# Patient Record
Sex: Male | Born: 1990 | Race: White | Hispanic: No | Marital: Single | Smoking: Current every day smoker
Health system: Southern US, Community
[De-identification: ages and names within clinical notes are randomized; demographics above are authoritative.]

## PROBLEM LIST (undated history)

## (undated) ENCOUNTER — Emergency Department (HOSPITAL_COMMUNITY): Discharge status: Absent without leave | Payer: Medicare Other

## (undated) DIAGNOSIS — F840 Autistic disorder: Secondary | ICD-10-CM

## (undated) DIAGNOSIS — F319 Bipolar disorder, unspecified: Secondary | ICD-10-CM

---

## 2016-07-05 ENCOUNTER — Encounter (HOSPITAL_COMMUNITY): Payer: Self-pay | Admitting: Nurse Practitioner

## 2016-07-05 ENCOUNTER — Emergency Department (HOSPITAL_COMMUNITY)
Admission: EM | Admit: 2016-07-05 | Discharge: 2016-07-05 | Disposition: A | Discharge status: Home / Self Care | Payer: Medicare Other | Attending: Emergency Medicine | Admitting: Emergency Medicine

## 2016-07-05 DIAGNOSIS — R4689 Other symptoms and signs involving appearance and behavior: Secondary | ICD-10-CM

## 2016-07-05 DIAGNOSIS — Z5181 Encounter for therapeutic drug level monitoring: Secondary | ICD-10-CM | POA: Diagnosis not present

## 2016-07-05 DIAGNOSIS — F918 Other conduct disorders: Secondary | ICD-10-CM | POA: Diagnosis present

## 2016-07-05 DIAGNOSIS — F840 Autistic disorder: Secondary | ICD-10-CM | POA: Insufficient documentation

## 2016-07-05 DIAGNOSIS — Z87891 Personal history of nicotine dependence: Secondary | ICD-10-CM | POA: Diagnosis not present

## 2016-07-05 HISTORY — DX: Autistic disorder: F84.0

## 2016-07-05 HISTORY — DX: Bipolar disorder, unspecified: F31.9

## 2016-07-05 LAB — COMPREHENSIVE METABOLIC PANEL
ALT: 56 U/L (ref 17–63)
AST: 56 U/L — ABNORMAL HIGH (ref 15–41)
Albumin: 4.9 g/dL (ref 3.5–5.0)
Alkaline Phosphatase: 72 U/L (ref 38–126)
Anion gap: 9 (ref 5–15)
BUN: 10 mg/dL (ref 6–20)
CHLORIDE: 102 mmol/L (ref 101–111)
CO2: 28 mmol/L (ref 22–32)
CREATININE: 0.89 mg/dL (ref 0.61–1.24)
Calcium: 9.4 mg/dL (ref 8.9–10.3)
Glucose, Bld: 71 mg/dL (ref 65–99)
Potassium: 3.6 mmol/L (ref 3.5–5.1)
Sodium: 139 mmol/L (ref 135–145)
Total Bilirubin: 0.4 mg/dL (ref 0.3–1.2)
Total Protein: 8.1 g/dL (ref 6.5–8.1)

## 2016-07-05 LAB — RAPID URINE DRUG SCREEN, HOSP PERFORMED
AMPHETAMINES: NOT DETECTED
BENZODIAZEPINES: NOT DETECTED
Barbiturates: NOT DETECTED
COCAINE: NOT DETECTED
OPIATES: NOT DETECTED
Tetrahydrocannabinol: NOT DETECTED

## 2016-07-05 LAB — CBC WITH DIFFERENTIAL/PLATELET
BASOS ABS: 0 10*3/uL (ref 0.0–0.1)
BASOS PCT: 0 %
EOS ABS: 0.1 10*3/uL (ref 0.0–0.7)
EOS PCT: 1 %
HCT: 41.2 % (ref 39.0–52.0)
Hemoglobin: 14.1 g/dL (ref 13.0–17.0)
LYMPHS PCT: 27 %
Lymphs Abs: 1.9 10*3/uL (ref 0.7–4.0)
MCH: 29.6 pg (ref 26.0–34.0)
MCHC: 34.2 g/dL (ref 30.0–36.0)
MCV: 86.4 fL (ref 78.0–100.0)
Monocytes Absolute: 0.5 10*3/uL (ref 0.1–1.0)
Monocytes Relative: 7 %
Neutro Abs: 4.5 10*3/uL (ref 1.7–7.7)
Neutrophils Relative %: 65 %
PLATELETS: 353 10*3/uL (ref 150–400)
RBC: 4.77 MIL/uL (ref 4.22–5.81)
RDW: 11.9 % (ref 11.5–15.5)
WBC: 7 10*3/uL (ref 4.0–10.5)

## 2016-07-05 LAB — ETHANOL

## 2016-07-05 NOTE — ED Notes (Signed)
Social work will call group home to see if pt can return there, if he can he will go home. If he can not return to the group home, pt will be reevaluated in the morning
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
## 2016-07-05 NOTE — ED Notes (Signed)
TTS at bedside. 
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
## 2016-07-05 NOTE — Progress Notes (Addendum)
CSW called and spoke to Alexander Fritz at  [phone].  Alexander Fritz is owner/operator at pt's group home [facility] at [address] [place], [place] [id number].  CSW informed Alexander Fritz pt is being D/C'd and after Alexander Fritz re-affirmed the pt's behavior at the home before being admitted, per the notes, Alexander Fritz agreed to pick pt up from the ED before 11pm today, 2/14.  CSW then called the legal guardian DSS agent Lovenia ShuckKim Kelly to update her at ph: [phone].  Legal guardian asked for ED TCU's nurse's phone and thanked the CSW.  CSW will inform the ED TCU RN of the pt's group homes planned arrival.  Please reconsult if future social work needs arise.  7:11 PM CSW called pt's group home manager back, informed him pt's RN has his number and is expecting him before 11pm to pick up the pt.  CSW called ED TCU to let them know pt's contacts number were in this progress note.  CSW signing off.   Dorothe PeaJonathan F. Arthella Fritz, Theresia MajorsLCSWA, LCAS Clinical Social Worker Ph: [phone]

## 2016-07-05 NOTE — Discharge Instructions (Signed)
Please follow-up with your primary care physician as previously scheduled. Please follow-up with your mental health team as directed. If any symptoms worsen, or he began having any thoughts of hurting herself or others, please return to the nearest emergency department.

## 2016-07-05 NOTE — ED Notes (Signed)
Instructions to follow up with PCP and with Mental Health Care team discussed with pt; pt verbalized understanding; pt denies thoughts of harming self or others and understands if these thoughts develop to return to the emergency department; no acute distress noted; staff member from Group Home in lobby to pick up pt

## 2016-07-05 NOTE — Progress Notes (Deleted)
CSW is still assessing to determine pt's group home or family care home.  Pt stated the name was "Alexander Fritz".
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
## 2016-08-25 ENCOUNTER — Emergency Department (HOSPITAL_COMMUNITY)
Admission: EM | Admit: 2016-08-25 | Discharge: 2016-08-26 | Disposition: A | Discharge status: Home / Self Care | Payer: Medicare Other | Attending: Emergency Medicine | Admitting: Emergency Medicine

## 2016-08-25 DIAGNOSIS — F23 Brief psychotic disorder: Secondary | ICD-10-CM | POA: Diagnosis not present

## 2016-08-25 DIAGNOSIS — Z87891 Personal history of nicotine dependence: Secondary | ICD-10-CM | POA: Insufficient documentation

## 2016-08-25 DIAGNOSIS — F319 Bipolar disorder, unspecified: Secondary | ICD-10-CM | POA: Diagnosis not present

## 2016-08-25 DIAGNOSIS — Z79899 Other long term (current) drug therapy: Secondary | ICD-10-CM | POA: Insufficient documentation

## 2016-08-25 DIAGNOSIS — F22 Delusional disorders: Secondary | ICD-10-CM | POA: Diagnosis not present

## 2016-08-25 DIAGNOSIS — R451 Restlessness and agitation: Secondary | ICD-10-CM | POA: Diagnosis present

## 2016-08-25 DIAGNOSIS — E860 Dehydration: Secondary | ICD-10-CM

## 2016-08-25 DIAGNOSIS — F840 Autistic disorder: Secondary | ICD-10-CM | POA: Diagnosis not present

## 2016-08-25 LAB — BASIC METABOLIC PANEL
ANION GAP: 19 — AB (ref 5–15)
BUN: 14 mg/dL (ref 6–20)
CALCIUM: 9.5 mg/dL (ref 8.9–10.3)
CO2: 18 mmol/L — ABNORMAL LOW (ref 22–32)
Chloride: 97 mmol/L — ABNORMAL LOW (ref 101–111)
Creatinine, Ser: 1.07 mg/dL (ref 0.61–1.24)
GLUCOSE: 67 mg/dL (ref 65–99)
POTASSIUM: 3.8 mmol/L (ref 3.5–5.1)
Sodium: 134 mmol/L — ABNORMAL LOW (ref 135–145)

## 2016-08-25 LAB — CBC
HEMATOCRIT: 44.6 % (ref 39.0–52.0)
Hemoglobin: 16 g/dL (ref 13.0–17.0)
MCH: 29.4 pg (ref 26.0–34.0)
MCHC: 35.9 g/dL (ref 30.0–36.0)
MCV: 82 fL (ref 78.0–100.0)
PLATELETS: 369 10*3/uL (ref 150–400)
RBC: 5.44 MIL/uL (ref 4.22–5.81)
RDW: 12.2 % (ref 11.5–15.5)
WBC: 7.3 10*3/uL (ref 4.0–10.5)

## 2016-08-25 LAB — RAPID URINE DRUG SCREEN, HOSP PERFORMED
AMPHETAMINES: NOT DETECTED
BENZODIAZEPINES: NOT DETECTED
Barbiturates: NOT DETECTED
COCAINE: NOT DETECTED
Opiates: NOT DETECTED
Tetrahydrocannabinol: NOT DETECTED

## 2016-08-25 MED ORDER — CARBAMAZEPINE 200 MG PO TABS: 200.0000 mg | ORAL_TABLET | Freq: Two times a day (BID) | ORAL | Status: DC

## 2016-08-25 MED ORDER — SODIUM CHLORIDE 0.9 % IV BOLUS (SEPSIS)
1000.0000 mL | Freq: Once | INTRAVENOUS | Status: AC
  Administered 2016-08-25: 1000 mL via INTRAVENOUS

## 2016-08-25 MED ORDER — RISPERIDONE 2 MG PO TABS: 4.0000 mg | ORAL_TABLET | Freq: Every day | ORAL | Status: DC

## 2016-08-25 MED ORDER — DIVALPROEX SODIUM ER 500 MG PO TB24: 500.0000 mg | ORAL_TABLET | Freq: Two times a day (BID) | ORAL | Status: DC

## 2016-08-25 MED ORDER — TRIHEXYPHENIDYL HCL 5 MG PO TABS
5.0000 mg | ORAL_TABLET | Freq: Two times a day (BID) | ORAL | Status: DC
  Filled 2016-08-25: qty 1
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
## 2016-08-25 NOTE — ED Provider Notes (Signed)
[facility]-EMERGENCY DEPT Provider Note   CSN: [id number] Arrival date & time: 08/25/16  1708     History   Chief Complaint Chief Complaint  Patient presents with  . Medical Clearance  . IVC    HPI Amiir Fritz is a 26 y.o. male.  Patient from group home, hx autism, bipolar disorder, presents with intermittent agitated behavior, say he is the devil, voicing thoughts of harm to self/others, and that he is not taking meds or eating for past week.  Patient is very limited historian, not answering questions - level 5 caveat.    The history is provided by the patient and the police. The history is limited by the condition of the patient.    Past Medical History:  Diagnosis Date  . Autism   . Bipolar 1 disorder (HCC)     There are no active problems to display for this patient.   No past surgical history on file.     Home Medications    Prior to Admission medications   Medication Sig Start Date End Date Taking? Authorizing Provider  carbamazepine (TEGRETOL) 200 MG tablet Take 200 mg by mouth 2 (two) times daily.    Historical Provider, MD  cetirizine (ZYRTEC) 10 MG tablet Take 10 mg by mouth daily.    Historical Provider, MD  divalproex (DEPAKOTE ER) 500 MG 24 hr tablet Take 500 mg by mouth 2 (two) times daily.    Historical Provider, MD  docusate sodium (COLACE) 100 MG capsule Take 100 mg by mouth daily.    Historical Provider, MD  risperidone (RISPERDAL) 4 MG tablet Take 4 mg by mouth at bedtime.    Historical Provider, MD  trihexyphenidyl (ARTANE) 5 MG tablet Take 5 mg by mouth 2 (two) times daily with breakfast and lunch.    Historical Provider, MD  vitamin B-12 (CYANOCOBALAMIN) 1000 MCG tablet Take 1,000 mcg by mouth at bedtime.    Historical Provider, MD    Family History No family history on file.  Social History Social History  Substance Use Topics  . Smoking status: Former Smoker    Packs/day: 0.50    Years: 5.00    Types: Cigarettes    Quit date: 06/23/2015  .  Smokeless tobacco: Former Neurosurgeon  . Alcohol use No     Allergies   Lithium   Review of Systems Review of Systems  Unable to perform ROS: Psychiatric disorder  level 5 caveat, not answering questions, psych disorder.    Physical Exam Updated Vital Signs BP (!) 119/55 (BP Location: Right Arm)   Pulse (!) 142   Temp 98.4 F (36.9 C) (Oral)   Resp (!) 22   Ht  (1.753 m)   Wt 59 kg   SpO2 100%   BMI 19.20 kg/m   Physical Exam  Constitutional: He appears well-developed and well-nourished. No distress.  HENT:  Head: Atraumatic.  Mouth/Throat: Oropharynx is clear and moist.  Eyes: Conjunctivae are normal. Pupils are equal, round, and reactive to light.  Neck: Neck supple. No tracheal deviation present. No thyromegaly present.  Cardiovascular: Normal rate, regular rhythm, normal heart sounds and intact distal pulses.  Exam reveals no gallop and no friction rub.   No murmur heard. Pulmonary/Chest: Effort normal and breath sounds normal. No accessory muscle usage. No respiratory distress.  Abdominal: Soft. Bowel sounds are normal. He exhibits no distension. There is no tenderness.  Musculoskeletal: He exhibits no edema.  Neurological: He is alert.  Speech clear. Ambulates w steady gait. Moves  bil extremities purposefully w good strength.   Skin: Skin is warm and dry. He is not diaphoretic.  Psychiatric: He has a normal mood and affect.  Nursing note and vitals reviewed.    ED Treatments / Results  Labs (all labs ordered are listed, but only abnormal results are displayed) Results for orders placed or performed during the hospital encounter of 08/25/16  CBC  Result Value Ref Range   WBC 7.3 4.0 - 10.5 K/uL   RBC 5.44 4.22 - 5.81 MIL/uL   Hemoglobin 16.0 13.0 - 17.0 g/dL   HCT 40.9 81.1 - 91.4 %   MCV 82.0 78.0 - 100.0 fL   MCH 29.4 26.0 - 34.0 pg   MCHC 35.9 30.0 - 36.0 g/dL   RDW 78.2 95.6 - 21.3 %   Platelets 369 150 - 400 K/uL  Basic metabolic panel  Result  Value Ref Range   Sodium 134 (L) 135 - 145 mmol/L   Potassium 3.8 3.5 - 5.1 mmol/L   Chloride 97 (L) 101 - 111 mmol/L   CO2 18 (L) 22 - 32 mmol/L   Glucose, Bld 67 65 - 99 mg/dL   BUN 14 6 - 20 mg/dL   Creatinine, Ser 0.86 0.61 - 1.24 mg/dL   Calcium 9.5 8.9 - 57.8 mg/dL   GFR calc non Af Amer >60 >60 mL/min   GFR calc Af Amer >60 >60 mL/min   Anion gap 19 (H) 5 - 15  Rapid urine drug screen (hospital performed)  Result Value Ref Range   Opiates NONE DETECTED NONE DETECTED   Cocaine NONE DETECTED NONE DETECTED   Benzodiazepines NONE DETECTED NONE DETECTED   Amphetamines NONE DETECTED NONE DETECTED   Tetrahydrocannabinol NONE DETECTED NONE DETECTED   Barbiturates NONE DETECTED NONE DETECTED    EKG  EKG Interpretation None       Radiology No results found.  Procedures Procedures (including critical care time)  Medications Ordered in ED Medications - No data to display   Initial Impression / Assessment and Plan / ED Course  I have reviewed the triage vital signs and the nursing notes.  Pertinent labs & imaging results that were available during my care of the patient were reviewed by me and considered in my medical decision making (see chart for details).  Labs sent.   [facility] team consulted.  Reviewed nursing notes and prior charts for additional history.   receck poor po intake. hco3 18. Tachy.  Iv ns bolus.   [facility] team recommends inpatient psych tx.   Recheck pt, no new c/o.     Final Clinical Impressions(s) / ED Diagnoses   Final diagnoses:  None    New Prescriptions New Prescriptions   No medications on file     Cathren Laine, MD 08/25/16 2038
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
## 2016-08-26 DIAGNOSIS — Z79899 Other long term (current) drug therapy: Secondary | ICD-10-CM | POA: Diagnosis not present

## 2016-08-26 DIAGNOSIS — Z87891 Personal history of nicotine dependence: Secondary | ICD-10-CM | POA: Diagnosis not present

## 2016-08-26 DIAGNOSIS — F840 Autistic disorder: Secondary | ICD-10-CM | POA: Diagnosis present

## 2016-08-26 DIAGNOSIS — F319 Bipolar disorder, unspecified: Secondary | ICD-10-CM | POA: Diagnosis not present

## 2016-08-26 MED ORDER — RISPERIDONE 2 MG PO TABS: 2.0000 mg | ORAL_TABLET | Freq: Two times a day (BID) | ORAL | Status: DC

## 2016-08-26 MED ORDER — RISPERIDONE 2 MG PO TABS: 2.0000 mg | ORAL_TABLET | Freq: Two times a day (BID) | ORAL | 0 refills | Status: DC

## 2016-08-26 NOTE — Progress Notes (Signed)
CSW contacted by patient's Guardian [name]). CSW informed patient's guardian that patient was psychiatrically cleared and ready for discharge. CSW informed patient's guardian that CSW was unable to reach [name] patient's group home owner. Patient's guardian reported that he had the same phone number for [name] and agreed to try and contact him in regards to patient's discharge.  CSW contacted [name], [name] reported that one of his staff members would be picking patient up by 6:30pm today 08/26/2016.   Celso Sickle, LCSWA [facility]  Clinical Social Worker [phone]

## 2016-08-26 NOTE — [facility]HH Suicide Risk Assessment (Signed)
Suicide Risk Assessment  Discharge Assessment   [facility] Discharge Suicide Risk Assessment   Principal Problem: Bipolar 1 disorder [facility]) Discharge Diagnoses:  Patient Active Problem List   Diagnosis Date Noted  . Autism spectrum disorder [F84.0] 08/26/2016    Priority: High  . Bipolar 1 disorder (HCC) [F31.9] 08/26/2016    Priority: High    Total Time spent with patient: 45 minutes  Musculoskeletal: Strength & Muscle Tone: within normal limits Gait & Station: normal Patient leans: N/A  Psychiatric Specialty Exam: Physical Exam  Constitutional: He is oriented to person, place, and time. He appears well-developed and well-nourished.  HENT:  Head: Normocephalic.  Neck: Normal range of motion.  Respiratory: Effort normal.  Musculoskeletal: Normal range of motion.  Neurological: He is alert and oriented to person, place, and time.  Psychiatric: He has a normal mood and affect. His speech is normal and behavior is normal. Judgment and thought content normal. Cognition and memory are normal.    Review of Systems  All other systems reviewed and are negative.   Blood pressure (!) 105/58, pulse 83, temperature 97.8 F (36.6 C), temperature source Oral, resp. rate 14, height  (1.753 m), weight 59 kg (130 lb), SpO2 97 %.Body mass index is 19.2 kg/m.  General Appearance: Casual  Eye Contact:  Good  Speech:  Normal Rate  Volume:  Normal  Mood:  Euthymic  Affect:  Blunt  Thought Process:  Coherent and Descriptions of Associations: Intact  Orientation:  Full (Time, Place, and Person)  Thought Content:  WDL and Logical  Suicidal Thoughts:  No  Homicidal Thoughts:  No  Memory:  Immediate;   Good Recent;   Good Remote;   Good  Judgement:  Fair  Insight:  Fair  Psychomotor Activity:  Normal  Concentration:  Concentration: Good and Attention Span: Good  Recall:  Good  Fund of Knowledge:  Fair  Language:  Good  Akathisia:  No  Handed:  Right  AIMS (if indicated):     Assets:   Leisure Time Physical Health Resilience Social Support  ADL's:  Intact  Cognition:  WNL  Sleep:      Mental Status Per Nursing Assessment::   On Admission:   aggression at his group home  Demographic Factors:  Male, Adolescent or young adult and Caucasian  Loss Factors: NA  Historical Factors: NA  Risk Reduction Factors:   Sense of responsibility to family, Living with another person, especially a relative and Positive social support  Continued Clinical Symptoms:  None  Cognitive Features That Contribute To Risk:  None    Suicide Risk:  Minimal: No identifiable suicidal ideation.  Patients presenting with no risk factors but with morbid ruminations; may be classified as minimal risk based on the severity of the depressive symptoms    Plan Of Care/Follow-up recommendations:  Activity:  as tolerated Diet:  heart healthy diet  LORD, JAMISON, NP 08/26/2016, 11:19 AM

## 2016-08-26 NOTE — Progress Notes (Signed)
CSW contacted patient's Group Home Owner ([name] [phone]), no answer. CSW left voicemail requesting return phone call.  CSW contacted patient's Guardian [name] [phone]), no answer. CSW left voicemail requesting return phone call.   CSW will continue to try and make contact with patient's group home to discuss patient's dc and transportation.   Celso Sickle, LCSWA [facility]  Clinical Social Worker [phone]

## 2016-08-26 NOTE — BHH Counselor (Signed)
1842:  Attempted to contact Group Home Owner [name] to find out time of pick up for Patient.  [name] reported earlier that he will pick up Patient by 1830 today.  A HIPPA compliant voicemail message was left requesting a return telephone call.

## 2016-08-26 NOTE — Progress Notes (Signed)
CSW filed patient's examination and recommendation paperwork into IVC logbook.  Maggy Wyble, LCSWA [facility]  Clinical Social Worker [phone] 
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
## 2016-09-15 ENCOUNTER — Emergency Department (HOSPITAL_COMMUNITY)
Admission: EM | Admit: 2016-09-15 | Discharge: 2016-09-18 | Disposition: A | Discharge status: Home / Self Care | Payer: Medicare Other | Attending: Emergency Medicine | Admitting: Emergency Medicine

## 2016-09-15 ENCOUNTER — Encounter (HOSPITAL_COMMUNITY): Payer: Self-pay | Admitting: Emergency Medicine

## 2016-09-15 DIAGNOSIS — F840 Autistic disorder: Secondary | ICD-10-CM | POA: Diagnosis present

## 2016-09-15 DIAGNOSIS — F3110 Bipolar disorder, current episode manic without psychotic features, unspecified: Secondary | ICD-10-CM | POA: Diagnosis not present

## 2016-09-15 DIAGNOSIS — Z87891 Personal history of nicotine dependence: Secondary | ICD-10-CM | POA: Insufficient documentation

## 2016-09-15 DIAGNOSIS — F319 Bipolar disorder, unspecified: Secondary | ICD-10-CM | POA: Diagnosis present

## 2016-09-15 DIAGNOSIS — Z79899 Other long term (current) drug therapy: Secondary | ICD-10-CM | POA: Diagnosis not present

## 2016-09-15 DIAGNOSIS — R45850 Homicidal ideations: Secondary | ICD-10-CM | POA: Diagnosis present

## 2016-09-15 LAB — CBC
HEMATOCRIT: 43.5 % (ref 39.0–52.0)
HEMOGLOBIN: 15.2 g/dL (ref 13.0–17.0)
MCH: 29.3 pg (ref 26.0–34.0)
MCHC: 34.9 g/dL (ref 30.0–36.0)
MCV: 83.8 fL (ref 78.0–100.0)
Platelets: 352 10*3/uL (ref 150–400)
RBC: 5.19 MIL/uL (ref 4.22–5.81)
RDW: 12.4 % (ref 11.5–15.5)
WBC: 4.8 10*3/uL (ref 4.0–10.5)

## 2016-09-15 LAB — COMPREHENSIVE METABOLIC PANEL
ALBUMIN: 5.1 g/dL — AB (ref 3.5–5.0)
ALK PHOS: 76 U/L (ref 38–126)
ALT: 18 U/L (ref 17–63)
ANION GAP: 12 (ref 5–15)
AST: 20 U/L (ref 15–41)
BUN: 11 mg/dL (ref 6–20)
CHLORIDE: 101 mmol/L (ref 101–111)
CO2: 25 mmol/L (ref 22–32)
Calcium: 9.7 mg/dL (ref 8.9–10.3)
Creatinine, Ser: 0.81 mg/dL (ref 0.61–1.24)
GFR calc Af Amer: >60 mL/min (ref >60)
GFR calc non Af Amer: >60 mL/min (ref >60)
GLUCOSE: 98 mg/dL (ref 65–99)
Potassium: 3.6 mmol/L (ref 3.5–5.1)
SODIUM: 138 mmol/L (ref 135–145)
Total Bilirubin: 0.9 mg/dL (ref 0.3–1.2)
Total Protein: 8.3 g/dL — ABNORMAL HIGH (ref 6.5–8.1)

## 2016-09-15 LAB — VALPROIC ACID LEVEL

## 2016-09-15 LAB — SALICYLATE LEVEL

## 2016-09-15 LAB — ACETAMINOPHEN LEVEL

## 2016-09-15 LAB — ETHANOL: Alcohol, Ethyl (B): <5 mg/dL (ref <5)

## 2016-09-15 MED ORDER — VITAMIN B-12 1000 MCG PO TABS
1000.0000 ug | ORAL_TABLET | Freq: Every day | ORAL | Status: DC
  Filled 2016-09-15 (×4): qty 1

## 2016-09-15 MED ORDER — RISPERIDONE 2 MG PO TABS
4.0000 mg | ORAL_TABLET | Freq: Every day | ORAL | Status: DC
  Filled 2016-09-15: qty 2

## 2016-09-15 MED ORDER — DIVALPROEX SODIUM ER 500 MG PO TB24
500.0000 mg | ORAL_TABLET | Freq: Two times a day (BID) | ORAL | Status: DC
  Filled 2016-09-15: qty 1

## 2016-09-15 MED ORDER — LORATADINE 10 MG PO TABS
10.0000 mg | ORAL_TABLET | Freq: Every day | ORAL | Status: DC
  Filled 2016-09-15 (×4): qty 1

## 2016-09-15 MED ORDER — RISPERIDONE 2 MG PO TABS
2.0000 mg | ORAL_TABLET | Freq: Two times a day (BID) | ORAL | Status: DC
  Filled 2016-09-15 (×2): qty 1

## 2016-09-15 MED ORDER — TRIHEXYPHENIDYL HCL 5 MG PO TABS
5.0000 mg | ORAL_TABLET | Freq: Two times a day (BID) | ORAL | Status: DC
  Filled 2016-09-15: qty 1

## 2016-09-15 MED ORDER — CARBAMAZEPINE 200 MG PO TABS
200.0000 mg | ORAL_TABLET | Freq: Two times a day (BID) | ORAL | Status: DC
  Filled 2016-09-15 (×2): qty 1
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
## 2016-09-15 NOTE — ED Notes (Signed)
Bed: [facility] Expected date:  Expected time:  Means of arrival:  Comments: 

## 2016-09-16 ENCOUNTER — Encounter (HOSPITAL_COMMUNITY): Payer: Self-pay | Admitting: Registered Nurse

## 2016-09-16 DIAGNOSIS — F3110 Bipolar disorder, current episode manic without psychotic features, unspecified: Secondary | ICD-10-CM | POA: Diagnosis not present

## 2016-09-16 DIAGNOSIS — F840 Autistic disorder: Secondary | ICD-10-CM | POA: Diagnosis not present

## 2016-09-16 DIAGNOSIS — Z87891 Personal history of nicotine dependence: Secondary | ICD-10-CM | POA: Diagnosis not present

## 2016-09-16 DIAGNOSIS — F319 Bipolar disorder, unspecified: Secondary | ICD-10-CM

## 2016-09-16 MED ORDER — DIPHENHYDRAMINE HCL 50 MG/ML IJ SOLN
25.0000 mg | Freq: Once | INTRAMUSCULAR | Status: AC
  Administered 2016-09-16: 25 mg via INTRAMUSCULAR
  Filled 2016-09-16: qty 1
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
## 2016-09-16 NOTE — ED Notes (Signed)
Patient with complaints of anxiety and is requesting to receive Benadryl IM. MD aware; orders completed. Pt tolerated injection well; no distress noted.

## 2016-09-17 DIAGNOSIS — F319 Bipolar disorder, unspecified: Secondary | ICD-10-CM | POA: Diagnosis not present

## 2016-09-17 DIAGNOSIS — Z87891 Personal history of nicotine dependence: Secondary | ICD-10-CM | POA: Diagnosis not present

## 2016-09-17 DIAGNOSIS — F840 Autistic disorder: Secondary | ICD-10-CM | POA: Diagnosis not present

## 2016-09-17 DIAGNOSIS — F3110 Bipolar disorder, current episode manic without psychotic features, unspecified: Secondary | ICD-10-CM | POA: Diagnosis not present

## 2016-09-17 LAB — LIPID PANEL
CHOLESTEROL: 133 mg/dL (ref 0–200)
HDL: 40 mg/dL — ABNORMAL LOW (ref >40)
LDL Cholesterol: 71 mg/dL (ref 0–99)
Total CHOL/HDL Ratio: 3.3 RATIO
Triglycerides: 112 mg/dL (ref <150)
VLDL: 22 mg/dL (ref 0–40)
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
## 2016-09-17 NOTE — ED Notes (Signed)
Patient resting in bed watching television at this time. No signs of distress noted currently.

## 2016-09-18 DIAGNOSIS — Z87891 Personal history of nicotine dependence: Secondary | ICD-10-CM | POA: Diagnosis not present

## 2016-09-18 DIAGNOSIS — F840 Autistic disorder: Secondary | ICD-10-CM | POA: Diagnosis not present

## 2016-09-18 DIAGNOSIS — F319 Bipolar disorder, unspecified: Secondary | ICD-10-CM | POA: Diagnosis not present

## 2016-09-18 LAB — HEMOGLOBIN A1C
HEMOGLOBIN A1C: 4.6 % — AB (ref 4.8–5.6)
MEAN PLASMA GLUCOSE: 85 mg/dL

## 2016-09-18 LAB — PROLACTIN: Prolactin: 5.8 ng/mL (ref 4.0–15.2)

## 2016-09-18 MED ORDER — CARBAMAZEPINE 200 MG PO TABS: 200.0000 mg | ORAL_TABLET | Freq: Two times a day (BID) | ORAL | 0 refills | Status: DC

## 2016-09-18 MED ORDER — TRIHEXYPHENIDYL HCL 5 MG PO TABS: 5.0000 mg | ORAL_TABLET | Freq: Two times a day (BID) | ORAL | 0 refills | Status: DC

## 2016-09-18 MED ORDER — RISPERIDONE 2 MG PO TABS: 2.0000 mg | ORAL_TABLET | Freq: Two times a day (BID) | ORAL | 0 refills | Status: DC

## 2016-09-18 MED ORDER — LORAZEPAM 1 MG PO TABS
1.0000 mg | ORAL_TABLET | Freq: Once | ORAL | Status: DC
  Filled 2016-09-18: qty 1

## 2016-09-18 NOTE — ED Notes (Signed)
Patient has previously refused all medications.  Patient was asked this morning if he wanted to take any of his medications and he continues to refuse.

## 2016-09-18 NOTE — Progress Notes (Signed)
CSW spokw with patients group home owner, [name] of [facility] of Care at [phone], regarding patient returning to group home today 4/30 at discharge. CSW updated [name] that patient is medically/psych cleared at this time. [name] stated patient is able to return to group home and employee by the name of Gregary Signs will pick up patient around 4:30PM this afternoon. [name] asked CSW to inform patient that he would be returning to group home. CSW will update patient and legal guardian.   Stacy Gardner, LCSWA Clinical Social Worker [phone]

## 2016-09-18 NOTE — Progress Notes (Signed)
CSW spoke with patient via bedside regarding discharge plans. Patient stated he did not want to go back to [facility] of Care. When CSW questioned why patient did not want to return patient stated "they have too many fights there with other people". CSW informed patient this was his only option at this time and he could make other arrangements with legal guardian once discharged. Patient stated "I am not going back there".   CSW contacted patients legal guardian, [name] [phone], to inform him that patient is refusing to DC with group home. Mr. Ledon Snare stated group home will need to pick patient up and if patient refusing at that time patient is able to discharge to homeless shelter. CSW spoke with social work Chiropodist to confirm this discharge plan. AD stated patient would need to be discharged back to group home and discharge to homeless shelter is not an option at this time.   CSW updated [name] of conversation with patient and employee of group home will still arrive around 4:30PM. CSW also updated patients guardian and requested he speak with patient to tell patient his only option is to return to group home.  CSW will update RN of plan.   Stacy Gardner, LCSWA Clinical Social Worker [phone]
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
## 2016-09-18 NOTE — ED Notes (Signed)
Pt returned to group home via group home staff. Pt escorted out of lobby, accompanied by security and hospital administrators.
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
## 2016-09-18 NOTE — ED Notes (Signed)
Pt has been agitated and emotional regarding his refusal to leave ED and return to Group Home.  This is likely cause of elevated Pulse and BP.  Will recheck when Pt has settled down.
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
## 2016-09-19 NOTE — B[facility] Suicide Risk Assessment (Addendum)
Suicide Risk Assessment  Discharge Assessment   [facility] Discharge Suicide Risk Assessment   Principal Problem: Bipolar 1 disorder [facility]) Discharge Diagnoses:  Patient Active Problem List   Diagnosis Date Noted  . Autism spectrum disorder [F84.0] 08/26/2016    Priority: High  . Bipolar 1 disorder (HCC) [F31.9] 08/26/2016    Priority: High    Total Time spent with patient: 30 minutes  Musculoskeletal: Strength & Muscle Tone: within normal limits Gait & Station: normal Patient leans: N/A  Psychiatric Specialty Exam: Physical Exam  Constitutional: He is oriented to person, place, and time. He appears well-developed and well-nourished.  HENT:  Head: Normocephalic.  Neck: Normal range of motion.  Respiratory: Effort normal.  Musculoskeletal: Normal range of motion.  Neurological: He is alert and oriented to person, place, and time.  Psychiatric: He has a normal mood and affect. His speech is normal and behavior is normal. Judgment and thought content normal. Cognition and memory are normal.    Review of Systems  Psychiatric/Behavioral: Negative.   All other systems reviewed and are negative.   Blood pressure 129/78, pulse 76, temperature 98.6 F (37 C), temperature source Oral, resp. rate 18, SpO2 100 %.There is no height or weight on file to calculate BMI.  General Appearance: Casual  Eye Contact:  Good  Speech:  Normal Rate  Volume:  Normal  Mood:  Euthymic  Affect:  Congruent  Thought Process:  Coherent and Descriptions of Associations: Intact  Orientation:  Full (Time, Place, and Person)  Thought Content:  WDL and Logical  Suicidal Thoughts:  No  Homicidal Thoughts:  No  Memory:  Immediate;   Good Recent;   Good Remote;   Good  Judgement:  Fair  Insight:  Fair  Psychomotor Activity:  Normal  Concentration:  Concentration: Good and Attention Span: Good  Recall:  Good  Fund of Knowledge:  Fair  Language:  Good  Akathisia:  No  Handed:  Right  AIMS (if indicated):      Assets:  Housing Leisure Time Physical Health Resilience Social Support  ADL's:  Intact  Cognition:  WNL  Sleep:      Mental Status Per Nursing Assessment::   On Admission:   aggression  Demographic Factors:  Male and Caucasian  Loss Factors: NA  Historical Factors: Impulsivity  Risk Reduction Factors:   Living with another person, especially a relative, Positive social support and Positive therapeutic relationship  Continued Clinical Symptoms:  None  Cognitive Features That Contribute To Risk:  None    Suicide Risk:  Minimal: No identifiable suicidal ideation.  Patients presenting with no risk factors but with morbid ruminations; may be classified as minimal risk based on the severity of the depressive symptoms    Plan Of Care/Follow-up recommendations:  Activity:  as tolerated Diet:  heart healhty diet  Diane Hanel, NP 09/19/2016, 9:40 AM

## 2016-10-04 ENCOUNTER — Emergency Department (HOSPITAL_COMMUNITY)
Admission: EM | Admit: 2016-10-04 | Discharge: 2016-10-09 | Disposition: A | Discharge status: Home / Self Care | Payer: Medicare Other | Attending: Emergency Medicine | Admitting: Emergency Medicine

## 2016-10-04 ENCOUNTER — Encounter (HOSPITAL_COMMUNITY): Payer: Self-pay | Admitting: Family Medicine

## 2016-10-04 ENCOUNTER — Emergency Department (HOSPITAL_COMMUNITY): Payer: Medicare Other

## 2016-10-04 DIAGNOSIS — F3113 Bipolar disorder, current episode manic without psychotic features, severe: Secondary | ICD-10-CM | POA: Diagnosis not present

## 2016-10-04 DIAGNOSIS — F319 Bipolar disorder, unspecified: Secondary | ICD-10-CM

## 2016-10-04 DIAGNOSIS — F3110 Bipolar disorder, current episode manic without psychotic features, unspecified: Secondary | ICD-10-CM | POA: Insufficient documentation

## 2016-10-04 DIAGNOSIS — F3112 Bipolar disorder, current episode manic without psychotic features, moderate: Secondary | ICD-10-CM

## 2016-10-04 DIAGNOSIS — F840 Autistic disorder: Secondary | ICD-10-CM | POA: Diagnosis not present

## 2016-10-04 DIAGNOSIS — R45851 Suicidal ideations: Secondary | ICD-10-CM | POA: Diagnosis not present

## 2016-10-04 DIAGNOSIS — F250 Schizoaffective disorder, bipolar type: Secondary | ICD-10-CM | POA: Diagnosis present

## 2016-10-04 DIAGNOSIS — Z87891 Personal history of nicotine dependence: Secondary | ICD-10-CM | POA: Insufficient documentation

## 2016-10-04 DIAGNOSIS — Z79899 Other long term (current) drug therapy: Secondary | ICD-10-CM | POA: Insufficient documentation

## 2016-10-04 DIAGNOSIS — F312 Bipolar disorder, current episode manic severe with psychotic features: Secondary | ICD-10-CM | POA: Diagnosis not present

## 2016-10-04 DIAGNOSIS — R45850 Homicidal ideations: Secondary | ICD-10-CM | POA: Diagnosis not present

## 2016-10-04 LAB — COMPREHENSIVE METABOLIC PANEL
ALT: 20 U/L (ref 17–63)
AST: 23 U/L (ref 15–41)
Albumin: 5.1 g/dL — ABNORMAL HIGH (ref 3.5–5.0)
Alkaline Phosphatase: 90 U/L (ref 38–126)
Anion gap: 10 (ref 5–15)
BUN: 16 mg/dL (ref 6–20)
CO2: 24 mmol/L (ref 22–32)
Calcium: 9.7 mg/dL (ref 8.9–10.3)
Chloride: 106 mmol/L (ref 101–111)
Creatinine, Ser: 0.77 mg/dL (ref 0.61–1.24)
GFR calc Af Amer: >60 mL/min (ref >60)
GFR calc non Af Amer: >60 mL/min (ref >60)
Glucose, Bld: 99 mg/dL (ref 65–99)
Potassium: 3.9 mmol/L (ref 3.5–5.1)
Sodium: 140 mmol/L (ref 135–145)
Total Bilirubin: 0.7 mg/dL (ref 0.3–1.2)
Total Protein: 8.3 g/dL — ABNORMAL HIGH (ref 6.5–8.1)

## 2016-10-04 LAB — RAPID URINE DRUG SCREEN, HOSP PERFORMED
Amphetamines: NOT DETECTED
Barbiturates: NOT DETECTED
Benzodiazepines: NOT DETECTED
Cocaine: NOT DETECTED
Opiates: NOT DETECTED
Tetrahydrocannabinol: NOT DETECTED

## 2016-10-04 LAB — SALICYLATE LEVEL: Salicylate Lvl: <7 mg/dL (ref 2.8–30.0)

## 2016-10-04 LAB — ETHANOL: Alcohol, Ethyl (B): <5 mg/dL (ref <5)

## 2016-10-04 LAB — CBC
HCT: 41.9 % (ref 39.0–52.0)
Hemoglobin: 14.5 g/dL (ref 13.0–17.0)
MCH: 29.1 pg (ref 26.0–34.0)
MCHC: 34.6 g/dL (ref 30.0–36.0)
MCV: 84.1 fL (ref 78.0–100.0)
Platelets: 360 10*3/uL (ref 150–400)
RBC: 4.98 MIL/uL (ref 4.22–5.81)
RDW: 12.4 % (ref 11.5–15.5)
WBC: 14 10*3/uL — ABNORMAL HIGH (ref 4.0–10.5)

## 2016-10-04 LAB — I-STAT CG4 LACTIC ACID, ED: Lactic Acid, Venous: 1.15 mmol/L (ref 0.5–1.9)

## 2016-10-04 LAB — ACETAMINOPHEN LEVEL: Acetaminophen (Tylenol), Serum: <10 ug/mL — ABNORMAL LOW (ref 10–30)

## 2016-10-04 MED ORDER — ACETAMINOPHEN 500 MG PO TABS: 1000.0000 mg | ORAL_TABLET | Freq: Once | ORAL | Status: DC

## 2016-10-04 MED ORDER — SODIUM CHLORIDE 0.9 % IV BOLUS (SEPSIS): 1000.0000 mL | Freq: Once | INTRAVENOUS | Status: DC

## 2016-10-04 MED ORDER — ACETAMINOPHEN 500 MG PO TABS: 1000.0000 mg | ORAL_TABLET | Freq: Three times a day (TID) | ORAL | Status: DC | PRN

## 2016-10-04 NOTE — ED Notes (Signed)
Provider at bedside

## 2016-10-04 NOTE — ED Notes (Signed)
Pt transported to radiology.
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
## 2016-10-05 DIAGNOSIS — F3110 Bipolar disorder, current episode manic without psychotic features, unspecified: Secondary | ICD-10-CM | POA: Diagnosis not present

## 2016-10-05 DIAGNOSIS — F250 Schizoaffective disorder, bipolar type: Secondary | ICD-10-CM | POA: Diagnosis present

## 2016-10-05 NOTE — BH Assessment (Addendum)
[facility] Assessment Progress Note  Per Thedore MinsMojeed Akintayo, MD, this pt requires psychiatric hospitalization at this time.  Pt presents under IVC initiated by Dr Jannifer FranklinAkintayo.  The following facilities have been contacted to seek placement for this pt, with results as noted:  Beds available, information sent, decision pending:  [facility]   [facility]   Vira [place], [place] Triage Specialist [phone]
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
## 2016-10-06 DIAGNOSIS — F312 Bipolar disorder, current episode manic severe with psychotic features: Secondary | ICD-10-CM | POA: Diagnosis not present

## 2016-10-06 DIAGNOSIS — Z87891 Personal history of nicotine dependence: Secondary | ICD-10-CM | POA: Diagnosis not present

## 2016-10-06 DIAGNOSIS — R45850 Homicidal ideations: Secondary | ICD-10-CM | POA: Diagnosis not present

## 2016-10-06 DIAGNOSIS — R45851 Suicidal ideations: Secondary | ICD-10-CM | POA: Diagnosis not present
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
## 2016-10-06 NOTE — ED Notes (Signed)
[facility]
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
## 2016-10-06 NOTE — ED Notes (Signed)
Patient doing push ups in room

## 2016-10-06 NOTE — ED Provider Notes (Signed)
26 year old male history of autism, bipolar disorder, from group home who is here involuntarily committed. Dr. Jannifer FranklinAkintayo, on for psychiatry, call [facility] that I give a second opinion the patient needs to take medications. Patient states that he is homicidal and would harm someone if he was not in here. Agree with the patient's need to take medications and patient's involuntary commitment.   Margarita Grizzleay, Alexander Coston, MD 10/06/16 1154

## 2016-10-06 NOTE — ED Notes (Signed)
Called [facility] to give report was left on hold for 10 minutes without anyone coming back to phone to finish getting report.  This RN hung up phone and walking back to [facility] to give report in person.

## 2016-10-06 NOTE — ED Notes (Addendum)
Patient coming to doorway stating, "When I get out of here I am going to kill somebody!" This RN asked patient if he had someone specific that he wanted to harm.  Patient responded, "If they send me back to the group home, I am going to kill someone."  This RN explained to patient that plan at this time is patient not returning back to group home (due to awaiting inpatient treatment facility acceptance).  Security and [employer] made aware and are at standby
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
## 2016-10-06 NOTE — BH Assessment (Signed)
[facility] Assessment Progress Note  Per Thedore MinsMojeed Akintayo, MD, this pt continues to require psychiatric hospitalization at this time.  The following facilities have been contacted to seek placement for this pt, with results as noted:  Beds available, information sent, decision pending[facility]   Declined:  [facility] (due to [facility]) [facility] to [facility])   [facility] capacity:  [facility]   Jemia Fata, [place] Triage Specialist [phone]

## 2016-10-07 DIAGNOSIS — F840 Autistic disorder: Secondary | ICD-10-CM

## 2016-10-07 DIAGNOSIS — R45850 Homicidal ideations: Secondary | ICD-10-CM

## 2016-10-07 DIAGNOSIS — F3113 Bipolar disorder, current episode manic without psychotic features, severe: Secondary | ICD-10-CM | POA: Diagnosis not present

## 2016-10-07 DIAGNOSIS — F3110 Bipolar disorder, current episode manic without psychotic features, unspecified: Secondary | ICD-10-CM | POA: Diagnosis not present

## 2016-10-07 DIAGNOSIS — Z87891 Personal history of nicotine dependence: Secondary | ICD-10-CM

## 2016-10-07 MED ORDER — STERILE WATER FOR INJECTION IJ SOLN
INTRAMUSCULAR | Status: AC
  Administered 2016-10-07: 2.1 mL
  Filled 2016-10-07: qty 10

## 2016-10-07 MED ORDER — STERILE WATER FOR INJECTION IJ SOLN
INTRAMUSCULAR | Status: AC
  Administered 2016-10-07: 10 mL
  Filled 2016-10-07: qty 10

## 2016-10-07 MED ORDER — OLANZAPINE 10 MG IM SOLR
10.0000 mg | Freq: Two times a day (BID) | INTRAMUSCULAR | Status: DC
  Administered 2016-10-07 (×2): 10 mg via INTRAMUSCULAR
  Filled 2016-10-07 (×2): qty 10

## 2016-10-07 MED ORDER — DIPHENHYDRAMINE HCL 50 MG/ML IJ SOLN
50.0000 mg | Freq: Two times a day (BID) | INTRAMUSCULAR | Status: DC
  Administered 2016-10-07 – 2016-10-09 (×4): 50 mg via INTRAMUSCULAR
  Filled 2016-10-07 (×4): qty 1
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
## 2016-10-07 NOTE — Progress Notes (Signed)
CSW contacted the following facilities to inquire about patient's referral:  [facility] - Staff member Fonda KinderMakayla reported that she did not see patient's referral and requested that CSW resend patient's referral. CSW faxed patient's referral.   [facility] - Staff member August SaucerDean reported that there assessment team is currently busy and requested that CSW call back later.   [facility] - Staff member Tawanna Coolerodd reported that patient was declined due to aggression   Unable to [facility]  [facility]  [facility]  [facility]  [place] [facility]   Celso SickleKimberly Katera Rybka, [place] [facility]  Clinical Social Worker [phone]

## 2016-10-07 NOTE — [facility] Counselor (Signed)
0553-Received call from [facility].  Patient is under review.    Elmore GuiseJaniah Collen Hostler, LPCA & LCAS Therapeutic Triage Specialist
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
## 2016-10-08 DIAGNOSIS — F3110 Bipolar disorder, current episode manic without psychotic features, unspecified: Secondary | ICD-10-CM | POA: Diagnosis not present

## 2016-10-08 DIAGNOSIS — Z87891 Personal history of nicotine dependence: Secondary | ICD-10-CM | POA: Diagnosis not present

## 2016-10-08 DIAGNOSIS — F312 Bipolar disorder, current episode manic severe with psychotic features: Secondary | ICD-10-CM | POA: Diagnosis not present

## 2016-10-08 DIAGNOSIS — R45850 Homicidal ideations: Secondary | ICD-10-CM | POA: Diagnosis not present

## 2016-10-08 MED ORDER — RISPERIDONE 1 MG/ML PO SOLN: 1.0000 mg | Freq: Every day | ORAL | Status: DC

## 2016-10-08 MED ORDER — BENZTROPINE MESYLATE 1 MG/ML IJ SOLN
0.5000 mg | Freq: Two times a day (BID) | INTRAMUSCULAR | Status: DC
  Administered 2016-10-08 – 2016-10-09 (×3): 0.5 mg via INTRAMUSCULAR
  Filled 2016-10-08 (×3): qty 2

## 2016-10-08 MED ORDER — HALOPERIDOL LACTATE 5 MG/ML IJ SOLN
2.0000 mg | Freq: Two times a day (BID) | INTRAMUSCULAR | Status: DC
  Administered 2016-10-08 (×2): 2 mg via INTRAMUSCULAR
  Filled 2016-10-08 (×2): qty 1

## 2016-10-08 NOTE — Progress Notes (Signed)
CSW contacted by staff member Lanora Manislizabeth [facility] [phone]), staff asked if patient still needed placement. CSW reported that patient still needed placement, Staff reported that they are at capacity and that CSW can contact [facility] on Monday to inquire about bed availability.   Alexander Fritz, LCSWA [facility]  Clinical Social Worker [phone]
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
## 2016-10-08 NOTE — ED Notes (Signed)
Patient approaching this RN and requesting snacks, which were given. Pt pleasant and cooperative with care a this time. No signs of distress noted currently. Pt denies any thoughts of hurting himself or others at this time.

## 2016-10-09 DIAGNOSIS — Z87891 Personal history of nicotine dependence: Secondary | ICD-10-CM | POA: Diagnosis not present

## 2016-10-09 DIAGNOSIS — F312 Bipolar disorder, current episode manic severe with psychotic features: Secondary | ICD-10-CM | POA: Diagnosis not present

## 2016-10-09 DIAGNOSIS — F3110 Bipolar disorder, current episode manic without psychotic features, unspecified: Secondary | ICD-10-CM | POA: Diagnosis not present

## 2016-10-09 MED ORDER — HYDROXYZINE HCL 25 MG PO TABS: 25.0000 mg | ORAL_TABLET | Freq: Two times a day (BID) | ORAL | Status: DC | PRN

## 2016-10-09 MED ORDER — DIPHENHYDRAMINE HCL 50 MG/ML IJ SOLN: 50.0000 mg | Freq: Once | INTRAMUSCULAR | Status: DC

## 2016-10-09 MED ORDER — HYDROXYZINE HCL 25 MG PO TABS: 25.0000 mg | ORAL_TABLET | Freq: Two times a day (BID) | ORAL | 0 refills | Status: DC | PRN

## 2016-10-09 MED ORDER — HALOPERIDOL DECANOATE 100 MG/ML IM SOLN: 50.0000 mg | INTRAMUSCULAR | 0 refills | Status: DC

## 2016-10-09 MED ORDER — BENZTROPINE MESYLATE 1 MG/ML IJ SOLN
0.5000 mg | Freq: Once | INTRAMUSCULAR | Status: DC
Start: 2016-10-09 — End: 2016-10-09

## 2016-10-09 MED ORDER — HALOPERIDOL DECANOATE 100 MG/ML IM SOLN
50.0000 mg | INTRAMUSCULAR | Status: DC
  Administered 2016-10-09: 50 mg via INTRAMUSCULAR
  Filled 2016-10-09: qty 0.5

## 2016-10-09 NOTE — Progress Notes (Signed)
CSW spoke with Estell HarpinSean Hicks, owner of [facility] at [phone]. Group Home is able to take patient back today. Mr. [name] asked questions regarding medication change. CSW informed Mr. [name] patient has been taking medications/ injection. Mr. [name] is not able to pick patient up until 6:00PM this evening 5/21. CSW will update legal guardian of discharge.   Stacy GardnerErin Yanin Muhlestein, LCSWA Clinical Social Worker [phone]

## 2016-10-09 NOTE — ED Notes (Signed)
Pt discharged to his group home. He went willingly. All belongings were returned.

## 2016-10-09 NOTE — [facility] Suicide Risk Assessment (Signed)
Suicide Risk Assessment  Discharge Assessment   [facility]   Principal Problem: Bipolar affective disorder, current episode manic [facility](HCC) Discharge Diagnoses:  Patient Active Problem List   Diagnosis Date Noted  . Bipolar affective disorder, current episode manic (HCC) [F31.9] 10/05/2016    Priority: High  . Autism spectrum disorder [F84.0] 08/26/2016    Priority: High    Total Time spent with patient: 30 minutes  Musculoskeletal: Strength & Muscle Tone: within normal limits Gait & Station: normal Patient leans: N/A  Psychiatric Specialty Exam:   Blood pressure 127/81, pulse 78, temperature 98.4 F (36.9 C), temperature source Oral, resp. rate 16, height 5\' 10"  (1.778 m), weight 64.9 kg (143 lb), SpO2 100 %.Body mass index is 20.52 kg/m.  General Appearance: Casual  Eye Contact::  Good  Speech:  Slow409  Volume:  Normal  Mood:  Euthymic  Affect:  Blunt  Thought Process:  Coherent and Descriptions of Associations: Intact  Orientation:  Full (Time, Place, and Person)  Thought Content:  WDL and Logical  Suicidal Thoughts:  No  Homicidal Thoughts:  No  Memory:  Immediate;   Fair Recent;   Fair Remote;   Fair  Judgement:  Fair  Insight:  Fair  Psychomotor Activity:  Normal  Concentration:  Good  Recall:  Good  Fund of Knowledge:Fair  Language: Good  Akathisia:  No  Handed:  Right  AIMS (if indicated):     Assets:  Housing Leisure Time Physical Health Resilience Social Support  Sleep:     Cognition: WNL  ADL's:  Intact   Mental Status Per Nursing Assessment::   On Admission:   homicidal ideations and aggression.  Medications were started in the ED along with a long-term injectable, Haldol deconate.  No suicidal/homicidal ideations, hallucinations, or alcohol/drug issues.  Stable to return to his group home.  Demographic Factors:  Male, Adolescent or young adult and Caucasian  Loss Factors: NA  Historical  Factors: Impulsivity  Risk Reduction Factors:   Sense of responsibility to family, Living with another person, especially a relative, Positive social support and Positive therapeutic relationship  Continued Clinical Symptoms:  None  Cognitive Features That Contribute To Risk:  None    Suicide Risk:  Minimal: No identifiable suicidal ideation.  Patients presenting with no risk factors but with morbid ruminations; may be classified as minimal risk based on the severity of the depressive symptoms    Plan Of Care/Follow-up recommendations:  Activity:  as tolerated Diet:  heart healthy diet  LORD, JAMISON, NP 10/09/2016, 11:09 AM

## 2016-10-09 NOTE — Progress Notes (Signed)
CSW was advised by daytime ED CSW pt's group home will pick pt up at approx 6pm.  CSW [facility] and asked Gregary SignsSean to arrive to [facility] B entrance to pick up group home after consulting the pt's RN, CN, security and [employer].  Pt was walked from [facility] by RN, security, [employer] to RES B entrance and left with group home.  Please reconsult if future social work needs arise.    Dorothe PeaJonathan F. Innocence Schlotzhauer, Theresia MajorsLCSWA, LCAS Clinical Social Worker Ph: [phone]

## 2016-10-09 NOTE — Progress Notes (Signed)
CSW [facility] who will be arriving approx 6:30 pm on 5/21 to pick pt up.  CSW will continue to follow for D/C needs.  Dorothe PeaJonathan F. Hira Trent, Theresia MajorsLCSWA, LCAS Clinical Social Worker Ph: [phone]

## 2016-10-09 NOTE — Consult Note (Addendum)
[facility] Progress [facility]  10/09/2016 10:54 AM Alexander Fritz  MRN:  [id number] Subjective:  "I am ready to go back to my group home. I am doing much better since I started got injection.''  Objective: Patient was interviewed, chart reviewed and case discussed with treatment team. Patient reports that he is no longer suicidal or homicidal. Also, he denies delusions, psychosis or depression but still appears anxious and fidgety. Patient continue to refuse oral medication but willing to taking monthly injectable.  Principal Problem: Bipolar affective disorder, current episode manic (HCC) Diagnosis:   Patient Active Problem List   Diagnosis Date Noted  . Bipolar affective disorder, current episode manic (HCC) [F31.9] 10/05/2016    Priority: High  . Autism spectrum disorder [F84.0] 08/26/2016    Priority: High   Total Time spent with patient: 25 minutes  Past Psychiatric History: as above  Past Medical History:  Past Medical History:  Diagnosis Date  . Autism   . Bipolar 1 disorder (HCC)    History reviewed. No pertinent surgical history. Family History: History reviewed. No pertinent family history. Family Psychiatric  History:  Social History:  History  Alcohol Use No     History  Drug Use No    Social History   Social History  . Marital status: Unknown    Spouse name: N/A  . Number of children: N/A  . Years of education: N/A   Social History Main Topics  . Smoking status: Former Smoker    Packs/day: 0.50    Years: 5.00    Types: Cigarettes    Quit date: 06/23/2015  . Smokeless tobacco: Former NeurosurgeonUser  . Alcohol use No  . Drug use: No  . Sexual activity: No   Other Topics Concern  . None   Social History Narrative  . None    Sleep: Fair  Appetite:  Fair  Current Medications: Current Facility-Administered Medications  Medication Dose Route Frequency Provider Last Rate Last Dose  . acetaminophen (TYLENOL) tablet 1,000 mg  1,000 mg Oral Q8H PRN Antony MaduraHumes, Kelly,  PA-C      . benztropine mesylate (COGENTIN) injection 0.5 mg  0.5 mg Intramuscular BID Tinita Brooker, MD   0.5 mg at 10/08/16 2044  . diphenhydrAMINE (BENADRYL) injection 50 mg  50 mg Intramuscular BID Charm RingsLord, Jamison Y, NP   50 mg at 10/08/16 2045  . haloperidol decanoate (HALDOL DECANOATE) 100 MG/ML injection 50 mg  50 mg Intramuscular Q30 days Myracle Febres, MD      . haloperidol lactate (HALDOL) injection 2 mg  2 mg Intramuscular BID Thedore MinsAkintayo, Tonisha Silvey, MD   2 mg at 10/08/16 2045   Current Outpatient Prescriptions  Medication Sig Dispense Refill  . carbamazepine (TEGRETOL) 200 MG tablet Take 1 tablet (200 mg total) by mouth 2 (two) times daily. (0800 & 2000) (Patient not taking: Reported on 10/04/2016) 60 tablet 0  . cetirizine (ZYRTEC) 10 MG tablet Take 10 mg by mouth daily. (0800)    . divalproex (DEPAKOTE ER) 500 MG 24 hr tablet Take 500 mg by mouth 2 (two) times daily. (0800 & 2000)    . docusate sodium (COLACE) 100 MG capsule Take 100 mg by mouth every morning.     . risperiDONE (RISPERDAL) 2 MG tablet Take 1 tablet (2 mg total) by mouth 2 (two) times daily. (Patient not taking: Reported on 10/04/2016) 60 tablet 0  . risperidone (RISPERDAL) 4 MG tablet Take 4 mg by mouth at bedtime. (2000)    . trihexyphenidyl (ARTANE) 5  MG tablet Take 1 tablet (5 mg total) by mouth 2 (two) times daily with breakfast and lunch. (0800 & 2000) (Patient not taking: Reported on 10/04/2016) 60 tablet 0  . vitamin B-12 (CYANOCOBALAMIN) 1000 MCG tablet Take 1,000 mcg by mouth at bedtime. (2000)      Lab Results: No results found for this or any previous visit (from the past 48 hour(s)).  Blood Alcohol level:  Lab Results  Component Value Date   ETH <5 10/04/2016   ETH <5 09/15/2016    Physical Findings: AIMS:  , ,  ,  ,    CIWA:    COWS:     Musculoskeletal: Strength & Muscle Tone: within normal limits Gait & Station: normal Patient leans: N/A  Psychiatric Specialty Exam: Physical Exam   Psychiatric: He has a normal mood and affect. His behavior is normal. Judgment and thought content normal. His speech is delayed. Cognition and memory are normal.    Review of Systems  Constitutional: Negative.   HENT: Negative.   Eyes: Negative.   Respiratory: Negative.   Cardiovascular: Negative.   Gastrointestinal: Negative.   Genitourinary: Negative.   Musculoskeletal: Negative.   Skin: Negative.   Neurological: Negative.   Endo/Heme/Allergies: Negative.   Psychiatric/Behavioral: The patient is nervous/anxious.     Blood pressure 127/81, pulse 78, temperature 98.4 F (36.9 C), temperature source Oral, resp. rate 16, height 5\' 10"  (1.778 m), weight 64.9 kg (143 lb), SpO2 100 %.Body mass index is 20.52 kg/m.  General Appearance: Casual  Eye Contact:  Minimal  Speech:  Pressured  Volume:  Normal  Mood:  flat  Affect:  Appropriate  Thought Process:  Disorganized  Orientation:  Full (Time, Place, and Person)  Thought Content:  Logical  Suicidal Thoughts:  No  Homicidal Thoughts:  No  Memory:  Immediate;   Fair Recent;   Fair Remote;   Fair  Judgement:  Other:  marginal  Insight:  marginal  Psychomotor Activity:  Normal  Concentration:  Concentration: Fair and Attention Span: Fair  Recall:  [employer] of Knowledge:  Fair  Language:  Good  Akathisia:  No  Handed:  Right  AIMS (if indicated):     Assets:  Communication Skills  ADL's:  Intact  Cognition:  WNL  Sleep:   fair      Treatment Plan Summary: Diagnosis: Schizoaffective disorder-Bipolar type -Start Haldol decanoate 50 mg Im q 30 days, Ist dose today-Patient refused oral medications. -Discontinue  Haldol 2mg  bid IM and Cogentin 0.5 mg bid IM. -Individual counseling  Disposition: Stable for discharge back to group home.   Thedore Mins, MD 10/09/2016, 10:54 AM

## 2016-10-09 NOTE — ED Notes (Signed)
Introduced self to patient. Pt oriented to unit expectations.  Assessed pt for:  A) Anxiety &/or agitation: Pt has been calm and cooperative today. He prefers IM medication administration and he allowed medication to be given IM.  He expressed agreement with going back to his group home, or another group home if the previous group home will not take him back.   S) Safety: Safety maintained with q-15-minute checks and hourly rounds by staff.  A) ADLs: Pt able to perform ADLs independently.   P) Pick-Up (room cleanliness): Pt's room clean and free of clutter.

## 2016-10-09 NOTE — Progress Notes (Signed)
10/09/16 1403:  LRT introduced self to pt and offered activities.  Pt stated he wanted to participate.  LRT met with pt in dayroom.  LRT and pt played checkers and UN[place].  Pt was pleasant throughout activity.  Pt was repetitive in his motions and had to stop and think about things before he made a move.  Alexander Fritz, LRT/CTRS

## 2016-10-10 ENCOUNTER — Encounter (HOSPITAL_COMMUNITY): Payer: Self-pay | Admitting: *Deleted

## 2016-10-10 ENCOUNTER — Emergency Department (HOSPITAL_COMMUNITY)
Admission: EM | Admit: 2016-10-10 | Discharge: 2016-10-11 | Disposition: A | Discharge status: Home / Self Care | Payer: Medicare Other | Attending: Physician Assistant | Admitting: Physician Assistant

## 2016-10-10 DIAGNOSIS — F918 Other conduct disorders: Secondary | ICD-10-CM | POA: Insufficient documentation

## 2016-10-10 DIAGNOSIS — Z87891 Personal history of nicotine dependence: Secondary | ICD-10-CM | POA: Insufficient documentation

## 2016-10-10 DIAGNOSIS — F3131 Bipolar disorder, current episode depressed, mild: Secondary | ICD-10-CM | POA: Diagnosis not present

## 2016-10-10 DIAGNOSIS — F840 Autistic disorder: Secondary | ICD-10-CM | POA: Diagnosis not present

## 2016-10-10 DIAGNOSIS — Z79899 Other long term (current) drug therapy: Secondary | ICD-10-CM | POA: Insufficient documentation

## 2016-10-10 DIAGNOSIS — R4689 Other symptoms and signs involving appearance and behavior: Secondary | ICD-10-CM

## 2016-10-10 DIAGNOSIS — R45850 Homicidal ideations: Secondary | ICD-10-CM | POA: Diagnosis present

## 2016-10-10 DIAGNOSIS — F250 Schizoaffective disorder, bipolar type: Secondary | ICD-10-CM | POA: Diagnosis present

## 2016-10-10 LAB — SALICYLATE LEVEL: Salicylate Lvl: <7 mg/dL (ref 2.8–30.0)

## 2016-10-10 LAB — COMPREHENSIVE METABOLIC PANEL
ALBUMIN: 4.9 g/dL (ref 3.5–5.0)
ALT: 90 U/L — ABNORMAL HIGH (ref 17–63)
ANION GAP: 10 (ref 5–15)
AST: 330 U/L — AB (ref 15–41)
Alkaline Phosphatase: 89 U/L (ref 38–126)
BUN: 17 mg/dL (ref 6–20)
CO2: 26 mmol/L (ref 22–32)
Calcium: 9.6 mg/dL (ref 8.9–10.3)
Chloride: 105 mmol/L (ref 101–111)
Creatinine, Ser: 0.77 mg/dL (ref 0.61–1.24)
GFR calc Af Amer: >60 mL/min (ref >60)
GFR calc non Af Amer: >60 mL/min (ref >60)
GLUCOSE: 94 mg/dL (ref 65–99)
POTASSIUM: 4.2 mmol/L (ref 3.5–5.1)
SODIUM: 141 mmol/L (ref 135–145)
Total Bilirubin: 0.4 mg/dL (ref 0.3–1.2)
Total Protein: 8.1 g/dL (ref 6.5–8.1)

## 2016-10-10 LAB — CBC
HCT: 40.2 % (ref 39.0–52.0)
HEMOGLOBIN: 13.5 g/dL (ref 13.0–17.0)
MCH: 28.5 pg (ref 26.0–34.0)
MCHC: 33.6 g/dL (ref 30.0–36.0)
MCV: 85 fL (ref 78.0–100.0)
Platelets: 355 10*3/uL (ref 150–400)
RBC: 4.73 MIL/uL (ref 4.22–5.81)
RDW: 12.3 % (ref 11.5–15.5)
WBC: 12.5 10*3/uL — AB (ref 4.0–10.5)

## 2016-10-10 LAB — CULTURE, BLOOD (ROUTINE X 2)
Culture: NO GROWTH
Culture: NO GROWTH
Special Requests: ADEQUATE
Special Requests: ADEQUATE

## 2016-10-10 LAB — ACETAMINOPHEN LEVEL

## 2016-10-10 LAB — ETHANOL: Alcohol, Ethyl (B): <5 mg/dL (ref <5)

## 2016-10-10 LAB — RAPID URINE DRUG SCREEN, HOSP PERFORMED
AMPHETAMINES: NOT DETECTED
Barbiturates: NOT DETECTED
Benzodiazepines: NOT DETECTED
COCAINE: NOT DETECTED
Opiates: NOT DETECTED
TETRAHYDROCANNABINOL: NOT DETECTED

## 2016-10-10 MED ORDER — ACETAMINOPHEN 325 MG PO TABS: 650.0000 mg | ORAL_TABLET | ORAL | Status: DC | PRN

## 2016-10-10 MED ORDER — TRIHEXYPHENIDYL HCL 5 MG PO TABS
5.0000 mg | ORAL_TABLET | Freq: Two times a day (BID) | ORAL | Status: DC
  Filled 2016-10-10: qty 1

## 2016-10-10 MED ORDER — NICOTINE 21 MG/24HR TD PT24
21.0000 mg | MEDICATED_PATCH | Freq: Every day | TRANSDERMAL | Status: DC
  Filled 2016-10-10: qty 1

## 2016-10-10 MED ORDER — RISPERIDONE 2 MG PO TABS
2.0000 mg | ORAL_TABLET | Freq: Two times a day (BID) | ORAL | Status: DC
  Filled 2016-10-10 (×2): qty 1

## 2016-10-10 MED ORDER — IBUPROFEN 200 MG PO TABS: 600.0000 mg | ORAL_TABLET | Freq: Three times a day (TID) | ORAL | Status: DC | PRN

## 2016-10-10 MED ORDER — HALOPERIDOL DECANOATE 100 MG/ML IM SOLN
50.0000 mg | INTRAMUSCULAR | Status: DC
  Filled 2016-10-10: qty 0.5

## 2016-10-10 MED ORDER — ONDANSETRON HCL 4 MG PO TABS: 4.0000 mg | ORAL_TABLET | Freq: Three times a day (TID) | ORAL | Status: DC | PRN

## 2016-10-10 MED ORDER — HYDROXYZINE HCL 25 MG PO TABS: 25.0000 mg | ORAL_TABLET | Freq: Two times a day (BID) | ORAL | Status: DC | PRN

## 2016-10-10 MED ORDER — CARBAMAZEPINE 200 MG PO TABS
200.0000 mg | ORAL_TABLET | Freq: Two times a day (BID) | ORAL | Status: DC
  Filled 2016-10-10 (×2): qty 1

## 2016-10-10 MED ORDER — LORAZEPAM 1 MG PO TABS: 1.0000 mg | ORAL_TABLET | Freq: Three times a day (TID) | ORAL | Status: DC | PRN

## 2016-10-10 MED ORDER — ALUM & MAG HYDROXIDE-SIMETH 200-200-20 MG/5ML PO SUSP: 30.0000 mL | ORAL | Status: DC | PRN

## 2016-10-10 MED ORDER — RISPERIDONE 2 MG PO TABS
4.0000 mg | ORAL_TABLET | Freq: Every day | ORAL | Status: DC
  Filled 2016-10-10: qty 2

## 2016-10-10 NOTE — ED Notes (Signed)
Bed: WA27 Expected date:  Expected time:  Means of arrival:  Comments: [employer]-[facility]
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
## 2016-10-10 NOTE — ED Notes (Signed)
Pt refused all meds stating "I don't need any medicine.  I don't take any medicine."  Pt also offered fluids; pt refused.
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
## 2016-10-10 NOTE — ED Notes (Addendum)
Pt stated "if I go back to that group home, I'm going to kill somebody.  I can't go back to that group home.  They're against me.  They were aggressive to me so I ran away."

## 2016-10-10 NOTE — ED Notes (Signed)
Bed: [id number] Expected date:  Expected time:  Means of arrival:  Comments: [employer], involuntary F

## 2016-10-10 NOTE — ED Notes (Signed)
TTS assessment in progress. 

## 2016-10-11 NOTE — ED Notes (Signed)
Per CSW patient to be picked up by group home at 4pm

## 2016-10-11 NOTE — Progress Notes (Addendum)
CSW contacted Mr. [name] with [facility]. Patient is stable for discharge and will need to be picked up. Mr. [name] stated someone is able to pick patient up at 4PM today 5/23. CSW will update legal guardian.   11:19AM: CSW contacted Cathlean CowerVince McKnight legal guardian regarding patients pickup. Mr. Ledon SnareMcknight stated if patient does not want to return to group home he is able to DC to homeless shelter. CSW will verify with assistant director if this is a safe discharge plan.   Stacy GardnerErin Yasmene Salomone, LCSWA Clinical Social Worker [phone]

## 2016-10-11 NOTE — Progress Notes (Signed)
CSW was informed by daytime ED CSW pt was being picked up by group home.  CSW coordinated D/C with RN, security and [employer] and pt was walked from TCU to [facility] by security and [employer] and then out to his group home's vehicle.  Pt initially refused to get in vehicle, but eventually got in vehicle and left with group home driver [place].  CSW was informed by RN and TTS that pt's prescritions were not taken by the group home driver and RN and TTS called group home owner Mr. [name] at ph: [phone] and Gregary SignsSean, group home driver at ph: [phone] and left VM's.  CSW also called Gregary SignsSean pt's Gregary SignsSean, group home driver at ph: [phone] and spoke to him informing him pt's prescriptions will be at the charge nurse's desk at [facility] where the pt was picked up earlier. Please reconsult if future social work needs arise.  CSW signing off.   Dorothe PeaJonathan F. Breannah Kratt, Theresia MajorsLCSWA, LCAS Clinical Social Worker Ph: [phone]

## 2016-10-11 NOTE — ED Notes (Signed)
Patient refusing to take scheduled Artane.  He states that he does not take any medications. Per patient he stopped taking all medicines because people just keep missing them up. Writer attempted to education patient on taking medications as prescribed patient states that okay I don't need any medicines.
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
## 2016-10-11 NOTE — ED Notes (Signed)
Patient being discharged back to group home AVS signed. [facility] all belongings removed from locker 26.

## 2016-10-28 ENCOUNTER — Emergency Department (HOSPITAL_COMMUNITY)
Admission: EM | Admit: 2016-10-28 | Discharge: 2016-10-29 | Disposition: A | Discharge status: Home / Self Care | Payer: Medicare Other | Attending: Emergency Medicine | Admitting: Emergency Medicine

## 2016-10-28 ENCOUNTER — Encounter (HOSPITAL_COMMUNITY): Payer: Self-pay | Admitting: Emergency Medicine

## 2016-10-28 DIAGNOSIS — F3181 Bipolar II disorder: Secondary | ICD-10-CM | POA: Diagnosis present

## 2016-10-28 DIAGNOSIS — R488 Other symbolic dysfunctions: Secondary | ICD-10-CM | POA: Diagnosis not present

## 2016-10-28 DIAGNOSIS — F39 Unspecified mood [affective] disorder: Secondary | ICD-10-CM | POA: Diagnosis not present

## 2016-10-28 DIAGNOSIS — Z87891 Personal history of nicotine dependence: Secondary | ICD-10-CM | POA: Insufficient documentation

## 2016-10-28 DIAGNOSIS — Z79899 Other long term (current) drug therapy: Secondary | ICD-10-CM | POA: Insufficient documentation

## 2016-10-28 DIAGNOSIS — F79 Unspecified intellectual disabilities: Secondary | ICD-10-CM | POA: Diagnosis not present

## 2016-10-28 LAB — COMPREHENSIVE METABOLIC PANEL
ALK PHOS: 84 U/L (ref 38–126)
ALT: 25 U/L (ref 17–63)
ANION GAP: 7 (ref 5–15)
AST: 20 U/L (ref 15–41)
Albumin: 4.2 g/dL (ref 3.5–5.0)
BUN: 14 mg/dL (ref 6–20)
CALCIUM: 9.2 mg/dL (ref 8.9–10.3)
CO2: 26 mmol/L (ref 22–32)
Chloride: 106 mmol/L (ref 101–111)
Creatinine, Ser: 0.76 mg/dL (ref 0.61–1.24)
Glucose, Bld: 84 mg/dL (ref 65–99)
Potassium: 3.8 mmol/L (ref 3.5–5.1)
Sodium: 139 mmol/L (ref 135–145)
Total Bilirubin: 0.5 mg/dL (ref 0.3–1.2)
Total Protein: 7.2 g/dL (ref 6.5–8.1)

## 2016-10-28 LAB — CBC WITH DIFFERENTIAL/PLATELET
Basophils Absolute: 0 10*3/uL (ref 0.0–0.1)
Basophils Relative: 1 %
Eosinophils Absolute: 0.1 10*3/uL (ref 0.0–0.7)
Eosinophils Relative: 1 %
HEMATOCRIT: 38.8 % — AB (ref 39.0–52.0)
HEMOGLOBIN: 13 g/dL (ref 13.0–17.0)
LYMPHS ABS: 1.7 10*3/uL (ref 0.7–4.0)
LYMPHS PCT: 29 %
MCH: 28.1 pg (ref 26.0–34.0)
MCHC: 33.5 g/dL (ref 30.0–36.0)
MCV: 84 fL (ref 78.0–100.0)
MONOS PCT: 8 %
Monocytes Absolute: 0.4 10*3/uL (ref 0.1–1.0)
NEUTROS ABS: 3.5 10*3/uL (ref 1.7–7.7)
NEUTROS PCT: 61 %
Platelets: 309 10*3/uL (ref 150–400)
RBC: 4.62 MIL/uL (ref 4.22–5.81)
RDW: 12.1 % (ref 11.5–15.5)
WBC: 5.7 10*3/uL (ref 4.0–10.5)

## 2016-10-28 LAB — I-STAT CHEM 8, ED
BUN: 13 mg/dL (ref 6–20)
CALCIUM ION: 1.19 mmol/L (ref 1.15–1.40)
CHLORIDE: 103 mmol/L (ref 101–111)
Creatinine, Ser: 0.8 mg/dL (ref 0.61–1.24)
GLUCOSE: 79 mg/dL (ref 65–99)
HCT: 38 % — ABNORMAL LOW (ref 39.0–52.0)
Hemoglobin: 12.9 g/dL — ABNORMAL LOW (ref 13.0–17.0)
Potassium: 3.8 mmol/L (ref 3.5–5.1)
SODIUM: 141 mmol/L (ref 135–145)
TCO2: 28 mmol/L (ref 0–100)

## 2016-10-28 LAB — RAPID URINE DRUG SCREEN, HOSP PERFORMED
Amphetamines: NOT DETECTED
Barbiturates: NOT DETECTED
Benzodiazepines: NOT DETECTED
Cocaine: NOT DETECTED
Opiates: NOT DETECTED
Tetrahydrocannabinol: NOT DETECTED

## 2016-10-28 LAB — CBG MONITORING, ED: Glucose-Capillary: 92 mg/dL (ref 65–99)

## 2016-10-28 LAB — ACETAMINOPHEN LEVEL: Acetaminophen (Tylenol), Serum: <10 ug/mL — ABNORMAL LOW (ref 10–30)

## 2016-10-28 LAB — SALICYLATE LEVEL

## 2016-10-28 LAB — ETHANOL

## 2016-10-28 MED ORDER — VITAMIN B-12 1000 MCG PO TABS
1000.0000 ug | ORAL_TABLET | Freq: Every day | ORAL | Status: DC
  Filled 2016-10-28 (×2): qty 1

## 2016-10-28 MED ORDER — DOCUSATE SODIUM 100 MG PO CAPS
100.0000 mg | ORAL_CAPSULE | Freq: Every morning | ORAL | Status: DC
Start: 2016-10-29 — End: 2016-10-29

## 2016-10-28 MED ORDER — RISPERIDONE 2 MG PO TABS
4.0000 mg | ORAL_TABLET | Freq: Every day | ORAL | Status: DC
  Filled 2016-10-28: qty 2

## 2016-10-28 MED ORDER — ACETAMINOPHEN 325 MG PO TABS: 650.0000 mg | ORAL_TABLET | ORAL | Status: DC | PRN

## 2016-10-28 MED ORDER — DIVALPROEX SODIUM ER 500 MG PO TB24
500.0000 mg | ORAL_TABLET | Freq: Two times a day (BID) | ORAL | Status: DC
  Filled 2016-10-28: qty 1

## 2016-10-28 MED ORDER — ONDANSETRON HCL 4 MG PO TABS: 4.0000 mg | ORAL_TABLET | Freq: Three times a day (TID) | ORAL | Status: DC | PRN

## 2016-10-28 MED ORDER — ALUM & MAG HYDROXIDE-SIMETH 200-200-20 MG/5ML PO SUSP: 30.0000 mL | Freq: Four times a day (QID) | ORAL | Status: DC | PRN

## 2016-10-28 MED ORDER — IBUPROFEN 200 MG PO TABS: 600.0000 mg | ORAL_TABLET | Freq: Three times a day (TID) | ORAL | Status: DC | PRN

## 2016-10-28 NOTE — ED Notes (Signed)
TTS called and reported that they will be seeking inpatient placement for patient. Pt's guardian is on board with this plan

## 2016-10-28 NOTE — ED Notes (Signed)
TTS counselor at bedside. 
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
## 2016-10-28 NOTE — ED Notes (Signed)
Bed: [id number] Expected date:  Expected time:  Means of arrival:  Comments: TCU pt
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
## 2016-10-28 NOTE — Progress Notes (Signed)
Per chart, the pt's legal guardian is Cathlean CowerVince McKnight [phone] however when TTS called, a woman answered that stated that is the incorrect telephone number as she did not know anyone by that name. TTS reviewed the pt's history and located "group home owner Mr. [name] at ph: [phone]"   TTS spoke with Mr. [name] in order to obtain collateral information. Mr. [name] provided the correct phone number for the pt's legal guardian of [phone].  Princess BruinsAquicha Jevan Gaunt, MSW, LCSWA TTS Specialist [phone]

## 2016-10-28 NOTE — Progress Notes (Addendum)
TTS spoke with legal guardian Alexander Fritz [phone] to advise of treatment plan. Alexander Fritz states the group home will have a copy of the pt's IQ scores from the psych eval. TTS spoke with Mr. Alexander Fritz who states he is currently out of town and will not have access to the documentation until possibly Sunday or Monday. IQ scores and psych eval testing results needed in order to complete the referral process for possible inpt hospitals.  Alexander Fritz, MSW, LCSWA TTS Specialist [phone]
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
## 2016-10-29 DIAGNOSIS — R488 Other symbolic dysfunctions: Secondary | ICD-10-CM | POA: Diagnosis not present

## 2016-10-29 DIAGNOSIS — F79 Unspecified intellectual disabilities: Secondary | ICD-10-CM

## 2016-10-29 DIAGNOSIS — Z87891 Personal history of nicotine dependence: Secondary | ICD-10-CM | POA: Diagnosis not present

## 2016-10-29 NOTE — ED Notes (Signed)
Bed: WA27 Expected date:  Expected time:  Means of arrival:  Comments: 
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
## 2016-10-29 NOTE — ED Notes (Signed)
Pt refusing to take night time medications. Pt states that he does not take medication.

## 2016-10-29 NOTE — ED Notes (Signed)
Per Child psychotherapistsocial worker, group home was called for transport.
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
## 2016-10-29 NOTE — Progress Notes (Signed)
Per Dr. Rene KocherEksir and NP [place] patient does not meet criteria for inpatient. CSW contacted patient's legal guardian [name]([name] [phone]) and informed him of patient's disposition, patient's legal guardian in agreeance and reported that patient's group home will be picking patient up at discharge. CSW contacted patient's Group Home owner (Mr. [name] [phone]) and informed him of disposition, patient's group home owner reported that staff from group home will be picking patient up.  Celso SickleKimberly Brityn Mastrogiovanni, LCSWA [facility]  Clinical Social Worker [phone]

## 2016-10-29 NOTE — ED Notes (Signed)
Medications being sent from pharmacy due to not being stocked in pyxis. Pharmacy notified.
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
## 2016-11-04 ENCOUNTER — Emergency Department (HOSPITAL_COMMUNITY)
Admission: EM | Admit: 2016-11-04 | Discharge: 2016-11-05 | Disposition: A | Discharge status: Home / Self Care | Payer: Medicare Other | Attending: Emergency Medicine | Admitting: Emergency Medicine

## 2016-11-04 ENCOUNTER — Encounter (HOSPITAL_COMMUNITY): Payer: Self-pay | Admitting: Emergency Medicine

## 2016-11-04 DIAGNOSIS — F99 Mental disorder, not otherwise specified: Secondary | ICD-10-CM | POA: Insufficient documentation

## 2016-11-04 DIAGNOSIS — Z87891 Personal history of nicotine dependence: Secondary | ICD-10-CM | POA: Diagnosis not present

## 2016-11-04 DIAGNOSIS — Z79899 Other long term (current) drug therapy: Secondary | ICD-10-CM | POA: Diagnosis not present

## 2016-11-04 DIAGNOSIS — F840 Autistic disorder: Secondary | ICD-10-CM | POA: Diagnosis present

## 2016-11-04 DIAGNOSIS — F3131 Bipolar disorder, current episode depressed, mild: Secondary | ICD-10-CM

## 2016-11-04 DIAGNOSIS — Z046 Encounter for general psychiatric examination, requested by authority: Secondary | ICD-10-CM | POA: Insufficient documentation

## 2016-11-04 DIAGNOSIS — F3161 Bipolar disorder, current episode mixed, mild: Secondary | ICD-10-CM | POA: Insufficient documentation

## 2016-11-04 DIAGNOSIS — F250 Schizoaffective disorder, bipolar type: Secondary | ICD-10-CM | POA: Diagnosis present

## 2016-11-04 DIAGNOSIS — Z888 Allergy status to other drugs, medicaments and biological substances status: Secondary | ICD-10-CM | POA: Diagnosis not present

## 2016-11-04 LAB — RAPID URINE DRUG SCREEN, HOSP PERFORMED
Amphetamines: NOT DETECTED
BARBITURATES: NOT DETECTED
Benzodiazepines: NOT DETECTED
Cocaine: NOT DETECTED
Opiates: NOT DETECTED
TETRAHYDROCANNABINOL: NOT DETECTED

## 2016-11-04 LAB — COMPREHENSIVE METABOLIC PANEL
ALK PHOS: 109 U/L (ref 38–126)
ALT: 18 U/L (ref 17–63)
AST: 18 U/L (ref 15–41)
Albumin: 4.2 g/dL (ref 3.5–5.0)
Anion gap: 9 (ref 5–15)
BUN: 10 mg/dL (ref 6–20)
CALCIUM: 9.1 mg/dL (ref 8.9–10.3)
CO2: 26 mmol/L (ref 22–32)
CREATININE: 0.78 mg/dL (ref 0.61–1.24)
Chloride: 103 mmol/L (ref 101–111)
Glucose, Bld: 114 mg/dL — ABNORMAL HIGH (ref 65–99)
Potassium: 3.3 mmol/L — ABNORMAL LOW (ref 3.5–5.1)
SODIUM: 138 mmol/L (ref 135–145)
Total Bilirubin: 0.9 mg/dL (ref 0.3–1.2)
Total Protein: 6.8 g/dL (ref 6.5–8.1)

## 2016-11-04 LAB — CBC
HCT: 37.2 % — ABNORMAL LOW (ref 39.0–52.0)
HEMOGLOBIN: 12.5 g/dL — AB (ref 13.0–17.0)
MCH: 28.2 pg (ref 26.0–34.0)
MCHC: 33.6 g/dL (ref 30.0–36.0)
MCV: 84 fL (ref 78.0–100.0)
Platelets: 315 10*3/uL (ref 150–400)
RBC: 4.43 MIL/uL (ref 4.22–5.81)
RDW: 12 % (ref 11.5–15.5)
WBC: 8.1 10*3/uL (ref 4.0–10.5)

## 2016-11-04 LAB — ACETAMINOPHEN LEVEL: Acetaminophen (Tylenol), Serum: <10 ug/mL — ABNORMAL LOW (ref 10–30)

## 2016-11-04 LAB — ETHANOL: Alcohol, Ethyl (B): <5 mg/dL (ref <5)

## 2016-11-04 LAB — SALICYLATE LEVEL

## 2016-11-04 MED ORDER — DIVALPROEX SODIUM ER 500 MG PO TB24
500.0000 mg | ORAL_TABLET | Freq: Two times a day (BID) | ORAL | Status: DC
  Filled 2016-11-04 (×2): qty 1

## 2016-11-04 MED ORDER — VITAMIN B-12 1000 MCG PO TABS
1000.0000 ug | ORAL_TABLET | Freq: Every day | ORAL | Status: DC
  Filled 2016-11-04 (×2): qty 1

## 2016-11-04 MED ORDER — IBUPROFEN 200 MG PO TABS: 600.0000 mg | ORAL_TABLET | Freq: Three times a day (TID) | ORAL | Status: DC | PRN

## 2016-11-04 MED ORDER — RISPERIDONE 2 MG PO TABS
4.0000 mg | ORAL_TABLET | Freq: Every day | ORAL | Status: DC
  Filled 2016-11-04: qty 2

## 2016-11-04 MED ORDER — ONDANSETRON HCL 4 MG PO TABS: 4.0000 mg | ORAL_TABLET | Freq: Three times a day (TID) | ORAL | Status: DC | PRN

## 2016-11-04 MED ORDER — ACETAMINOPHEN 325 MG PO TABS: 650.0000 mg | ORAL_TABLET | ORAL | Status: DC | PRN

## 2016-11-04 NOTE — Progress Notes (Signed)
Per Barbara CowerJason berry, NP recommend a.m. Psych evaluation Kenric Ginger K. Sherlon HandingHarris, LCAS-A, LPC-A, [facility]  Counselor 11/04/2016 11:21 PM

## 2016-11-04 NOTE — ED Triage Notes (Signed)
[facility] Pt denies SI/HI or A/VH with triage.

## 2016-11-04 NOTE — ED Notes (Signed)
Bed: WA30 Expected date:  Expected time:  Means of arrival:  Comments: 
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
## 2016-11-05 DIAGNOSIS — Z87891 Personal history of nicotine dependence: Secondary | ICD-10-CM

## 2016-11-05 DIAGNOSIS — Z79899 Other long term (current) drug therapy: Secondary | ICD-10-CM | POA: Diagnosis not present

## 2016-11-05 DIAGNOSIS — Z881 Allergy status to other antibiotic agents status: Secondary | ICD-10-CM

## 2016-11-05 DIAGNOSIS — Z888 Allergy status to other drugs, medicaments and biological substances status: Secondary | ICD-10-CM

## 2016-11-05 DIAGNOSIS — F3131 Bipolar disorder, current episode depressed, mild: Secondary | ICD-10-CM

## 2016-11-05 DIAGNOSIS — F840 Autistic disorder: Secondary | ICD-10-CM

## 2016-11-05 NOTE — BH Assessment (Signed)
[facility] Assessment Progress Note  Dr. Jannifer FranklinAkintayo & Nanine MeansJamison Lord, DNP, has evaluated pt this morning and has determined that he does not meet criteria for IP treatment and can be d/c back to his group [facility], [facility]. Clinician called pt's guardian, Cathlean CowerVince Fritz [phone]) and informed him of disposition. He had no concerns. Clinician called Alexander Fritz [phone]), group home owner, and informed of disposition. Alexander Fritz [place] and his transportation person doesn't get in until @ 6pm. Alexander Fritz said that he would call and arrange pick up but it wouldn't be until @ 630pm.   Alexander ShockSamantha M. Ladona Ridgelaylor, MS, NCC, LPCA Counselor
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
## 2016-11-22 ENCOUNTER — Encounter (HOSPITAL_COMMUNITY): Payer: Self-pay | Admitting: Emergency Medicine

## 2016-11-22 ENCOUNTER — Emergency Department (HOSPITAL_COMMUNITY)
Admission: EM | Admit: 2016-11-22 | Discharge: 2016-11-23 | Disposition: A | Discharge status: Home / Self Care | Payer: Medicare Other | Attending: Emergency Medicine | Admitting: Emergency Medicine

## 2016-11-22 DIAGNOSIS — F4325 Adjustment disorder with mixed disturbance of emotions and conduct: Secondary | ICD-10-CM | POA: Diagnosis not present

## 2016-11-22 DIAGNOSIS — F250 Schizoaffective disorder, bipolar type: Secondary | ICD-10-CM | POA: Diagnosis present

## 2016-11-22 DIAGNOSIS — F840 Autistic disorder: Secondary | ICD-10-CM | POA: Diagnosis present

## 2016-11-22 DIAGNOSIS — Z79899 Other long term (current) drug therapy: Secondary | ICD-10-CM | POA: Insufficient documentation

## 2016-11-22 DIAGNOSIS — Z008 Encounter for other general examination: Secondary | ICD-10-CM | POA: Diagnosis present

## 2016-11-22 DIAGNOSIS — Z87891 Personal history of nicotine dependence: Secondary | ICD-10-CM | POA: Insufficient documentation

## 2016-11-22 DIAGNOSIS — F29 Unspecified psychosis not due to a substance or known physiological condition: Secondary | ICD-10-CM | POA: Diagnosis not present

## 2016-11-22 DIAGNOSIS — F4324 Adjustment disorder with disturbance of conduct: Secondary | ICD-10-CM | POA: Diagnosis present

## 2016-11-22 LAB — COMPREHENSIVE METABOLIC PANEL
ALBUMIN: 4.6 g/dL (ref 3.5–5.0)
ALT: 19 U/L (ref 17–63)
ANION GAP: 8 (ref 5–15)
AST: 19 U/L (ref 15–41)
Alkaline Phosphatase: 89 U/L (ref 38–126)
BUN: 6 mg/dL (ref 6–20)
CO2: 26 mmol/L (ref 22–32)
Calcium: 9.2 mg/dL (ref 8.9–10.3)
Chloride: 107 mmol/L (ref 101–111)
Creatinine, Ser: 0.81 mg/dL (ref 0.61–1.24)
GFR calc Af Amer: >60 mL/min (ref >60)
GFR calc non Af Amer: >60 mL/min (ref >60)
GLUCOSE: 98 mg/dL (ref 65–99)
POTASSIUM: 3.3 mmol/L — AB (ref 3.5–5.1)
SODIUM: 141 mmol/L (ref 135–145)
TOTAL PROTEIN: 7.4 g/dL (ref 6.5–8.1)
Total Bilirubin: 0.5 mg/dL (ref 0.3–1.2)

## 2016-11-22 LAB — CBC WITH DIFFERENTIAL/PLATELET
BASOS ABS: 0 10*3/uL (ref 0.0–0.1)
Basophils Relative: 0 %
EOS ABS: 0 10*3/uL (ref 0.0–0.7)
EOS PCT: 1 %
HCT: 42.5 % (ref 39.0–52.0)
Hemoglobin: 14.6 g/dL (ref 13.0–17.0)
LYMPHS PCT: 28 %
Lymphs Abs: 1.4 10*3/uL (ref 0.7–4.0)
MCH: 28.7 pg (ref 26.0–34.0)
MCHC: 34.4 g/dL (ref 30.0–36.0)
MCV: 83.7 fL (ref 78.0–100.0)
MONO ABS: 0.3 10*3/uL (ref 0.1–1.0)
Monocytes Relative: 6 %
Neutro Abs: 3.2 10*3/uL (ref 1.7–7.7)
Neutrophils Relative %: 65 %
PLATELETS: 293 10*3/uL (ref 150–400)
RBC: 5.08 MIL/uL (ref 4.22–5.81)
RDW: 11.9 % (ref 11.5–15.5)
WBC: 5 10*3/uL (ref 4.0–10.5)

## 2016-11-22 LAB — VALPROIC ACID LEVEL

## 2016-11-22 LAB — RAPID URINE DRUG SCREEN, HOSP PERFORMED
AMPHETAMINES: NOT DETECTED
BARBITURATES: NOT DETECTED
Benzodiazepines: NOT DETECTED
COCAINE: NOT DETECTED
Opiates: NOT DETECTED
TETRAHYDROCANNABINOL: NOT DETECTED

## 2016-11-22 LAB — ETHANOL: Alcohol, Ethyl (B): <5 mg/dL (ref <5)

## 2016-11-22 MED ORDER — CARBAMAZEPINE 200 MG PO TABS
200.0000 mg | ORAL_TABLET | Freq: Two times a day (BID) | ORAL | Status: DC
  Filled 2016-11-22 (×2): qty 1

## 2016-11-22 MED ORDER — TRIHEXYPHENIDYL HCL 5 MG PO TABS
5.0000 mg | ORAL_TABLET | Freq: Two times a day (BID) | ORAL | Status: DC
  Filled 2016-11-22: qty 1

## 2016-11-22 MED ORDER — HYDROXYZINE HCL 25 MG PO TABS: 25.0000 mg | ORAL_TABLET | Freq: Two times a day (BID) | ORAL | Status: DC | PRN

## 2016-11-22 MED ORDER — RISPERIDONE 2 MG PO TABS
2.0000 mg | ORAL_TABLET | Freq: Two times a day (BID) | ORAL | Status: DC
  Filled 2016-11-22 (×2): qty 1

## 2016-11-22 NOTE — ED Notes (Signed)
Pt offered meds again; pt refused.
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
## 2016-11-22 NOTE — ED Notes (Signed)
Patient provided with dinner tray.
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
## 2016-11-22 NOTE — BH Assessment (Signed)
Tele Assessment Note   [name] is an 26 y.o. single male, was voluntarily brought into [facility].  Patient reported not wanting to return the group.  Patient stated having HI towards unidentifiable person.  Patient reported not wanting to return the group.  Patient stated having HI towards unidentifiable person at the home.  Writer was unable to assess, SI/AVH, access to weapons, and self-harm.  Patient exhibited flight ideas when speaking.  Patient spoke about random topics such as heaven, giving others poison, democrats, and trump sounds.   Patient made statements, such "It's easier to kill a big person."  Per medical records, has previous received inpatient treatment.  Writer was unable to assess, SI/AVH or access to weapons.  Patient has received inpatient treatment at Writer was unable to assess, Patient's current providers.    Per Group Home Owner Willa Rough): Patient that was able several experiences with running away from the group home, 7 to 8 times since April.  [name] stated Patient recently went to jail for assaulting a staff member.  Patient recently was released and returned to the group home.  Patient refuses to take medication, useless he is in the hospital.   Patient was unresponsive to prompts and has spontaneous behaviors, without triggers.    During assessment, Patient was calm and cooperative.  Patient was distress in scrubs, laying in his bed.  Patient was quiet and awake.   Patient was not oriented to the person, place, situation, or time.  Patient's speech was incoherent.  Patient's eye contact was poor, resulting in him frequently looking up at the ceiling.  Patient exhibited a freedom of movement.    Diagnosis: Bipolar affective disorder, current episode mild Autism spectrum disorder  Past Medical History:  Past Medical History:  Diagnosis Date  . Autism   . Bipolar 1 disorder (HCC)     History reviewed. No pertinent surgical history.  Family History: No family history  on file.  Social History:  reports that he quit smoking about 17 months ago. His smoking use included Cigarettes. He has a 2.50 pack-year smoking history. He has quit using smokeless tobacco. He reports that he does not drink alcohol or use drugs.  Additional Social History:  Alcohol / Drug Use Pain Medications: Unable to access, due to Patient's mental status. Prescriptions: Unable to access, due to Patient's mental status. Over the Counter: Unable to access, due to Patient's mental status. History of alcohol / drug use?: No history of alcohol / drug abuse  CIWA: CIWA-Ar BP: 121/65 Pulse Rate: (!) 102 COWS:    PATIENT STRENGTHS: (choose at least two) Other: Unable to access due to Patient's mental state.  Allergies:  Allergies  Allergen Reactions  . Cefaclor Other (See Comments)    Unknown.  Per group home MAR.  Marland Kitchen Lithium Palpitations and Rash    Home Medications:  (Not in a hospital admission)  OB/GYN Status:  No LMP for male patient.  General Assessment Data Location of Assessment: [facility] TTS Assessment: In system Is this a Tele or Face-to-Face Assessment?: Face-to-Face Is this an Initial Assessment or a Re-assessment for this encounter?: Initial Assessment Marital status: Single Maiden name: N/A Is patient pregnant?: No Pregnancy Status: No Living Arrangements: Other (Comment) ([facility]) Can pt return to current living arrangement?: Yes Admission Status: Voluntary Is patient capable of signing voluntary admission?: Yes Referral Source: Other ([employer]) Insurance type: Medicare     Crisis Care Plan Living Arrangements: Other (Comment) [facility]) Legal Guardian: Other: (Per medical records, Cathlean CowerVince McKnight [phone]) ) Name of Psychiatrist: Unable to assess due to Patient's mental state Name of Therapist: Unable to assess due to Patient's mental state  Education Status Is patient currently in school?: No Current  Grade: N/A Highest grade of school patient has completed: 9th Name of school: n/a Contact person: Market researcherVince Mcknight Guardian  Risk to self with the past 6 months Suicidal Ideation:  (Unable to assess due to Patient's mental state) Has patient been a risk to self within the past 6 months prior to admission? :  (Unable to assess due to Patient's mental state) Suicidal Intent:  (Unable to assess due to Patient's mental state) Has patient had any suicidal intent within the past 6 months prior to admission? : Other (comment) Is patient at risk for suicide?:  (Unable to assess due to Patient's mental state) Suicidal Plan?:  (Unable to assess due to Patient's mental state) Has patient had any suicidal plan within the past 6 months prior to admission? : Other (comment) (Unable to assess due to Patient's mental state) Access to Means:  (Unable to assess due to Patient's mental state) What has been your use of drugs/alcohol within the last 12 months?: Per medical records, None noted Previous Attempts/Gestures: No (Per medical records) How many times?: 0 Other Self Harm Risks: Per medical records, None Triggers for Past Attempts: None known Intentional Self Injurious Behavior: None Family Suicide History: Unknown Recent stressful life event(s):  (Unable to assess due to Patient's mental state) Persecutory voices/beliefs?: No Depression:  (Unable to assess due to Patient's mental state) Depression Symptoms:  (Unable to assess due to Patient's mental state) Substance abuse history and/or treatment for substance abuse?:  (Unable to assess due to Patient's mental state) Suicide prevention information given to non-admitted patients: Not applicable  Risk to Others within the past 6 months Homicidal Ideation: Yes-Currently Present Does patient have any lifetime risk of violence toward others beyond the six months prior to admission? : Yes (comment) Thoughts of Harm to Others: Yes-Currently Present Comment  - Thoughts of Harm to Others: Pt. reports intentions to "kill" unidentified persons Current Homicidal Intent: Yes-Currently Present Current Homicidal Plan: Yes-Currently Present Describe Current Homicidal Plan: Unable to assess due to Patient's mental state Access to Homicidal Means:  (Unable to assess due to Patient's mental state) Describe Access to Homicidal Means: Unable to assess due to Patient's mental state Identified Victim: Unidentified victims. History of harm to others?: No Assessment of Violence: On admission Violent Behavior Description: Per medical records, none Does patient have access to weapons?:  (Unable to assess due to Patient's mental state) Criminal Charges Pending?: No Describe Pending Criminal Charges: Unable to assess due to Patient's mental state Does patient have a court date:  (Unable to assess due to Patient's mental state) Is patient on probation?:  (Unable to assess due to Patient's mental state)  Psychosis Hallucinations:  (Unable to assess due to Patient's mental state) Delusions:  (Unable to assess due to Patient's mental state)  Mental Status Report Appearance/Hygiene: In scrubs Eye Contact: Poor Motor Activity: Freedom of movement Speech: Incoherent, Rapid Level of Consciousness: Quiet/awake, Unable to assess Mood: Other (Comment) (Unable to assess due to Patient's mental state) Affect: Inconsistent with thought content Anxiety Level: None Thought Processes: Unable to Assess Judgement: Unable to Assess Orientation: Not oriented Obsessive Compulsive Thoughts/Behaviors: Unable to Assess  Cognitive Functioning Concentration: Poor Memory: Unable to Assess IQ:  (Unable to assess due to Patient's  mental state) Level of Function: Unable to assess due to Patient's mental state Insight: Unable to Assess Impulse Control: Unable to Assess Appetite:  (Unable to assess due to Patient's mental state) Weight Loss: 0 Weight Gain: 0 Sleep: Unable to  Assess Total Hours of Sleep:  (Unable to assess due to Patient's mental state) Vegetative Symptoms: None  ADLScreening [facility] Assessment Services) Patient's cognitive ability adequate to safely complete daily activities?:  (Unable to access, due to Patient's mental status.) Patient able to express need for assistance with ADLs?:  (Unable to access, due to Patient's mental status.) Independently performs ADLs?:  (Unable to access, due to Patient's mental status.)  Prior Inpatient Therapy Prior Inpatient Therapy: Yes Prior Therapy Dates: Unknown Prior Therapy Facilty/Provider(s): [facility] [place] [facility] (Per medical records) Reason for Treatment: psychosis  Prior Outpatient Therapy Prior Outpatient Therapy: No Prior Therapy Dates: n/a Prior Therapy Facilty/Provider(s): n/a Reason for Treatment: n/a Does patient have an ACCT team?:  (Unable to assess due to Patient's mental state) Does patient have Intensive In-House Services?  :  (Unable to assess due to Patient's mental state) Does patient have [facility] services? :  (Unable to assess due to Patient's mental state) Does patient have P4CC services?:  (Unable to assess due to Patient's mental state)  ADL Screening (condition at time of admission) Patient's cognitive ability adequate to safely complete daily activities?:  (Unable to access, due to Patient's mental status.) Is the patient deaf or have difficulty hearing?: No Does the patient have difficulty seeing, even when wearing glasses/contacts?: No Does the patient have difficulty concentrating, remembering, or making decisions?:  (Unable to access, due to Patient's mental status.) Patient able to express need for assistance with ADLs?:  (Unable to access, due to Patient's mental status.) Does the patient have difficulty dressing or bathing?:  (Unable to access, due to Patient's mental status.) Independently performs ADLs?:  (Unable to access, due to Patient's mental status.) Does the patient  have difficulty walking or climbing stairs?:  (Unable to access, due to Patient's mental status.) Weakness of Legs:  (Unable to access, due to Patient's mental status.) Weakness of Arms/Hands:  (Unable to access, due to Patient's mental status.)  Home Assistive Devices/Equipment Home Assistive Devices/Equipment:  (Unable to access, due to Patient's mental status.)    Abuse/Neglect Assessment (Assessment to be complete while patient is alone) Physical Abuse:  (Unable to access, due to Patient's mental status.) Verbal Abuse:  (Unable to access, due to Patient's mental status.) Sexual Abuse:  (Unable to access, due to Patient's mental status.) Exploitation of patient/patient's resources:  (Unable to access, due to Patient's mental status.) Self-Neglect:  (Unable to access, due to Patient's mental status.)     Advance Directives (For Healthcare) Does Patient Have a Medical Advance Directive?: No Would patient like information on creating a medical advance directive?: No - Patient declined Nutrition Screen- [facility] Adult/[facility] Patient's home diet: Regular (Provided patient with sandwhich and ice water, per request.)  Additional Information 1:1 In Past 12 Months?: Yes (Per medical records) CIRT Risk: Yes (Per medical records) Elopement Risk: Yes Does patient have medical clearance?: Yes     Disposition:  Disposition Initial Assessment Completed for this Encounter: Yes Disposition of Patient: Other dispositions (Per Dr. Jannifer Franklin and Nanine Means, DNP) Type of inpatient treatment program: Adult (AM Discharge) Other disposition(s): Other (Comment) (AM Discharge)  Talbert Nan 11/22/2016 7:08 PM

## 2016-11-22 NOTE — ED Notes (Signed)
Bed: WA03 Expected date:  Expected time:  Means of arrival:  Comments: 26 yo psych eval

## 2016-11-22 NOTE — ED Provider Notes (Signed)
[facility] DEPT Provider Note   CSN: [id number] Arrival date & time: 11/22/16  1500     History   Chief Complaint Chief Complaint  Patient presents with  . Medical Clearance    HPI Alexander Fritz is a 26 y.o. male.  HPI Patient presents to the emergency room for psychiatric evaluation. Patient attempted to run away from his group home. He was brought in by [employer]. Patient states that his group home is not a good situation. He does not need to live there.  Patient denies any suicidal or homicidal ideation. Nursing staff noted that he has been laughing inappropriately at times. Patient denies any hallucinations. Past Medical History:  Diagnosis Date  . Autism   . Bipolar 1 disorder [facility](HCC)     Patient Active Problem List   Diagnosis Date Noted  . Bipolar affective disorder, current episode mild (HCC) 10/05/2016  . Autism spectrum disorder 08/26/2016    History reviewed. No pertinent surgical history.     Home Medications    Prior to Admission medications   Medication Sig Start Date End Date Taking? Authorizing Provider  carbamazepine (TEGRETOL) 200 MG tablet Take 1 tablet (200 mg total) by mouth 2 (two) times daily. (0800 & 2000) Patient not taking: Reported on 10/04/2016 09/18/16   Charm RingsLord, Jamison Y, NP  cetirizine (ZYRTEC) 10 MG tablet Take 10 mg by mouth daily. (0800)    [provider]  divalproex (DEPAKOTE ER) 500 MG 24 hr tablet Take 500 mg by mouth 2 (two) times daily. (0800 & 2000)    [provider]  docusate sodium (COLACE) 100 MG capsule Take 100 mg by mouth every morning.     [provider]  haloperidol decanoate (HALDOL DECANOATE) 100 MG/ML injection Inject 0.5 mLs (50 mg total) into the muscle every 30 (thirty) days. Patient not taking: Reported on 10/10/2016 10/09/16   Charm RingsLord, Jamison Y, NP  hydrOXYzine (ATARAX/VISTARIL) 25 MG tablet Take 1 tablet (25 mg total) by mouth 2 (two) times daily as needed for anxiety (agitation). Patient not  taking: Reported on 10/10/2016 10/09/16   Charm RingsLord, Jamison Y, NP  risperiDONE (RISPERDAL) 2 MG tablet Take 1 tablet (2 mg total) by mouth 2 (two) times daily. Patient not taking: Reported on 10/04/2016 09/18/16   Charm RingsLord, Jamison Y, NP  risperidone (RISPERDAL) 4 MG tablet Take 4 mg by mouth at bedtime. (2000)    [provider]  trihexyphenidyl (ARTANE) 5 MG tablet Take 1 tablet (5 mg total) by mouth 2 (two) times daily with breakfast and lunch. (0800 & 2000) Patient not taking: Reported on 10/04/2016 09/18/16   Charm RingsLord, Jamison Y, NP  vitamin B-12 (CYANOCOBALAMIN) 1000 MCG tablet Take 1,000 mcg by mouth at bedtime. (20:00)    [provider]    Family History No family history on file.  Social History Social History  Substance Use Topics  . Smoking status: Former Smoker    Packs/day: 0.50    Years: 5.00    Types: Cigarettes    Quit date: 06/23/2015  . Smokeless tobacco: Former NeurosurgeonUser  . Alcohol use No     Allergies   Cefaclor and Lithium   Review of Systems Review of Systems  All other systems reviewed and are negative.    Physical Exam Updated Vital Signs BP 121/65   Pulse (!) 102   Temp 99.4 F (37.4 C) (Oral)   Resp 16   Ht 1.727 m (5\' 8" )   Wt 67.1 kg (148 lb)   SpO2 98%  BMI 22.50 kg/m   Physical Exam  Constitutional: No distress.  Poor hygiene   HENT:  Head: Normocephalic and atraumatic.  Right Ear: External ear normal.  Left Ear: External ear normal.  Eyes: Conjunctivae are normal. Right eye exhibits no discharge. Left eye exhibits no discharge. No scleral icterus.  Neck: Neck supple. No tracheal deviation present.  Cardiovascular: Normal rate, regular rhythm and intact distal pulses.   Pulmonary/Chest: Effort normal and breath sounds normal. No stridor. No respiratory distress. He has no wheezes. He has no rales.  Abdominal: Soft. Bowel sounds are normal. He exhibits no distension. There is no tenderness. There is no rebound and no guarding.    Musculoskeletal: He exhibits no edema or tenderness.  Neurological: He is alert. He has normal strength. No cranial nerve deficit (no facial droop, extraocular movements intact, no slurred speech) or sensory deficit. He exhibits normal muscle tone. He displays no seizure activity. Coordination normal.  Skin: Skin is warm and dry. No rash noted.  Psychiatric: His affect is inappropriate. His speech is rapid and/or pressured. He is not aggressive and not withdrawn. He does not exhibit a depressed mood. He expresses no homicidal and no suicidal ideation.  Nursing note and vitals reviewed.    ED Treatments / Results  Labs (all labs ordered are listed, but only abnormal results are displayed) Labs Reviewed  COMPREHENSIVE METABOLIC PANEL - Abnormal; Notable for the following:       Result Value   Potassium 3.3 (*)    All other components within normal limits  ETHANOL  RAPID URINE DRUG SCREEN, HOSP PERFORMED  CBC WITH DIFFERENTIAL/PLATELET  VALPROIC ACID LEVEL      Procedures Procedures (including critical care time)  Medications Ordered in ED Medications  carbamazepine (TEGRETOL) tablet 200 mg (not administered)  risperiDONE (RISPERDAL) tablet 2 mg (not administered)  trihexyphenidyl (ARTANE) tablet 5 mg (not administered)  hydrOXYzine (ATARAX/VISTARIL) tablet 25 mg (not administered)     Initial Impression / Assessment and Plan / ED Course  I have reviewed the triage vital signs and the nursing notes.  Pertinent labs & imaging results that were available during my care of the patient were reviewed by me and considered in my medical decision making (see chart for details).   patient presents for evaluation from his group home. Patient attempted to run away. Denies any suicidal ideation right now.  Patient is medically cleared. We'll consult with TTS to see if he needs psychiatric admission or if he is appropriate to return to his group home.  Final Clinical Impressions(s) / ED  Diagnoses   Final diagnoses:  Medical clearance for psychiatric admission    New Prescriptions New Prescriptions   No medications on file     Linwood Dibbles, MD 11/22/16 1718

## 2016-11-22 NOTE — ED Notes (Addendum)
Pt refused meds.  Pt stated "I don't take medicine.  I haven't taken medicines in months.  Trickery is a sin.  It's a disease.  I'm going to kill somebody."

## 2016-11-23 DIAGNOSIS — F840 Autistic disorder: Secondary | ICD-10-CM | POA: Diagnosis not present

## 2016-11-23 DIAGNOSIS — F4324 Adjustment disorder with disturbance of conduct: Secondary | ICD-10-CM | POA: Diagnosis present

## 2016-11-23 DIAGNOSIS — F4325 Adjustment disorder with mixed disturbance of emotions and conduct: Secondary | ICD-10-CM | POA: Diagnosis not present

## 2016-11-23 DIAGNOSIS — Z008 Encounter for other general examination: Secondary | ICD-10-CM | POA: Diagnosis not present

## 2016-11-23 DIAGNOSIS — F419 Anxiety disorder, unspecified: Secondary | ICD-10-CM | POA: Diagnosis not present

## 2016-11-23 DIAGNOSIS — F29 Unspecified psychosis not due to a substance or known physiological condition: Secondary | ICD-10-CM | POA: Diagnosis not present

## 2016-11-23 DIAGNOSIS — Z87891 Personal history of nicotine dependence: Secondary | ICD-10-CM | POA: Diagnosis not present

## 2016-11-23 MED ORDER — HALOPERIDOL DECANOATE 100 MG/ML IM SOLN
50.0000 mg | INTRAMUSCULAR | Status: DC
Start: 2016-11-23 — End: 2016-11-23
  Administered 2016-11-23: 50 mg via INTRAMUSCULAR
  Filled 2016-11-23: qty 0.5

## 2016-11-23 MED ORDER — HALOPERIDOL LACTATE 5 MG/ML IJ SOLN
5.0000 mg | Freq: Once | INTRAMUSCULAR | Status: DC
  Filled 2016-11-23: qty 1

## 2016-11-23 MED ORDER — DIVALPROEX SODIUM 500 MG PO DR TAB: 500.0000 mg | DELAYED_RELEASE_TABLET | Freq: Two times a day (BID) | ORAL | 0 refills | Status: DC

## 2016-11-23 MED ORDER — TRIHEXYPHENIDYL HCL 5 MG PO TABS
5.0000 mg | ORAL_TABLET | ORAL | Status: DC
  Administered 2016-11-23: 5 mg via ORAL
  Filled 2016-11-23: qty 1

## 2016-11-23 MED ORDER — TRIHEXYPHENIDYL HCL 5 MG PO TABS: 5.0000 mg | ORAL_TABLET | Freq: Two times a day (BID) | ORAL | 0 refills | Status: DC

## 2016-11-23 MED ORDER — POTASSIUM CHLORIDE CRYS ER 20 MEQ PO TBCR
40.0000 meq | EXTENDED_RELEASE_TABLET | Freq: Once | ORAL | Status: DC
  Filled 2016-11-23 (×2): qty 2

## 2016-11-23 MED ORDER — DIVALPROEX SODIUM 500 MG PO DR TAB
500.0000 mg | DELAYED_RELEASE_TABLET | Freq: Two times a day (BID) | ORAL | Status: DC
  Administered 2016-11-23: 500 mg via ORAL
  Filled 2016-11-23: qty 1

## 2016-11-23 MED ORDER — HALOPERIDOL DECANOATE 100 MG/ML IM SOLN: 50.0000 mg | INTRAMUSCULAR | 0 refills | Status: DC

## 2016-11-23 MED ORDER — AMMONIA AROMATIC IN INHA
RESPIRATORY_TRACT | Status: AC
  Filled 2016-11-23: qty 10

## 2016-11-23 MED ORDER — DIPHENHYDRAMINE HCL 50 MG/ML IJ SOLN
25.0000 mg | Freq: Once | INTRAMUSCULAR | Status: DC
  Filled 2016-11-23: qty 1

## 2016-11-23 MED ORDER — HALOPERIDOL 1 MG PO TABS
1.0000 mg | ORAL_TABLET | Freq: Two times a day (BID) | ORAL | Status: DC
  Administered 2016-11-23: 1 mg via ORAL
  Filled 2016-11-23 (×3): qty 1

## 2016-11-23 MED ORDER — HYDROXYZINE HCL 25 MG PO TABS
25.0000 mg | ORAL_TABLET | Freq: Two times a day (BID) | ORAL | 0 refills | Status: DC | PRN
Start: 2016-11-23 — End: 2023-05-17

## 2016-11-23 MED ORDER — HALOPERIDOL 1 MG PO TABS: 1.0000 mg | ORAL_TABLET | Freq: Two times a day (BID) | ORAL | 0 refills | Status: DC

## 2016-11-23 NOTE — Progress Notes (Signed)
Alex from group home has arrived to pick pt up. [name] was made aware by CSW that pt had not been told that pt would be returning to the group home just yet. However upon pt seeing Alex, pt said "I will go with him Trinna Post(Alex)". No more concerns are present at this time.     Claude MangesKierra S. Abbygael Curtiss, MSW, LCSW-A Emergency Department Clinical Social Worker [phone]

## 2016-11-23 NOTE — ED Provider Notes (Signed)
Pt sleeping. Labs yesterday K+ 3.3. Depakote level 0.--However, Dep[facility] part of [facility] tx [facility]. Will give Kdur. Await placement.   Rolland PorterJames, Lovell, MD 11/23/16 [phone]

## 2016-11-23 NOTE — Progress Notes (Signed)
Pt discharged with Alexander DungAlexander Fritz, [facility].  Pt [facility] and is compliant with this.  Denies pain.  Received medication scripts in place with [name].  Discussed discharge instructions.  Verbalized understanding.  No further concerns at this time.  Pt has all belongings.

## 2016-11-23 NOTE — [facility] Suicide Risk Assessment (Signed)
Suicide Risk Assessment  Discharge Assessment   [facility]   Principal Problem: Adjustment disorder with mixed disturbance of emotions and conduct Discharge Diagnoses:  Patient Active Problem List   Diagnosis Date Noted  . Adjustment disorder with mixed disturbance of emotions and conduct [F43.25] 11/23/2016    Priority: High  . Bipolar affective disorder, current episode mild (HCC) [F31.9] 10/05/2016    Priority: High  . Autism spectrum disorder [F84.0] 08/26/2016    Priority: High    Total Time spent with patient: 45 minutes   Musculoskeletal: Strength & Muscle Tone: within normal limits Gait & Station: normal Patient leans: N/A  Psychiatric Specialty Exam: Physical Exam  Constitutional: He is oriented to person, place, and time. He appears well-developed and well-nourished.  HENT:  Head: Normocephalic.  Neck: Normal range of motion.  Respiratory: Effort normal.  Musculoskeletal: Normal range of motion.  Neurological: He is alert and oriented to person, place, and time.  Psychiatric: He has a normal mood and affect. His speech is normal and behavior is normal. Thought content normal. Cognition and memory are normal. He expresses impulsivity.    Review of Systems  All other systems reviewed and are negative.   Blood pressure 120/62, pulse 60, temperature 98.4 F (36.9 C), temperature source Oral, resp. rate 16, height 5\' 8"  (1.727 m), weight 67.1 kg (148 lb), SpO2 100 %.Body mass index is 22.5 kg/m.  General Appearance: Casual  Eye Contact:  Good  Speech:  Normal Rate  Volume:  Normal  Mood:  Euthymic  Affect:  Congruent  Thought Process:  Coherent and Descriptions of Associations: Intact  Orientation:  Full (Time, Place, and Person)  Thought Content:  WDL and Logical  Suicidal Thoughts:  No  Homicidal Thoughts:  No  Memory:  Immediate;   Good Recent;   Good Remote;   Good  Judgement:  Fair  Insight:  Fair  Psychomotor Activity:   Normal  Concentration:  Concentration: Good and Attention Span: Good  Recall:  Good  Fund of Knowledge:  Fair  Language:  Good  Akathisia:  No  Handed:  Right  AIMS (if indicated):     Assets:  Housing Leisure Time Physical Health Resilience Social Support  ADL's:  Intact  Cognition:  Impaired,  Mild  Sleep:        Mental Status Per Nursing Assessment::   On Admission:   homicidal ideations, vague  Demographic Factors:  Male, Adolescent or young adult and Caucasian  Loss Factors: NA  Historical Factors: Impulsivity  Risk Reduction Factors:   Sense of responsibility to family, Living with another person, especially a relative, Positive social support and Positive therapeutic relationship  Continued Clinical Symptoms:  None  Cognitive Features That Contribute To Risk:  None    Suicide Risk:  Minimal: No identifiable suicidal ideation.  Patients presenting with no risk factors but with morbid ruminations; may be classified as minimal risk based on the severity of the depressive symptoms    Plan Of Care/Follow-up recommendations:  Activity:  as tolerated Diet:  heart healthy diet  Banjamin Stovall, NP 11/23/2016, 10:55 AM

## 2016-11-23 NOTE — Consult Note (Signed)
[facility] Face-to-Face Psychiatry Consult   Reason for Consult:  Homicidal ideations, vague Referring Physician:  EDP Patient Identification: Alexander Fritz MRN:  [id number] Principal Diagnosis: Adjustment disorder with mixed disturbance of emotions and conduct Diagnosis:   Patient Active Problem List   Diagnosis Date Noted  . Adjustment disorder with mixed disturbance of emotions and conduct [F43.25] 11/23/2016    Priority: High  . Bipolar affective disorder, current episode mild ([place]) [F31.9] 10/05/2016    Priority: High  . Autism spectrum disorder [F84.0] 08/26/2016    Priority: High    Total Time spent with patient: 45 minutes  Subjective:   Alexander Fritz is a 26 y.o. male patient is psychiatrically cleared, social work placement at this time.  HPI:  26 yo male who presented to the ED with vague homicidal ideations towards no one in general.  His big issue is not liking his group home.  When asked if he ever liked any of his group homes and he said, "No."  No suicidal/homicidal ideations, hallucinations, or substance abuse.  Calm and cooperative, requests to restart his Haldol injection as he felt it did help him.  Stable from a psychiatric perspective, social work placement at this time.  Past Psychiatric History: bipolar affective disorder  Risk to Self: None Risk to Others: None Prior Inpatient Therapy: Prior Inpatient Therapy: Yes Prior Therapy Dates: Unknown Prior Therapy Facilty/Provider(s): [place], [place] fear (Per medical records) Reason for Treatment: psychosis Prior Outpatient Therapy: Prior Outpatient Therapy: No Prior Therapy Dates: n/a Prior Therapy Facilty/Provider(s): n/a Reason for Treatment: n/a Does patient have an ACCT team?:  (Unable to assess due to Patient's mental state) Does patient have Intensive In-House Services?  :  (Unable to assess due to Patient's mental state) Does patient have [facility] services? :  (Unable to assess due to Patient's mental state) Does patient  have P4CC services?:  (Unable to assess due to Patient's mental state)  Past Medical History:  Past Medical History:  Diagnosis Date  . Autism   . Bipolar 1 disorder ([place])    History reviewed. No pertinent surgical history. Family History: No family history on file. Family Psychiatric  History: unknown Social History:  History  Alcohol Use No     History  Drug Use No    Social History   Social History  . Marital status: Unknown    Spouse name: N/A  . Number of children: N/A  . Years of education: N/A   Social History Main Topics  . Smoking status: Former Smoker    Packs/day: 0.50    Years: 5.00    Types: Cigarettes    Quit date: 06/23/2015  . Smokeless tobacco: Former Systems developer  . Alcohol use No  . Drug use: No  . Sexual activity: No   Other Topics Concern  . None   Social History Narrative  . None   Additional Social History:    Allergies:   Allergies  Allergen Reactions  . Cefaclor Other (See Comments)    Unknown.  Per group home MAR.  Marland Kitchen Lithium Palpitations and Rash    Labs:  Results for orders placed or performed during the hospital encounter of 11/22/16 (from the past 48 hour(s))  Urine rapid drug screen (hosp performed)     Status: None   Collection Time: 11/22/16  3:36 PM  Result Value Ref Range   Opiates NONE DETECTED NONE DETECTED   Cocaine NONE DETECTED NONE DETECTED   Benzodiazepines NONE DETECTED NONE DETECTED   Amphetamines NONE DETECTED NONE  DETECTED   Tetrahydrocannabinol NONE DETECTED NONE DETECTED   Barbiturates NONE DETECTED NONE DETECTED    Comment:        DRUG SCREEN FOR MEDICAL PURPOSES ONLY.  IF CONFIRMATION IS NEEDED FOR ANY PURPOSE, NOTIFY LAB WITHIN 5 DAYS.        LOWEST DETECTABLE LIMITS FOR URINE DRUG SCREEN Drug Class       Cutoff (ng/mL) Amphetamine      1000 Barbiturate      200 Benzodiazepine   845 Tricyclics       364 Opiates          300 Cocaine          300 THC              50   Comprehensive metabolic panel      Status: Abnormal   Collection Time: 11/22/16  3:43 PM  Result Value Ref Range   Sodium 141 135 - 145 mmol/L   Potassium 3.3 (L) 3.5 - 5.1 mmol/L   Chloride 107 101 - 111 mmol/L   CO2 26 22 - 32 mmol/L   Glucose, Bld 98 65 - 99 mg/dL   BUN 6 6 - 20 mg/dL   Creatinine, Ser 0.81 0.61 - 1.24 mg/dL   Calcium 9.2 8.9 - 10.3 mg/dL   Total Protein 7.4 6.5 - 8.1 g/dL   Albumin 4.6 3.5 - 5.0 g/dL   AST 19 15 - 41 U/L   ALT 19 17 - 63 U/L   Alkaline Phosphatase 89 38 - 126 U/L   Total Bilirubin 0.5 0.3 - 1.2 mg/dL   GFR calc non Af Amer >60 >60 mL/min   GFR calc Af Amer >60 >60 mL/min    Comment: (NOTE) The eGFR has been calculated using the CKD EPI equation. This calculation has not been validated in all clinical situations. eGFR's persistently <60 mL/min signify possible Chronic Kidney Disease.    Anion gap 8 5 - 15  Ethanol     Status: None   Collection Time: 11/22/16  3:43 PM  Result Value Ref Range   Alcohol, Ethyl (B) <5 <5 mg/dL    Comment:        LOWEST DETECTABLE LIMIT FOR SERUM ALCOHOL IS 5 mg/dL FOR MEDICAL PURPOSES ONLY   CBC with Diff     Status: None   Collection Time: 11/22/16  3:43 PM  Result Value Ref Range   WBC 5.0 4.0 - 10.5 K/uL   RBC 5.08 4.22 - 5.81 MIL/uL   Hemoglobin 14.6 13.0 - 17.0 g/dL   HCT 42.5 39.0 - 52.0 %   MCV 83.7 78.0 - 100.0 fL   MCH 28.7 26.0 - 34.0 pg   MCHC 34.4 30.0 - 36.0 g/dL   RDW 11.9 11.5 - 15.5 %   Platelets 293 150 - 400 K/uL   Neutrophils Relative % 65 %   Neutro Abs 3.2 1.7 - 7.7 K/uL   Lymphocytes Relative 28 %   Lymphs Abs 1.4 0.7 - 4.0 K/uL   Monocytes Relative 6 %   Monocytes Absolute 0.3 0.1 - 1.0 K/uL   Eosinophils Relative 1 %   Eosinophils Absolute 0.0 0.0 - 0.7 K/uL   Basophils Relative 0 %   Basophils Absolute 0.0 0.0 - 0.1 K/uL  Valproic acid level     Status: Abnormal   Collection Time: 11/22/16  3:43 PM  Result Value Ref Range   Valproic Acid Lvl <10 (L) 50.0 - 100.0 ug/mL    Comment:  RESULTS CONFIRMED  BY MANUAL DILUTION    Current Facility-Administered Medications  Medication Dose Route Frequency Provider Last Rate Last Dose  . ammonia inhalant           . divalproex (DEPAKOTE) DR tablet 500 mg  500 mg Oral BID Hero Kulish, MD      . haloperidol (HALDOL) tablet 1 mg  1 mg Oral BID Rosalita Carey, MD   1 mg at 11/23/16 1020  . haloperidol decanoate (HALDOL DECANOATE) 100 MG/ML injection 50 mg  50 mg Intramuscular Q30 days Yvette Roark, MD      . haloperidol lactate (HALDOL) injection 5 mg  5 mg Intramuscular Once Palumbo, April, MD      . hydrOXYzine (ATARAX/VISTARIL) tablet 25 mg  25 mg Oral BID PRN Dorie Rank, MD      . trihexyphenidyl (ARTANE) tablet 5 mg  5 mg Oral BID WC Dorie Rank, MD   5 mg at 11/23/16 1006   Current Outpatient Prescriptions  Medication Sig Dispense Refill  . carbamazepine (TEGRETOL) 200 MG tablet Take 1 tablet (200 mg total) by mouth 2 (two) times daily. (0800 & 2000) (Patient not taking: Reported on 10/04/2016) 60 tablet 0  . cetirizine (ZYRTEC) 10 MG tablet Take 10 mg by mouth daily. (0800)    . divalproex (DEPAKOTE ER) 500 MG 24 hr tablet Take 500 mg by mouth 2 (two) times daily. (0800 & 2000)    . docusate sodium (COLACE) 100 MG capsule Take 100 mg by mouth every morning.     . haloperidol decanoate (HALDOL DECANOATE) 100 MG/ML injection Inject 0.5 mLs (50 mg total) into the muscle every 30 (thirty) days. (Patient not taking: Reported on 10/10/2016) 0.5 mL 0  . hydrOXYzine (ATARAX/VISTARIL) 25 MG tablet Take 1 tablet (25 mg total) by mouth 2 (two) times daily as needed for anxiety (agitation). (Patient not taking: Reported on 10/10/2016) 30 tablet 0  . risperiDONE (RISPERDAL) 2 MG tablet Take 1 tablet (2 mg total) by mouth 2 (two) times daily. (Patient not taking: Reported on 10/04/2016) 60 tablet 0  . risperidone (RISPERDAL) 4 MG tablet Take 4 mg by mouth at bedtime. (2000)    . trihexyphenidyl (ARTANE) 5 MG tablet Take 1 tablet (5 mg total) by mouth 2  (two) times daily with breakfast and lunch. (0800 & 2000) (Patient not taking: Reported on 10/04/2016) 60 tablet 0  . vitamin B-12 (CYANOCOBALAMIN) 1000 MCG tablet Take 1,000 mcg by mouth at bedtime. (20:00)      Musculoskeletal: Strength & Muscle Tone: within normal limits Gait & Station: normal Patient leans: N/A  Psychiatric Specialty Exam: Physical Exam  Constitutional: He is oriented to person, place, and time. He appears well-developed and well-nourished.  HENT:  Head: Normocephalic.  Neck: Normal range of motion.  Respiratory: Effort normal.  Musculoskeletal: Normal range of motion.  Neurological: He is alert and oriented to person, place, and time.  Psychiatric: He has a normal mood and affect. His speech is normal and behavior is normal. Thought content normal. Cognition and memory are normal. He expresses impulsivity.    Review of Systems  All other systems reviewed and are negative.   Blood pressure 120/62, pulse 60, temperature 98.4 F (36.9 C), temperature source Oral, resp. rate 16, height _0  (1.727 m), weight 67.1 kg (148 lb), SpO2 100 %.Body mass index is 22.5 kg/m.  General Appearance: Casual  Eye Contact:  Good  Speech:  Normal Rate  Volume:  Normal  Mood:  Euthymic  Affect:  Congruent  Thought Process:  Coherent and Descriptions of Associations: Intact  Orientation:  Full (Time, Place, and Person)  Thought Content:  WDL and Logical  Suicidal Thoughts:  No  Homicidal Thoughts:  No  Memory:  Immediate;   Good Recent;   Good Remote;   Good  Judgement:  Fair  Insight:  Fair  Psychomotor Activity:  Normal  Concentration:  Concentration: Good and Attention Span: Good  Recall:  Good  Fund of Knowledge:  Fair  Language:  Good  Akathisia:  No  Handed:  Right  AIMS (if indicated):     Assets:  Housing Leisure Time Physical Health Resilience Social Support  ADL's:  Intact  Cognition:  Impaired,  Mild  Sleep:        Treatment Plan  Summary: Diagnosis: 1. Autism disorder 2. Adjustment disorder with mixed disturbance of emotions and conduct:  Plan: -Patient is cleared by psychiatric service, needs placement by the social worker -Medication management:   Haldol 1 mg BID for psychosis, Haldol 50 mg deconate IM once, Vistaril 25 mg BID anxiety PRN, Depakote 500 mg BID for mood stabilization, and Artane 5 mg BID for EPS -Individual counseling  Disposition: No evidence of imminent risk to self or others at present.    Waylan Boga, NP 11/23/2016 10:42 AM  Patient seen face-to-face for psychiatric evaluation, chart reviewed and case discussed with the physician extender and developed treatment plan. Reviewed the information documented and agree with the treatment plan. Corena Pilgrim, MD

## 2016-11-23 NOTE — Progress Notes (Addendum)
CSW contacted Alexander Fritz ( Alexander Fritz's legal guardian) to inform him discharge plans for Alexander Fritz. Alexander Fritz was agreeable to Alexander Fritz being discharged back to [facility] due to no other housing options being available to Alexander Fritz at this time. Vince informed CSW that Alexander Fritz also has a [employer] Start Worker named Alexander Folks([name]). CSW was asked to contact Alexander Fritz to inform  her of Alexander Fritz's latest admission and to update her on what is taking place with Alexander Fritz. CSW contacted Alexander Fritz at [phone]. Alexander Fritz provided a [employer] Start Crisis Hotline number that should be called in the event of Alexander Fritz being admitted to the hospital. The number for the Crisis hotline is [phone]. Alexander Fritz spoke with Alexander Fritz via unit phone where he informed her that he did not want to return to the group home.     Alexander Fritz, MSW, LCSW-A Emergency Department Clinical Social Worker [phone]

## 2016-11-23 NOTE — Discharge Instructions (Signed)
Follow-up with your usual primary care physician and psychiatrist. Continue medications at current dosage: Artane 5mg  by mouth am and pm Vistaril 25mg  by mouth am and pm as needed for anxiety Depakote 500mg  po am and pm Haldol 1mg  by mouth am and pm

## 2016-11-23 NOTE — ED Notes (Signed)
Pt offered mac n' cheese.  Pt 1st accepted, then refused.

## 2016-11-23 NOTE — ED Notes (Signed)
Pt now lying back in bed & is no longer threatening staff.

## 2016-11-23 NOTE — Progress Notes (Addendum)
CSW spoke with Mr. Alexander Fritz Programmer, applications(owener) at [facility]. Mr. Alexander Fritz confirmed that pt can return to the group home. Mr. Alexander Fritz posed questions regarding pt and pt's medication compliance. Mr. Alexander Fritz informed CSW that he would send someone to pick pt up around 1 if pt is ready to be discharged. CSW will have a conversation with both pt and [name] (group home worker) once [name] arrives. No further questions or concerns at this time.    Claude MangesKierra S. Siriah Treat, MSW, LCSW-A Emergency Department Clinical Social Worker [phone]

## 2016-11-23 NOTE — Progress Notes (Signed)
Patient from [facility] will follow up with discharge plans.   Stacy GardnerErin Telford Archambeau, LCSWA Clinical Social Worker [phone]

## 2016-11-23 NOTE — ED Notes (Signed)
Attempted to administer all oral and IM medications.  Pt refused at this time.  Informed Dr. Jannifer FranklinAkintayo.  Will continue to encourage the patient to take medications.  Pt resting comfortably in bed.  Barrie LymeVance, Ilean Spradlin E RN 10:31 AM 11/23/2016

## 2016-11-23 NOTE — ED Notes (Signed)
Informed Dr. Jannifer FranklinAkintayo of Pt's desire to take depakote and lithium at this time.  Barrie LymeVance, Thong Feeny E RN 10:56 AM 11/23/2016

## 2016-11-23 NOTE — ED Notes (Signed)
Pt cursing stating "I can just touch somebody and kill them.  What are you going to do bitch.  All you white bitches.  You're still doing [employer].  I want to go to jail.  Hit me bitch.  Assault me bitch."  Dr. Nicanor AlconPalumbo informed of pt behavior.

## 2016-11-24 ENCOUNTER — Emergency Department (HOSPITAL_COMMUNITY)
Admission: EM | Admit: 2016-11-24 | Discharge: 2016-11-25 | Payer: Medicare Other | Attending: Emergency Medicine | Admitting: Emergency Medicine

## 2016-11-24 ENCOUNTER — Encounter (HOSPITAL_COMMUNITY): Payer: Self-pay

## 2016-11-24 DIAGNOSIS — Z79899 Other long term (current) drug therapy: Secondary | ICD-10-CM | POA: Diagnosis not present

## 2016-11-24 DIAGNOSIS — R45850 Homicidal ideations: Secondary | ICD-10-CM | POA: Insufficient documentation

## 2016-11-24 DIAGNOSIS — R4689 Other symptoms and signs involving appearance and behavior: Secondary | ICD-10-CM

## 2016-11-24 DIAGNOSIS — R4589 Other symptoms and signs involving emotional state: Secondary | ICD-10-CM | POA: Insufficient documentation

## 2016-11-24 DIAGNOSIS — Z87891 Personal history of nicotine dependence: Secondary | ICD-10-CM | POA: Diagnosis not present

## 2016-11-24 DIAGNOSIS — F840 Autistic disorder: Secondary | ICD-10-CM | POA: Insufficient documentation

## 2016-11-24 LAB — CBC
HEMATOCRIT: 43.3 % (ref 39.0–52.0)
Hemoglobin: 15 g/dL (ref 13.0–17.0)
MCH: 29 pg (ref 26.0–34.0)
MCHC: 34.6 g/dL (ref 30.0–36.0)
MCV: 83.6 fL (ref 78.0–100.0)
PLATELETS: 295 10*3/uL (ref 150–400)
RBC: 5.18 MIL/uL (ref 4.22–5.81)
RDW: 11.8 % (ref 11.5–15.5)
WBC: 7.3 10*3/uL (ref 4.0–10.5)

## 2016-11-24 LAB — RAPID URINE DRUG SCREEN, HOSP PERFORMED
AMPHETAMINES: NOT DETECTED
BARBITURATES: NOT DETECTED
BENZODIAZEPINES: NOT DETECTED
Cocaine: NOT DETECTED
Opiates: NOT DETECTED
TETRAHYDROCANNABINOL: NOT DETECTED

## 2016-11-24 LAB — COMPREHENSIVE METABOLIC PANEL
ALBUMIN: 4.8 g/dL (ref 3.5–5.0)
ALT: 18 U/L (ref 17–63)
AST: 19 U/L (ref 15–41)
Alkaline Phosphatase: 88 U/L (ref 38–126)
Anion gap: 12 (ref 5–15)
BUN: 7 mg/dL (ref 6–20)
CHLORIDE: 104 mmol/L (ref 101–111)
CO2: 25 mmol/L (ref 22–32)
CREATININE: 0.88 mg/dL (ref 0.61–1.24)
Calcium: 9.6 mg/dL (ref 8.9–10.3)
GFR calc Af Amer: >60 mL/min (ref >60)
GLUCOSE: 79 mg/dL (ref 65–99)
POTASSIUM: 3.8 mmol/L (ref 3.5–5.1)
Sodium: 141 mmol/L (ref 135–145)
Total Bilirubin: 0.3 mg/dL (ref 0.3–1.2)
Total Protein: 7.9 g/dL (ref 6.5–8.1)

## 2016-11-24 LAB — VALPROIC ACID LEVEL: VALPROIC ACID LVL: 51 ug/mL (ref 50.0–100.0)

## 2016-11-24 LAB — ETHANOL

## 2016-11-24 LAB — CARBAMAZEPINE LEVEL, TOTAL

## 2016-11-24 LAB — ACETAMINOPHEN LEVEL: Acetaminophen (Tylenol), Serum: <10 ug/mL — ABNORMAL LOW (ref 10–30)

## 2016-11-24 LAB — SALICYLATE LEVEL: Salicylate Lvl: <7 mg/dL (ref 2.8–30.0)

## 2016-11-24 MED ORDER — DIVALPROEX SODIUM ER 500 MG PO TB24
500.0000 mg | ORAL_TABLET | Freq: Two times a day (BID) | ORAL | Status: DC
  Administered 2016-11-25: 500 mg via ORAL
  Filled 2016-11-24: qty 1

## 2016-11-24 MED ORDER — HYDROXYZINE HCL 25 MG PO TABS
25.0000 mg | ORAL_TABLET | Freq: Two times a day (BID) | ORAL | Status: DC | PRN
Start: 2016-11-24 — End: 2016-11-25

## 2016-11-24 MED ORDER — LORATADINE 10 MG PO TABS
10.0000 mg | ORAL_TABLET | Freq: Every day | ORAL | Status: DC
  Filled 2016-11-24 (×2): qty 1

## 2016-11-24 NOTE — BH Assessment (Addendum)
Tele Assessment Note   Alexander Fritz is an 26 y.o. male who presents to the ED voluntarily BIB his group home [facility]" by the owner Theotis Barrioerrick Hicks. Pt was assessed on 11/22/16 and d/c from [facility] on 11/23/16. Pt has 9 ED visits within the last 6 months and has [facility] counsel[facility] 7 times in the past 2 months. Pt presents c/o similar symptoms when he is seen in the ED and frequently states he wants to "kill someone." During the assessment the pt stated he has HI but denies any particular person that he wants to kill. Pt has a hx of ASD and Bipolar Affective D/O. Pt's IQ is unknown to this Clinical research associatewriter.   TTS reached out to the pt's group home for collateral information and spoke with the owner of the group home, Theotis Barrioerrick Hicks. Derrick reports during a team meeting today with the pt's legal guardian and the state, the team was attempting to address how to stop the pt from constantly running away from the group home and the pt repeatedly stated he was hearing voices and that he wanted to kill someone which is why he was brought back to the hospital.   AM psych eval per Nira ConnJason Berry, NP due to the pt's hx. TTS obtained collateral from pt's group home who states if the pt is recommended for d/c, they are willing to pick the pt up but states they are unable to pick him up until 11/25/16. Derrick [facility](GH owner) states he does not have anyone available that could pick up the pt this evening if d/c.   TTS attempted to contact Dr. Freida BusmanAllen, MD to inform of the disposition but did not get an answer. TTS spoke with the pt's nurse Juliette AlcideMelinda, RN and advised her of the disposition plan.   Diagnosis: ASD; Schizoaffective D/O  Past Medical History:  Past Medical History:  Diagnosis Date  . Autism   . Bipolar 1 disorder ([facility])     History reviewed. No pertinent surgical history.  Family History: No family history on file.  Social History:  reports that he quit smoking about 17 months ago. His smoking use  included Cigarettes. He has a 2.50 pack-year smoking history. He has quit using smokeless tobacco. He reports that he does not drink alcohol or use drugs.  Additional Social History:  Alcohol / Drug Use Pain Medications: See MAR Prescriptions: See MAR Over the Counter: See MAR History of alcohol / drug use?: No history of alcohol / drug abuse Substance #1 Name of Substance 1: NICOTINE/CIGARETTES 1 - Age of First Use: 25 1 - Amount (size/oz): UNK 1 - Frequency: pt stated "everyday" 1 - Duration: ongoing 1 - Last Use / Amount: unknown  CIWA: CIWA-Ar BP: 129/75 Pulse Rate: 76 COWS:    PATIENT STRENGTHS: (choose at least two) Financial means Physical Health  Allergies:  Allergies  Allergen Reactions  . Cefaclor Other (See Comments)    Unknown.  Per group home MAR.  Marland Kitchen. Lithium Palpitations and Rash    Home Medications:  (Not in a hospital admission)  OB/GYN Status:  No LMP for male patient.  General Assessment Data Location of Assessment: [facility] TTS Assessment: In system Is this a Tele or Face-to-Face Assessment?: Face-to-Face Is this an Initial Assessment or a Re-assessment for this encounter?: Initial Assessment Marital status: Single Is patient pregnant?: No Pregnancy Status: No Living Arrangements: Group Home Can pt return to current living arrangement?: Yes Admission Status: Voluntary Is patient capable  of signing voluntary admission?: No Referral Source: Self/Family/Friend Insurance type: Medicare     Crisis Care Plan Living Arrangements: Group Home Legal Guardian: Other: [name] ) Name of Psychiatrist: [facility] Name of Therapist: [facility]  Education Status Is patient currently in school?: No Highest grade of school patient has completed: 9th - per chart Contact person: Market researcher Guardian  Risk to self with the past 6 months Suicidal Ideation: No Has patient been a risk to self within the past 6 months prior to  admission? : No Suicidal Intent: No Has patient had any suicidal intent within the past 6 months prior to admission? : No Is patient at risk for suicide?: No Suicidal Plan?: No Has patient had any suicidal plan within the past 6 months prior to admission? : No Access to Means: No What has been your use of drugs/alcohol within the last 12 months?: denies Previous Attempts/Gestures: No Triggers for Past Attempts: None known Intentional Self Injurious Behavior: None Family Suicide History: No Recent stressful life event(s): Other (Comment) (none reported ) Persecutory voices/beliefs?: No Depression: No Substance abuse history and/or treatment for substance abuse?: No Suicide prevention information given to non-admitted patients: Not applicable  Risk to Others within the past 6 months Homicidal Ideation: Yes-Currently Present Does patient have any lifetime risk of violence toward others beyond the six months prior to admission? : Yes (comment) (pt reports he wants to kill someone "anyone") Thoughts of Harm to Others: Yes-Currently Present Comment - Thoughts of Harm to Others: pt stated he wanted to kill someone but did not have a particular person to kill  Current Homicidal Intent: Yes-Currently Present Current Homicidal Plan: No Access to Homicidal Means: No History of harm to others?: No Assessment of Violence: None Noted Does patient have access to weapons?: No Criminal Charges Pending?: No Does patient have a court date: No Is patient on probation?: No  Psychosis Hallucinations: Auditory, Visual Delusions: None noted  Mental Status Report Appearance/Hygiene: Bizarre, In scrubs Eye Contact: Good Motor Activity: Mannerisms Speech: Incoherent, Rapid, Pressured, Word salad Level of Consciousness: Alert Mood: Anxious Affect: Anxious Anxiety Level: Moderate Thought Processes: Irrelevant, Flight of Ideas, Thought Blocking Judgement: Impaired Orientation: Not  oriented Obsessive Compulsive Thoughts/Behaviors: None  Cognitive Functioning Concentration: Poor Memory: Recent Impaired, Remote Impaired IQ: Below Average Level of Function: unknown Insight: Poor Impulse Control: Poor Appetite: Good Sleep: Unable to Assess Vegetative Symptoms: None  ADLScreening [facility] Assessment Services) Patient's cognitive ability adequate to safely complete daily activities?: No Patient able to express need for assistance with ADLs?: Yes Independently performs ADLs?: Yes (appropriate for developmental age)  Prior Inpatient Therapy Prior Inpatient Therapy: Yes Prior Therapy Dates: Unknown Prior Therapy Facilty/Provider(s): [facility], [place] fear Reason for Treatment: psychosis - per chart  Prior Outpatient Therapy Prior Outpatient Therapy: Yes Prior Therapy Dates: current Prior Therapy Facilty/Provider(s): [facility]  Reason for Treatment: med management  Does patient have an ACCT team?: No Does patient have Intensive In-House Services?  : No Does patient have [facility] services? : No Does patient have P4CC services?: No  ADL Screening (condition at time of admission) Patient's cognitive ability adequate to safely complete daily activities?: No Is the patient deaf or have difficulty hearing?: No Does the patient have difficulty seeing, even when wearing glasses/contacts?: No Does the patient have difficulty concentrating, remembering, or making decisions?: Yes Patient able to express need for assistance with ADLs?: Yes Does the patient have difficulty dressing or bathing?: No Independently performs ADLs?:  Yes (appropriate for developmental age) Does the patient have difficulty walking or climbing stairs?: No Weakness of Legs: None Weakness of Arms/Hands: None       Abuse/Neglect Assessment (Assessment to be complete while patient is alone) Physical Abuse: Denies Verbal Abuse: Denies Sexual Abuse: Denies Exploitation of patient/patient's resources:  Denies Self-Neglect: Denies     Merchant navy officer (For Healthcare) Does Patient Have a Medical Advance Directive?: No Would patient like information on creating a medical advance directive?: No - Patient declined    Additional Information 1:1 In Past 12 Months?: Yes (per chart) CIRT Risk: Yes (per chart) Elopement Risk: Yes (per chart ) Does patient have medical clearance?: Yes     Disposition:  Disposition Initial Assessment Completed for this Encounter: Yes Disposition of Patient: Other dispositions Other disposition(s): Other (Comment) (AM psych eval per Nira Conn, NP)  Karolee Ohs 11/24/2016 8:20 PM

## 2016-11-24 NOTE — ED Triage Notes (Signed)
Patient states "I want to kill someone." Patient repeatedly cusses out Clinical research associatewriter. Patient is from a group home. Patient was discharged from TCU yesterday. Patient states the group home worker left.

## 2016-11-24 NOTE — ED Provider Notes (Signed)
[facility]-EMERGENCY DEPT Provider Note   CSN: [id number] Arrival date & time: 11/24/16  1450     History   Chief Complaint Chief Complaint  Patient presents with  . Homicidal  . Medical Clearance    HPI Alexander Fritz is a 26 y.o. male.  26 y/o male w/ h/o bipolar d/o and ASD presents w/ homicidal ideations Pt won't specify a perticular person, denies si Recently d/c from here for similar c/o Lives in a group home Denies current use of etoh or drugs Pt repeatly states that he doesn't want to talk to me      Past Medical History:  Diagnosis Date  . Autism   . Bipolar 1 disorder [facility](HCC)     Patient Active Problem List   Diagnosis Date Noted  . 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 Bipolar affective disorder, current episode mild (HCC) 10/05/2016  . Autism spectrum disorder 08/26/2016    History reviewed. No pertinent surgical history.     Home Medications    Prior to Admission medications   Medication Sig Start Date End Date Taking? Authorizing Provider  carbamazepine (TEGRETOL) 200 MG tablet Take 1 tablet (200 mg total) by mouth 2 (two) times daily. (0800 & 2000) Patient not taking: Reported on 10/04/2016 09/18/16   Charm RingsLord, Jamison Y, NP  cetirizine (ZYRTEC) 10 MG tablet Take 10 mg by mouth daily. (0800)    [provider]  divalproex (DEPAKOTE ER) 500 MG 24 hr tablet Take 500 mg by mouth 2 (two) times daily. (0800 & 2000)    [provider]  divalproex (DEPAKOTE) 500 MG DR tablet Take 1 tablet (500 mg total) by mouth 2 (two) times daily. 11/23/16   Charm RingsLord, Jamison Y, NP  docusate sodium (COLACE) 100 MG capsule Take 100 mg by mouth every morning.     [provider]  haloperidol (HALDOL) 1 MG tablet Take 1 tablet (1 mg total) by mouth 2 (two) times daily. 11/23/16   Charm RingsLord, Jamison Y, NP  haloperidol decanoate (HALDOL DECANOATE) 100 MG/ML injection Inject 0.5 mLs (50 mg total) into the muscle every 30 (thirty)  days. Patient not taking: Reported on 10/10/2016 10/09/16   Charm RingsLord, Jamison Y, NP  haloperidol decanoate (HALDOL DECANOATE) 100 MG/ML injection Inject 0.5 mLs (50 mg total) into the muscle every 30 (thirty) days. 12/23/16   Charm RingsLord, Jamison Y, NP  hydrOXYzine (ATARAX/VISTARIL) 25 MG tablet Take 1 tablet (25 mg total) by mouth 2 (two) times daily as needed for anxiety (agitation). 11/23/16   Charm RingsLord, Jamison Y, NP  risperiDONE (RISPERDAL) 2 MG tablet Take 1 tablet (2 mg total) by mouth 2 (two) times daily. Patient not taking: Reported on 10/04/2016 09/18/16   Charm RingsLord, Jamison Y, NP  risperidone (RISPERDAL) 4 MG tablet Take 4 mg by mouth at bedtime. (2000)    [provider]  trihexyphenidyl (ARTANE) 5 MG tablet Take 1 tablet (5 mg total) by mouth 2 (two) times daily with breakfast and lunch. (0800 & 2000) 11/23/16   Charm RingsLord, Jamison Y, NP  vitamin B-12 (CYANOCOBALAMIN) 1000 MCG tablet Take 1,000 mcg by mouth at bedtime. (20:00)    [provider]    Family History No family history on file.  Social History Social History  Substance Use Topics  . Smoking status: Former Smoker    Packs/day: 0.50    Years: 5.00    Types: Cigarettes    Quit date: 06/23/2015  . Smokeless tobacco: Former NeurosurgeonUser  . Alcohol use  No     Allergies   Cefaclor and Lithium   Review of Systems Review of Systems  Unable to perform ROS: Psychiatric disorder     Physical Exam Updated Vital Signs BP (!) 143/94 (BP Location: Left Arm)   Pulse (!) 107   Temp 97.7 F (36.5 C) (Oral)   Resp 18   Ht 1.727 m (5\' 8" )   Wt 67.1 kg (148 lb)   SpO2 100%   BMI 22.50 kg/m   Physical Exam  Constitutional: He is oriented to person, place, and time. He appears well-developed and well-nourished.  Non-toxic appearance. No distress.  HENT:  Head: Normocephalic and atraumatic.  Eyes: Conjunctivae, EOM and lids are normal. Pupils are equal, round, and reactive to light.  Neck: Normal range of motion. Neck supple. No tracheal  deviation present. No thyroid mass present.  Cardiovascular: Normal rate, regular rhythm and normal heart sounds.  Exam reveals no gallop.   No murmur heard. Pulmonary/Chest: Effort normal and breath sounds normal. No stridor. No respiratory distress. He has no decreased breath sounds. He has no wheezes. He has no rhonchi. He has no rales.  Abdominal: Soft. Normal appearance and bowel sounds are normal. He exhibits no distension. There is no tenderness. There is no rebound and no CVA tenderness.  Musculoskeletal: Normal range of motion. He exhibits no edema or tenderness.  Neurological: He is alert and oriented to person, place, and time. He has normal strength. No cranial nerve deficit or sensory deficit. GCS eye subscore is 4. GCS verbal subscore is 5. GCS motor subscore is 6.  Skin: Skin is warm and dry. No abrasion and no rash noted.  Psychiatric: His affect is labile. His speech is tangential. He is agitated. He expresses impulsivity and inappropriate judgment. He expresses homicidal ideation. He expresses no suicidal plans. He is inattentive.  Nursing note and vitals reviewed.    ED Treatments / Results  Labs (all labs ordered are listed, but only abnormal results are displayed) Labs Reviewed  COMPREHENSIVE METABOLIC PANEL  ETHANOL  SALICYLATE LEVEL  ACETAMINOPHEN LEVEL  CBC  RAPID URINE DRUG SCREEN, HOSP PERFORMED    EKG  EKG Interpretation None       Radiology No results found.  Procedures Procedures (including critical care time)  Medications Ordered in ED Medications - No data to display   Initial Impression / Assessment and Plan / ED Course  I have reviewed the triage vital signs and the nursing notes.  Pertinent labs & imaging results that were available during my care of the patient were reviewed by me and considered in my medical decision making (see chart for details).     Pt has been medically cleared for psy disposition  Final Clinical  Impressions(s) / ED Diagnoses   Final diagnoses:  None    New Prescriptions New Prescriptions   No medications on file     Lorre Nick, MD 11/27/16 1031

## 2016-11-25 ENCOUNTER — Encounter (HOSPITAL_COMMUNITY): Payer: Self-pay | Admitting: Registered Nurse

## 2016-11-25 DIAGNOSIS — R4589 Other symptoms and signs involving emotional state: Secondary | ICD-10-CM | POA: Diagnosis not present

## 2016-11-25 MED ORDER — LORAZEPAM 2 MG/ML IJ SOLN: 1.0000 mg | Freq: Once | INTRAMUSCULAR | Status: DC

## 2016-11-25 MED ORDER — HALOPERIDOL LACTATE 5 MG/ML IJ SOLN: 5.0000 mg | Freq: Once | INTRAMUSCULAR | Status: DC

## 2016-11-25 MED ORDER — DIVALPROEX SODIUM 500 MG PO DR TAB: 500.0000 mg | DELAYED_RELEASE_TABLET | Freq: Two times a day (BID) | ORAL | 0 refills | Status: DC
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
## 2016-11-25 NOTE — Progress Notes (Signed)
CSW attempted to contact patient's legal guardian [name]([name] [phone]), no answer and voicemail full. CSW contacted after hours number [phone]). CSW received return call from staff member Jamey RipaDenise Pell [phone]). CSW informed staff that patient was arrested by [employer], she agreed to notify patient's legal guardian via email.   Celso SickleKimberly Taejah Ohalloran, LCSWA [facility]  Clinical Social Worker [phone]
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
## 2016-11-25 NOTE — Discharge Instructions (Signed)
Take depakote as prescribed.   See your doctor  Return to ER if you have thoughts of harming yourself or others, hallucinations.
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
## 2017-01-30 ENCOUNTER — Encounter (HOSPITAL_COMMUNITY): Payer: Self-pay | Admitting: Nurse Practitioner

## 2017-01-30 ENCOUNTER — Emergency Department (HOSPITAL_COMMUNITY)
Admission: EM | Admit: 2017-01-30 | Discharge: 2017-01-31 | Disposition: A | Discharge status: Home / Self Care | Payer: Medicare Other | Attending: Emergency Medicine | Admitting: Emergency Medicine

## 2017-01-30 DIAGNOSIS — R440 Auditory hallucinations: Secondary | ICD-10-CM | POA: Diagnosis not present

## 2017-01-30 DIAGNOSIS — Z79899 Other long term (current) drug therapy: Secondary | ICD-10-CM | POA: Insufficient documentation

## 2017-01-30 DIAGNOSIS — F4325 Adjustment disorder with mixed disturbance of emotions and conduct: Secondary | ICD-10-CM | POA: Diagnosis not present

## 2017-01-30 DIAGNOSIS — F17210 Nicotine dependence, cigarettes, uncomplicated: Secondary | ICD-10-CM | POA: Diagnosis not present

## 2017-01-30 DIAGNOSIS — F319 Bipolar disorder, unspecified: Secondary | ICD-10-CM | POA: Insufficient documentation

## 2017-01-30 DIAGNOSIS — F840 Autistic disorder: Secondary | ICD-10-CM | POA: Diagnosis not present

## 2017-01-30 LAB — CBC
HEMATOCRIT: 37.8 % — AB (ref 39.0–52.0)
HEMOGLOBIN: 12.8 g/dL — AB (ref 13.0–17.0)
MCH: 28.1 pg (ref 26.0–34.0)
MCHC: 33.9 g/dL (ref 30.0–36.0)
MCV: 83.1 fL (ref 78.0–100.0)
Platelets: 303 10*3/uL (ref 150–400)
RBC: 4.55 MIL/uL (ref 4.22–5.81)
RDW: 12.8 % (ref 11.5–15.5)
WBC: 3 10*3/uL — AB (ref 4.0–10.5)

## 2017-01-30 LAB — ACETAMINOPHEN LEVEL

## 2017-01-30 LAB — COMPREHENSIVE METABOLIC PANEL
ALK PHOS: 66 U/L (ref 38–126)
ALT: 9 U/L — AB (ref 17–63)
AST: 16 U/L (ref 15–41)
Albumin: 3.9 g/dL (ref 3.5–5.0)
Anion gap: 9 (ref 5–15)
BUN: 7 mg/dL (ref 6–20)
CALCIUM: 9.1 mg/dL (ref 8.9–10.3)
CHLORIDE: 105 mmol/L (ref 101–111)
CO2: 25 mmol/L (ref 22–32)
CREATININE: 0.77 mg/dL (ref 0.61–1.24)
Glucose, Bld: 110 mg/dL — ABNORMAL HIGH (ref 65–99)
Potassium: 3.6 mmol/L (ref 3.5–5.1)
Sodium: 139 mmol/L (ref 135–145)
Total Bilirubin: 0.2 mg/dL — ABNORMAL LOW (ref 0.3–1.2)
Total Protein: 7.2 g/dL (ref 6.5–8.1)

## 2017-01-30 LAB — RAPID URINE DRUG SCREEN, HOSP PERFORMED
Amphetamines: NOT DETECTED
BARBITURATES: NOT DETECTED
BENZODIAZEPINES: NOT DETECTED
COCAINE: NOT DETECTED
OPIATES: NOT DETECTED
TETRAHYDROCANNABINOL: NOT DETECTED

## 2017-01-30 LAB — ETHANOL

## 2017-01-30 LAB — SALICYLATE LEVEL

## 2017-01-30 MED ORDER — DIVALPROEX SODIUM 250 MG PO DR TAB
500.0000 mg | DELAYED_RELEASE_TABLET | Freq: Two times a day (BID) | ORAL | Status: DC
  Administered 2017-01-30 – 2017-01-31 (×2): 500 mg via ORAL
  Filled 2017-01-30 (×2): qty 2

## 2017-01-30 MED ORDER — HALOPERIDOL 1 MG PO TABS
1.0000 mg | ORAL_TABLET | Freq: Two times a day (BID) | ORAL | Status: DC
  Administered 2017-01-30 – 2017-01-31 (×2): 1 mg via ORAL
  Filled 2017-01-30 (×2): qty 1

## 2017-01-30 MED ORDER — DOCUSATE SODIUM 100 MG PO CAPS
100.0000 mg | ORAL_CAPSULE | Freq: Every morning | ORAL | Status: DC
  Administered 2017-01-31: 100 mg via ORAL
  Filled 2017-01-30: qty 1

## 2017-01-30 MED ORDER — LORAZEPAM 1 MG PO TABS
1.0000 mg | ORAL_TABLET | Freq: Once | ORAL | Status: AC
  Administered 2017-01-30: 1 mg via ORAL
  Filled 2017-01-30: qty 1

## 2017-01-30 MED ORDER — HYDROXYZINE HCL 25 MG PO TABS: 25.0000 mg | ORAL_TABLET | Freq: Two times a day (BID) | ORAL | Status: DC | PRN

## 2017-01-30 MED ORDER — LORATADINE 10 MG PO TABS
10.0000 mg | ORAL_TABLET | Freq: Every day | ORAL | Status: DC
  Administered 2017-01-31: 10 mg via ORAL
  Filled 2017-01-30: qty 1

## 2017-01-30 MED ORDER — CARBAMAZEPINE 200 MG PO TABS
200.0000 mg | ORAL_TABLET | Freq: Two times a day (BID) | ORAL | Status: DC
  Administered 2017-01-30 – 2017-01-31 (×2): 200 mg via ORAL
  Filled 2017-01-30 (×2): qty 1

## 2017-01-30 MED ORDER — TRIHEXYPHENIDYL HCL 5 MG PO TABS
5.0000 mg | ORAL_TABLET | Freq: Two times a day (BID) | ORAL | Status: DC
  Administered 2017-01-31: 5 mg via ORAL
  Filled 2017-01-30 (×2): qty 1

## 2017-01-30 MED ORDER — RISPERIDONE 1 MG PO TABS
2.0000 mg | ORAL_TABLET | Freq: Two times a day (BID) | ORAL | Status: DC
  Administered 2017-01-30 – 2017-01-31 (×2): 2 mg via ORAL
  Filled 2017-01-30 (×2): qty 2

## 2017-01-30 NOTE — ED Triage Notes (Addendum)
Pt presents with c/o suicidal thoughts. He reports hearing voices for about a week now. The voices are scary and have been telling him bad things. He's had thoughts of hurting himself but will not state a plan. He denies thoughts of harming others. He ran away from his group home to come here today. He denies any substance use.
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
## 2017-01-30 NOTE — ED Notes (Signed)
Ordered regular dinner tray for pt.   

## 2017-01-30 NOTE — ED Provider Notes (Signed)
[facility]-EM[facility]Y DEPT Provider Note   CSN: [id number] Arrival date & time: 01/30/17  1515     History   Chief Complaint Chief Complaint  Patient presents with  . Suicidal    HPI Alexander Fritz is a 26 y.o. male.  HPI   26 year old male with history of autism and bipolar presents today with complaints of auditory hallucinations.  Patient reports that he has been hearing voices echoing in his head.  He will not provide any information as to what they are saying to him.  He notes he has had previous history of the same, previous history of commands to kill himself.  He denies any suicidal or homicidal ideations presently.  Patient notes he has been using his home meds as directed.  He denies any physical complaints today.  Past Medical History:  Diagnosis Date  . Autism   . Bipolar 1 disorder [facility])     Patient Active Problem List   Diagnosis Date Noted  . 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 Bipolar affective disorder, current episode mild (HCC) 10/05/2016  . Autism spectrum disorder 08/26/2016    History reviewed. No pertinent surgical history.     Home Medications    Prior to Admission medications   Medication Sig Start Date End Date Taking? Authorizing Provider  carbamazepine (TEGRETOL) 200 MG tablet Take 1 tablet (200 mg total) by mouth 2 (two) times daily. (0800 & 2000) Patient not taking: Reported on 10/04/2016 09/18/16   Charm Rings, NP  cetirizine (ZYRTEC) 10 MG tablet Take 10 mg by mouth daily. (0800)    [provider]  divalproex (DEPAKOTE) 500 MG DR tablet Take 1 tablet (500 mg total) by mouth 2 (two) times daily. 11/25/16   Charlynne Pander, MD  docusate sodium (COLACE) 100 MG capsule Take 100 mg by mouth every morning.     [provider]  haloperidol (HALDOL) 1 MG tablet Take 1 tablet (1 mg total) by mouth 2 (two) times daily. 11/23/16   Charm Rings, NP  haloperidol decanoate (HALDOL DECANOATE)  100 MG/ML injection Inject 0.5 mLs (50 mg total) into the muscle every 30 (thirty) days. Patient not taking: Reported on 10/10/2016 10/09/16   Charm Rings, NP  haloperidol decanoate (HALDOL DECANOATE) 100 MG/ML injection Inject 0.5 mLs (50 mg total) into the muscle every 30 (thirty) days. 12/23/16   Charm Rings, NP  hydrOXYzine (ATARAX/VISTARIL) 25 MG tablet Take 1 tablet (25 mg total) by mouth 2 (two) times daily as needed for anxiety (agitation). 11/23/16   Charm Rings, NP  risperiDONE (RISPERDAL) 2 MG tablet Take 1 tablet (2 mg total) by mouth 2 (two) times daily. Patient not taking: Reported on 10/04/2016 09/18/16   Charm Rings, NP  risperidone (RISPERDAL) 4 MG tablet Take 4 mg by mouth at bedtime. (2000)    [provider]  trihexyphenidyl (ARTANE) 5 MG tablet Take 1 tablet (5 mg total) by mouth 2 (two) times daily with breakfast and lunch. (0800 & 2000) 11/23/16   Charm Rings, NP  vitamin B-12 (CYANOCOBALAMIN) 1000 MCG tablet Take 1,000 mcg by mouth at bedtime. (20:00)    [provider]    Family History History reviewed. No pertinent family history.  Social History Social History  Substance Use Topics  . Smoking status: Current Every Day Smoker    Packs/day: 0.50    Years: 5.00    Types: Cigarettes  . Smokeless tobacco: Former Neurosurgeon  .  Alcohol use No     Allergies   Cefaclor and Lithium   Review of Systems Review of Systems  All other systems reviewed and are negative.    Physical Exam Updated Vital Signs BP 117/61 (BP Location: Left Arm)   Pulse (!) 111   Temp 97.6 F (36.4 C) (Oral)   Resp 16   Ht 5\' 8"  (1.727 m)   Wt 57.6 kg (127 lb)   SpO2 100%   BMI 19.31 kg/m   Physical Exam  Constitutional: He is oriented to person, place, and time. He appears well-developed and well-nourished.  HENT:  Head: Normocephalic and atraumatic.  Eyes: Pupils are equal, round, and reactive to light. Conjunctivae are normal. Right eye exhibits no  discharge. Left eye exhibits no discharge. No scleral icterus.  Neck: Normal range of motion. No JVD present. No tracheal deviation present.  Pulmonary/Chest: Effort normal. No stridor.  Neurological: He is alert and oriented to person, place, and time. Coordination normal.  Psychiatric: He has a normal mood and affect. His behavior is normal. Judgment and thought content normal.  Nursing note and vitals reviewed.    ED Treatments / Results  Labs (all labs ordered are listed, but only abnormal results are displayed) Labs Reviewed  COMPREHENSIVE METABOLIC PANEL - Abnormal; Notable for the following:       Result Value   Glucose, Bld 110 (*)    ALT 9 (*)    Total Bilirubin 0.2 (*)    All other components within normal limits  ACETAMINOPHEN LEVEL - Abnormal; Notable for the following:    Acetaminophen (Tylenol), Serum <10 (*)    All other components within normal limits  CBC - Abnormal; Notable for the following:    WBC 3.0 (*)    Hemoglobin 12.8 (*)    HCT 37.8 (*)    All other components within normal limits  ETHANOL  SALICYLATE LEVEL  RAPID URINE DRUG SCREEN, HOSP PERFORMED    EKG  EKG Interpretation None       Radiology No results found.  Procedures Procedures (including critical care time)  Medications Ordered in ED Medications - No data to display   Initial Impression / Assessment and Plan / ED Course  I have reviewed the triage vital signs and the nursing notes.  Pertinent labs & imaging results that were available during my care of the patient were reviewed by me and considered in my medical decision making (see chart for details).      Final Clinical Impressions(s) / ED Diagnoses   Final diagnoses:  Auditory hallucination    26 year old male with auditory hallucinations.  He is medically cleared awaiting TTS evaluation.  New Prescriptions New Prescriptions   No medications on file     Rosalio LoudHedges, Londan Coplen, PA-C 01/30/17 2022    Mancel BaleWentz,  Elliott, MD 01/30/17 2230

## 2017-01-30 NOTE — ED Notes (Signed)
[facility] Pt to have psych eval in AM, most likely for d/c back to group home.

## 2017-01-30 NOTE — ED Notes (Signed)
ED Provider at bedside. 

## 2017-01-30 NOTE — ED Notes (Addendum)
Belongings inventoried. One bag of clothing placed into [facility]. Nothing with Security or Pharmacy.

## 2017-01-30 NOTE — [facility] Counselor (Addendum)
Called phone number for Alexander Fritz, [facility]  [facility]. No answer. Left HIPPA complaint message for a callback.

## 2017-01-31 ENCOUNTER — Encounter (HOSPITAL_COMMUNITY): Payer: Self-pay | Admitting: Registered Nurse

## 2017-01-31 DIAGNOSIS — F17210 Nicotine dependence, cigarettes, uncomplicated: Secondary | ICD-10-CM

## 2017-01-31 DIAGNOSIS — F840 Autistic disorder: Secondary | ICD-10-CM

## 2017-01-31 DIAGNOSIS — R440 Auditory hallucinations: Secondary | ICD-10-CM

## 2017-01-31 NOTE — Progress Notes (Signed)
CSW spoke with [facility] START representative,  Leida LauthKelsey Atkinson [phone] .    Per Adelina MingsKelsey, the patient was calm and cooperative during her assessment. She stated that the patient is currently denying SI,HI, AVH.   [facility] also [facility] that he felt safe returning to his group home and plans to be compliant with programming and his medications.   Leida LauthKelsey Atkinson stated that she recommends that the patient returns to his group home and continue to follow up with his outpatient provider.   Patient is awaiting AM Psych Eval.   CSW will continue to follow for a disposition.   Baldo DaubJolan Keevin Panebianco MSW, LCSWA CSW Disposition [phone]

## 2017-01-31 NOTE — Progress Notes (Signed)
Per [name], NP, the patient does not meet criteria for inpatient treatment. Patient is psych cleared. Patient is recommended for discharge and to follow up with current outpatient providers.   [facility]. Group Home owner is Alexander Fritz [phone]).   Patient's legal guardian is Alexander Fritz [phone]).   CSW spoke with Mr. [name] to notify him that the patient was recommended for discharge. Per Mr. [name], group home staff will be to pick the patient up within the next hours.    CSW also attempted to notify the patient's legal guardian of the disposition. However the two numbers provided [phone] are not the correct contacts for Mr. Alexander Fritz.   Alexander CableElizabeth Poulose, RN notified.   [name] MSW, LCSWA CSW Disposition [phone]

## 2017-01-31 NOTE — ED Notes (Signed)
Breakfast tray given , appetite good.

## 2017-01-31 NOTE — Progress Notes (Signed)
CSW received a phone call from Suzzette RighterAmanda Gross with [facility] about [facility]. Marchelle Folksmanda asked for update information on pt care, however CSW was unable to give Marchelle Folksmanda anything new for today. CSW provided her with the disposition CSW to get further details on care for pt at this time  Claude MangesKierra S. Evon Lopezperez, MSW, LCSW-A Emergency Department Clinical Social Worker [phone]

## 2017-01-31 NOTE — ED Notes (Signed)
Called patient's group home. Spoke with Mr. [name]. He stated that he was unaware that patient was to be discharged today. RN informed him that patient has been discharged, and needs to be picked up for transport back to group home. Mr. [name] stated that he is unable to come at this precise moment, but will check to have him picked up in 30-60 minutes. RN provided direct number for return call regarding when patient will be picked up.

## 2017-01-31 NOTE — Progress Notes (Signed)
Patient is recommended for an AM Psych Evaluation today.   Disposition CSW received a phone call from Leida LauthKelsey Atkinson with [facility] regarding an update on the patient. Per Adelina MingsKelsey, [facility] an [facility] on the patient today.   [facility] - Leida LauthKelsey Atkinson, [phone].   She stated that [facility] within the hour and 30 minutes. ETA 1:00pm.   CSW informed AM Psych Evaluation provider, Assunta FoundShuvon Rankin, NP, [facility] CSW, Hortencia PilarKierra Wiley, [place]CS[place] , and nurse Wyatt MageElizabeth Pouolse, RN.    CSW will continue to follow for a disposition.    Baldo DaubJolan Shanyia Stines MSW, LCSWA CSW Disposition [phone]

## 2017-01-31 NOTE — ED Notes (Signed)
[facility] START visitor assessing patient

## 2017-01-31 NOTE — Consult Note (Signed)
Telepsych Consultation   Reason for Consult:  Auditory hallucinations Referring Physician:  Okey Regal, PA-C Location of [facility]: [facility] Location of Provider: [facility]  Patient Identification: Alexander Fritz MRN:  [id number] Principal Diagnosis: Auditory hallucinations Diagnosis:   Patient Active Problem List   Diagnosis Date Noted  . Auditory hallucinations [R44.0] 01/31/2017  . Adjustment disorder with mixed disturbance of emotions and conduct [F43.25] 11/23/2016  . Bipolar affective disorder, current episode mild ([place]) [F31.9] 10/05/2016  . Autism spectrum disorder [F84.0] 08/26/2016    Total Time spent with patient: 45 minutes  Subjective:   Alexander Fritz is a 26 y.o. male patient presented to [facility] with complaints of auditory hallucinations  HPI:  Alexander Fritz, 26 y.o., male patient seen by this provider on 01/31/17.  Chart reviewed and consulted with Dr. Dwyane Dee.  On evaluation Alexander Fritz reports that auditory hallucinations had worsened.  States that he is feeling much better.  Continues to endorse auditory hallucinations but states "that's kind of normal for me.  Not like they was."  reports that he is taking and tolerating his medications; sleeping/eating without difficulty.  At this time patent denies suicidal/homicidal ideation and paranoia.  Patient is calm and cooperative during interview.     Past Psychiatric History: Bipolar affective disorder  Risk to Self: Suicidal Ideation: No (DENIES) Suicidal Intent: No Is patient at risk for suicide?: No Suicidal Plan?: No Access to Means: No What has been your use of drugs/alcohol within the last 12 months?:  (NONE) How many times?:  (0) Other Self Harm Risks:  (NONE REPORTED) Triggers for Past Attempts: None known Intentional Self Injurious Behavior: None Risk to Others: Homicidal Ideation: No (DENIES) Thoughts of Harm to Others: No (DENIES) Current Homicidal Intent: No (DENIES) Current Homicidal Plan:  No Access to Homicidal Means: No Identified Victim:  (NONE REPORTED) History of harm to others?: Yes Assessment of Violence: In past 6-12 months Violent Behavior Description:  (HX OF AGGRESSION & MAKING THREATS OF HARM TO OTHERS) Does patient have access to weapons?: No Criminal Charges Pending?: No Does patient have a court date: No Prior Inpatient Therapy: Prior Inpatient Therapy: Yes Prior Therapy Dates:  (MULTIPLE) Prior Therapy Facilty/Provider(s):  ([facility] [place]) Reason for Treatment:  (PSYCHOSIS) Prior Outpatient Therapy: Prior Outpatient Therapy: Yes Prior Therapy Dates:  (UNKNOWN) Prior Therapy Facilty/Provider(s):  (UNKNOWN) Reason for Treatment:  (UNKNOWN) Does patient have an ACCT team?: No Does patient have Intensive In-House Services?  : No Does patient have [facility] services? : No Does patient have P4CC services?: No  Past Medical History:  Past Medical History:  Diagnosis Date  . Autism   . Bipolar 1 disorder ([place])    History reviewed. No pertinent surgical history. Family History: History reviewed. No pertinent family history. Family Psychiatric  History: Unaware Social History:  History  Alcohol Use No     History  Drug Use No    Social History   Social History  . Marital status: Single    Spouse name: N/A  . Number of children: N/A  . Years of education: N/A   Social History Main Topics  . Smoking status: Current Every Day Smoker    Packs/day: 0.50    Years: 5.00    Types: Cigarettes  . Smokeless tobacco: Former Systems developer  . Alcohol use No  . Drug use: No  . Sexual activity: Not Asked   Other Topics Concern  . None   Social History Narrative  . None   Additional Social History:  Allergies:   Allergies  Allergen Reactions  . Cefaclor Other (See Comments)    Unknown.  Per group home MAR.  Marland Kitchen Lithium Palpitations and Rash    Labs:  Results for orders placed or performed during the hospital encounter of 01/30/17 (from the past 48  hour(s))  Comprehensive metabolic panel     Status: Abnormal   Collection Time: 01/30/17  3:42 PM  Result Value Ref Range   Sodium 139 135 - 145 mmol/L   Potassium 3.6 3.5 - 5.1 mmol/L   Ch[place] 105 101 - 111 mmol/L   CO2 25 22 - 32 mmol/L   Glucose, Bld 110 (H) 65 - 99 mg/dL   BUN 7 6 - 20 mg/dL   Creatinine, Ser 0.77 0.61 - 1.24 mg/dL   Calcium 9.1 8.9 - 10.3 mg/dL   Total Protein 7.2 6.5 - 8.1 g/dL   Albumin 3.9 3.5 - 5.0 g/dL   AST 16 15 - 41 U/L   ALT 9 (L) 17 - 63 U/L   Alkaline Phosphatase 66 38 - 126 U/L   Total Bilirubin 0.2 (L) 0.3 - 1.2 mg/dL   GFR calc non Af Amer >60 >60 mL/min   GFR calc Af Amer >60 >60 mL/min    Comment: (NOTE) The eGFR has been calculated using the CKD EPI equation. This calculation has not been validated in all clinical situations. eGFR's persistently <60 mL/min signify possible Chronic Kidney Disease.    Anion gap 9 5 - 15  cbc     Status: Abnormal   Collection Time: 01/30/17  3:42 PM  Result Value Ref Range   WBC 3.0 (L) 4.0 - 10.5 K/uL   RBC 4.55 4.22 - 5.81 MIL/uL   Hemoglobin 12.8 (L) 13.0 - 17.0 g/dL   HCT 37.8 (L) 39.0 - 52.0 %   MCV 83.1 78.0 - 100.0 fL   MCH 28.1 26.0 - 34.0 pg   MCHC 33.9 30.0 - 36.0 g/dL   RDW 12.8 11.5 - 15.5 %   Platelets 303 150 - 400 K/uL  Ethanol     Status: None   Collection Time: 01/30/17  3:47 PM  Result Value Ref Range   Alcohol, Ethyl (B) <5 <5 mg/dL    Comment:        LOWEST DETECTABLE LIMIT FOR SERUM ALCOHOL IS 5 mg/dL FOR MEDICAL PURPOSES ONLY   Salicylate level     Status: None   Collection Time: 01/30/17  3:47 PM  Result Value Ref Range   Salicylate Lvl <7.4 2.8 - 30.0 mg/dL  Acetaminophen level     Status: Abnormal   Collection Time: 01/30/17  3:47 PM  Result Value Ref Range   Acetaminophen (Tylenol), Serum <10 (L) 10 - 30 ug/mL    Comment:        THERAPEUTIC CONCENTRATIONS VARY SIGNIFICANTLY. A RANGE OF 10-30 ug/mL MAY BE AN EFFECTIVE CONCENTRATION FOR MANY PATIENTS. HOWEVER,  SOME ARE BEST TREATED AT CONCENTRATIONS OUTSIDE THIS RANGE. ACETAMINOPHEN CONCENTRATIONS >150 ug/mL AT 4 HOURS AFTER INGESTION AND >50 ug/mL AT 12 HOURS AFTER INGESTION ARE OFTEN ASSOCIATED WITH TOXIC REACTIONS.   Rapid urine drug screen (hospital performed)     Status: None   Collection Time: 01/30/17  4:06 PM  Result Value Ref Range   Opiates NONE DETECTED NONE DETECTED   Cocaine NONE DETECTED NONE DETECTED   Benzodiazepines NONE DETECTED NONE DETECTED   Amphetamines NONE DETECTED NONE DETECTED   Tetrahydrocannabinol NONE DETECTED NONE DETECTED   Barbiturates NONE DETECTED NONE  DETECTED    Comment:        DRUG SCREEN FOR MEDICAL PURPOSES ONLY.  IF CONFIRMATION IS NEEDED FOR ANY PURPOSE, NOTIFY LAB WITHIN 5 DAYS.        LOWEST DETECTABLE LIMITS FOR URINE DRUG SCREEN Drug Class       Cutoff (ng/mL) Amphetamine      1000 Barbiturate      200 Benzodiazepine   794 Tricyclics       801 Opiates          300 Cocaine          300 THC              50     Medications:  Current Facility-Administered Medications  Medication Dose Route Frequency Provider Last Rate Last Dose  . carbamazepine (TEGRETOL) tablet 200 mg  200 mg Oral BID Muthersbaugh, Hannah, PA-C   200 mg at 01/31/17 1117  . divalproex (DEPAKOTE) DR tablet 500 mg  500 mg Oral BID Muthersbaugh, Jarrett Soho, PA-C   500 mg at 01/31/17 1117  . docusate sodium (COLACE) capsule 100 mg  100 mg Oral q morning - 10a Muthersbaugh, Jarrett Soho, PA-C   100 mg at 01/31/17 1117  . haloperidol (HALDOL) tablet 1 mg  1 mg Oral BID Muthersbaugh, Jarrett Soho, PA-C   1 mg at 01/31/17 1117  . hydrOXYzine (ATARAX/VISTARIL) tablet 25 mg  25 mg Oral BID PRN Muthersbaugh, Jarrett Soho, PA-C      . loratadine (CLARITIN) tablet 10 mg  10 mg Oral Daily Muthersbaugh, Hannah, PA-C   10 mg at 01/31/17 1117  . risperiDONE (RISPERDAL) tablet 2 mg  2 mg Oral BID Muthersbaugh, Jarrett Soho, PA-C   2 mg at 01/31/17 1117  . trihexyphenidyl (ARTANE) tablet 5 mg  5 mg Oral BID WC  Muthersbaugh, Hannah, PA-C   5 mg at 01/31/17 1116   Current Outpatient Prescriptions  Medication Sig Dispense Refill  . cetirizine (ZYRTEC) 10 MG tablet Take 10 mg by mouth daily. (0800)    . divalproex (DEPAKOTE) 500 MG DR tablet Take 1 tablet (500 mg total) by mouth 2 (two) times daily. 60 tablet 0  . docusate sodium (COLACE) 100 MG capsule Take 100 mg by mouth every morning.     . hydrOXYzine (ATARAX/VISTARIL) 25 MG tablet Take 1 tablet (25 mg total) by mouth 2 (two) times daily as needed for anxiety (agitation). 60 tablet 0  . trihexyphenidyl (ARTANE) 5 MG tablet Take 1 tablet (5 mg total) by mouth 2 (two) times daily with breakfast and lunch. (0800 & 2000) 60 tablet 0  . carbamazepine (TEGRETOL) 200 MG tablet Take 1 tablet (200 mg total) by mouth 2 (two) times daily. (0800 & 2000) (Patient not taking: Reported on 10/04/2016) 60 tablet 0  . haloperidol (HALDOL) 1 MG tablet Take 1 tablet (1 mg total) by mouth 2 (two) times daily. 60 tablet 0  . haloperidol decanoate (HALDOL DECANOATE) 100 MG/ML injection Inject 0.5 mLs (50 mg total) into the muscle every 30 (thirty) days. 0.5 mL 0  . risperiDONE (RISPERDAL) 2 MG tablet Take 1 tablet (2 mg total) by mouth 2 (two) times daily. (Patient not taking: Reported on 10/04/2016) 60 tablet 0    Musculoskeletal: Strength & Muscle Tone: within normal limits Gait & Station: normal Patient leans: N/A  Psychiatric Specialty Exam: Physical Exam  ROS  Blood pressure (!) 102/52, pulse 67, temperature 97.8 F (36.6 C), temperature source Oral, resp. rate 16, height 5' 8" (1.727 m), weight 57.6 kg (127  lb), SpO2 100 %.Body mass index is 19.31 kg/m.  General Appearance: Casual and Fairly Groomed  Eye Contact:  Good  Speech:  Clear and Coherent and Normal Rate  Volume:  Normal  Mood:  "Feel better"  Affect:  Appropriate  Thought Process:  Coherent and Descriptions of Associations: Intact  Orientation:  Full (Time, Place, and Person)  Thought Content:   Hallucinations: Auditory Reports that he continues to have auditory hallucinations but not as bad as before "but that is normal"  Suicidal Thoughts:  No  Homicidal Thoughts:  No  Memory:  Immediate;   Good Recent;   Good Remote;   Good  Judgement:  Fair  Insight:  Fair  Psychomotor Activity:  Normal  Concentration:  Concentration: Good and Attention Span: Good  Recall:  Good  Fund of Knowledge:  Fair  Language:  Good  Akathisia:  No  Handed:  Right  AIMS (if indicated):     Assets:  Housing Leisure Time Resilience Social Support  ADL's:  Intact  Cognition:  Impaired,  Mild  Sleep:        Treatment Plan Summary: Plan Discharge back to group home to follow up with primary psychiatric provider  Disposition: No evidence of imminent risk to self or others at present.   Patient does not meet criteria for psychiatric inpatient admission.  This service was provided via telemedicine using a 2-way, interactive audio and video technology.  Names of all persons participating in this telemedicine service and their role in this encounter. Name: Earleen Newport NP Role: Telepsych  Name: Dr Dwyane Dee Role: Psychiatrist  Name:  Role:   Name:  Role:     Earleen [place], NP 01/31/2017 2:39 PM

## 2017-01-31 NOTE — ED Notes (Signed)
Mr. Alexander Fritz came to ED to pick up patient to transport patient back to group home.

## 2017-02-11 ENCOUNTER — Encounter (HOSPITAL_COMMUNITY): Payer: Self-pay | Admitting: Emergency Medicine

## 2017-02-11 ENCOUNTER — Emergency Department (HOSPITAL_COMMUNITY)
Admission: EM | Admit: 2017-02-11 | Discharge: 2017-02-12 | Disposition: A | Discharge status: Home / Self Care | Payer: Medicare Other | Attending: Emergency Medicine | Admitting: Emergency Medicine

## 2017-02-11 DIAGNOSIS — Z79899 Other long term (current) drug therapy: Secondary | ICD-10-CM | POA: Insufficient documentation

## 2017-02-11 DIAGNOSIS — F341 Dysthymic disorder: Secondary | ICD-10-CM | POA: Insufficient documentation

## 2017-02-11 DIAGNOSIS — R440 Auditory hallucinations: Secondary | ICD-10-CM | POA: Diagnosis present

## 2017-02-11 DIAGNOSIS — F250 Schizoaffective disorder, bipolar type: Secondary | ICD-10-CM | POA: Diagnosis present

## 2017-02-11 DIAGNOSIS — F840 Autistic disorder: Secondary | ICD-10-CM | POA: Diagnosis present

## 2017-02-11 DIAGNOSIS — F17210 Nicotine dependence, cigarettes, uncomplicated: Secondary | ICD-10-CM | POA: Diagnosis not present

## 2017-02-11 DIAGNOSIS — F3161 Bipolar disorder, current episode mixed, mild: Secondary | ICD-10-CM | POA: Insufficient documentation

## 2017-02-11 LAB — COMPREHENSIVE METABOLIC PANEL
ALK PHOS: 69 U/L (ref 38–126)
ALT: 18 U/L (ref 17–63)
ANION GAP: 8 (ref 5–15)
AST: 23 U/L (ref 15–41)
Albumin: 4.6 g/dL (ref 3.5–5.0)
BILIRUBIN TOTAL: 0.5 mg/dL (ref 0.3–1.2)
BUN: 14 mg/dL (ref 6–20)
CALCIUM: 9.4 mg/dL (ref 8.9–10.3)
CO2: 28 mmol/L (ref 22–32)
Chloride: 104 mmol/L (ref 101–111)
Creatinine, Ser: 0.74 mg/dL (ref 0.61–1.24)
GFR calc non Af Amer: >60 mL/min (ref >60)
Glucose, Bld: 87 mg/dL (ref 65–99)
POTASSIUM: 3.7 mmol/L (ref 3.5–5.1)
Sodium: 140 mmol/L (ref 135–145)
TOTAL PROTEIN: 8 g/dL (ref 6.5–8.1)

## 2017-02-11 LAB — CBC WITH DIFFERENTIAL/PLATELET
Basophils Absolute: 0 10*3/uL (ref 0.0–0.1)
Basophils Relative: 0 %
Eosinophils Absolute: 0.1 10*3/uL (ref 0.0–0.7)
Eosinophils Relative: 1 %
HCT: 41.2 % (ref 39.0–52.0)
Hemoglobin: 14.2 g/dL (ref 13.0–17.0)
Lymphocytes Relative: 32 %
Lymphs Abs: 2.5 10*3/uL (ref 0.7–4.0)
MCH: 28.5 pg (ref 26.0–34.0)
MCHC: 34.5 g/dL (ref 30.0–36.0)
MCV: 82.7 fL (ref 78.0–100.0)
Monocytes Absolute: 0.5 10*3/uL (ref 0.1–1.0)
Monocytes Relative: 6 %
Neutro Abs: 4.8 10*3/uL (ref 1.7–7.7)
Neutrophils Relative %: 61 %
Platelets: 390 10*3/uL (ref 150–400)
RBC: 4.98 MIL/uL (ref 4.22–5.81)
RDW: 13.3 % (ref 11.5–15.5)
WBC: 7.8 10*3/uL (ref 4.0–10.5)

## 2017-02-11 LAB — RAPID URINE DRUG SCREEN, HOSP PERFORMED
Amphetamines: NOT DETECTED
Barbiturates: NOT DETECTED
Benzodiazepines: NOT DETECTED
COCAINE: NOT DETECTED
OPIATES: NOT DETECTED
Tetrahydrocannabinol: NOT DETECTED

## 2017-02-11 LAB — ETHANOL: Alcohol, Ethyl (B): <5 mg/dL

## 2017-02-11 MED ORDER — HALOPERIDOL DECANOATE 100 MG/ML IM SOLN
50.0000 mg | INTRAMUSCULAR | Status: DC
  Administered 2017-02-11: 50 mg via INTRAMUSCULAR
  Filled 2017-02-11: qty 0.5

## 2017-02-11 MED ORDER — TRIHEXYPHENIDYL HCL 5 MG PO TABS: 5.0000 mg | ORAL_TABLET | Freq: Two times a day (BID) | ORAL | Status: DC

## 2017-02-11 MED ORDER — HALOPERIDOL 1 MG PO TABS
1.0000 mg | ORAL_TABLET | Freq: Two times a day (BID) | ORAL | Status: DC
  Administered 2017-02-11 – 2017-02-12 (×4): 1 mg via ORAL
  Filled 2017-02-11 (×4): qty 1

## 2017-02-11 MED ORDER — TRIHEXYPHENIDYL HCL 5 MG PO TABS
5.0000 mg | ORAL_TABLET | Freq: Two times a day (BID) | ORAL | Status: DC
  Administered 2017-02-11 – 2017-02-12 (×3): 5 mg via ORAL
  Filled 2017-02-11 (×3): qty 1

## 2017-02-11 MED ORDER — HYDROXYZINE HCL 25 MG PO TABS
25.0000 mg | ORAL_TABLET | Freq: Two times a day (BID) | ORAL | Status: DC | PRN
  Administered 2017-02-12: 25 mg via ORAL
  Filled 2017-02-11: qty 1

## 2017-02-11 MED ORDER — DOCUSATE SODIUM 100 MG PO CAPS
100.0000 mg | ORAL_CAPSULE | Freq: Every morning | ORAL | Status: DC
  Administered 2017-02-11 – 2017-02-12 (×2): 100 mg via ORAL
  Filled 2017-02-11 (×2): qty 1

## 2017-02-11 MED ORDER — DIVALPROEX SODIUM 500 MG PO DR TAB
500.0000 mg | DELAYED_RELEASE_TABLET | Freq: Two times a day (BID) | ORAL | Status: DC
  Administered 2017-02-11 – 2017-02-12 (×3): 500 mg via ORAL
  Filled 2017-02-11 (×3): qty 1

## 2017-02-11 NOTE — BH Assessment (Addendum)
Assessment Note  Alexander Fritz is an 26 y.o. male who presents to the ED voluntarily. Pt reports he has been experiencing VH w/o command. Pt vague throughout the assessment. Pt slow to respond and at times speaking incoherently. Pt states he is "seeing things." When asked to describe his hallucinations, pt stares blindly at this Clinical research associate. Pt was asked if he is experiencing any AH as well and pt denies. Pt denies SI and HI at present. Pt has a hx of ED visits c/o similar concerns. Pt has been evaluated by TTS multiple times in the past. Pt was evaluated by TTS on 01/30/17 and at that time was expressing AH w/o visual.   Pt lives in "[address] [place] [facility]" group home and states he "likes it" at the group home. Pt has a hx of I/DD but IQ score is unknown to this Clinical research associate. Pt states he is on probation and when asked why he stated "crime, commit."   Nira Conn, NP recommends AM psych Loura Halt, RN notified of recommendation.   Diagnosis: Autism; I/DD; Schizoaffective D/O  Past Medical History:  Past Medical History:  Diagnosis Date  . Autism   . Bipolar 1 disorder (HCC)     History reviewed. No pertinent surgical history.  Family History: History reviewed. No pertinent family history.  Social History:  reports that he has been smoking Cigarettes.  He has a 2.50 pack-year smoking history. He has quit using smokeless tobacco. He reports that he does not drink alcohol or use drugs.  Additional Social History:  Alcohol / Drug Use Pain Medications: See MAR Prescriptions: SEE MAR Over the Counter: See MAR History of alcohol / drug use?: Yes Substance #1 Name of Substance 1: NICOTINE/CIGARETTES 1 - Age of First Use: 25 1 - Amount (size/oz): 1/2 PACK 1 - Frequency: DAILY 1 - Duration: ONGOING 1 - Last Use / Amount: 01/30/17  CIWA: CIWA-Ar BP: 113/64 Pulse Rate: 66 COWS:    Allergies:  Allergies  Allergen Reactions  . Cefaclor Other (See Comments)    Unknown.  Per group home MAR.  Marland Kitchen  Lithium Palpitations and Rash    Home Medications:  (Not in a hospital admission)  OB/GYN Status:  No LMP for male patient.  General Assessment Data Location of Assessment: [facility] ED TTS Assessment: In system Is this a Tele or Face-to-Face Assessment?: Face-to-Face Is this an Initial Assessment or a Re-assessment for this encounter?: Initial Assessment Marital status: Single Is patient pregnant?: No Pregnancy Status: No Living Arrangements: Group Home Can pt return to current living arrangement?: Yes Admission Status: Voluntary Is patient capable of signing voluntary admission?: Yes Referral Source: Self/Family/Friend Insurance type: Medicare     Crisis Care Plan Living Arrangements: Group Home Legal Guardian: Other: [name], [phone]) Name of Psychiatrist: Unknown Name of Therapist: Unknown  Education Status Is patient currently in school?: No Highest grade of school patient has completed: 10th  Risk to self with the past 6 months Suicidal Ideation: No Has patient been a risk to self within the past 6 months prior to admission? : No Suicidal Intent: No Has patient had any suicidal intent within the past 6 months prior to admission? : No Is patient at risk for suicide?: No Suicidal Plan?: No Has patient had any suicidal plan within the past 6 months prior to admission? : No Access to Means: No What has been your use of drugs/alcohol within the last 12 months?: denies  Previous Attempts/Gestures: No Triggers for Past Attempts: None known  Intentional Self Injurious Behavior: None Family Suicide History: No Recent stressful life event(s): Other (Comment) (increased VH) Persecutory voices/beliefs?: No Depression: No Substance abuse history and/or treatment for substance abuse?: No Suicide prevention information given to non-admitted patients: Not applicable  Risk to Others within the past 6 months Homicidal Ideation: No Does patient have any lifetime  risk of violence toward others beyond the six months prior to admission? : Yes (comment) (pt has hx of attacking ED staff) Thoughts of Harm to Others: No-Not Currently Present/Within Last 6 Months Current Homicidal Intent: No Current Homicidal Plan: No Access to Homicidal Means: No History of harm to others?: Yes Assessment of Violence: On admission Violent Behavior Description: per chart, pt has hx of attacking ED staff  Does patient have access to weapons?: No Criminal Charges Pending?: No Does patient have a court date: No Is patient on probation?: Yes  Psychosis Hallucinations: Visual Delusions: None noted  Mental Status Report Appearance/Hygiene: In scrubs Eye Contact: Good Motor Activity: Freedom of movement Speech: Incoherent, Slow, Soft Level of Consciousness: Quiet/awake Mood: Other (Comment), Apathetic Affect: Flat, Apathetic Anxiety Level: None Thought Processes: Thought Blocking Judgement: Partial Orientation: Person, Place, Time, Situation Obsessive Compulsive Thoughts/Behaviors: None  Cognitive Functioning Concentration: Fair Memory: Recent Intact, Remote Impaired IQ: Below Average Level of Function: unknown Insight: Fair Impulse Control: Fair Appetite: Good Sleep: No Change Total Hours of Sleep: 8 Vegetative Symptoms: None  ADLScreening [facility] Assessment Services) Patient's cognitive ability adequate to safely complete daily activities?: Yes Patient able to express need for assistance with ADLs?: Yes Independently performs ADLs?: Yes (appropriate for developmental age)  Prior Inpatient Therapy Prior Inpatient Therapy: Yes Prior Therapy Dates: unknown Prior Therapy Facilty/Provider(s): pt unable to recall Reason for Treatment: ASD, Schizoaffective, Bipolar  Prior Outpatient Therapy Prior Outpatient Therapy: Yes Prior Therapy Dates: current Prior Therapy Facilty/Provider(s): pt inaudible Reason for Treatment: unknown Does patient have an ACCT  team?: Unknown Does patient have Intensive In-House Services?  : Unknown Does patient have [facility] services? : Unknown Does patient have P4CC services?: Unknown  ADL Screening (condition at time of admission) Patient's cognitive ability adequate to safely complete daily activities?: Yes Is the patient deaf or have difficulty hearing?: No Does the patient have difficulty seeing, even when wearing glasses/contacts?: No Does the patient have difficulty concentrating, remembering, or making decisions?: Yes Patient able to express need for assistance with ADLs?: Yes Does the patient have difficulty dressing or bathing?: No Independently performs ADLs?: Yes (appropriate for developmental age) Does the patient have difficulty walking or climbing stairs?: No Weakness of Legs: None Weakness of Arms/Hands: None  Home Assistive Devices/Equipment Home Assistive Devices/Equipment: None    Abuse/Neglect Assessment (Assessment to be complete while patient is alone) Physical Abuse: Denies Verbal Abuse: Denies Sexual Abuse: Denies Exploitation of patient/patient's resources: Denies Self-Neglect: Denies     Merchant navy officer (For Healthcare) Does Patient Have a Medical Advance Directive?: No Would patient like information on creating a medical advance directive?: No - Patient declined    Additional Information 1:1 In Past 12 Months?: Yes CIRT Risk: Yes Elopement Risk: Yes Does patient have medical clearance?: Yes     Disposition:  Disposition Initial Assessment Completed for this Encounter: Yes Disposition of Patient: Other dispositions Other disposition(s): Other (Comment) (AM psych eval per Nira Conn, NP)  On Site Evaluation by:   Reviewed with Physician:    Karolee Ohs 02/11/2017 5:30 AM

## 2017-02-11 NOTE — ED Notes (Signed)
Patient given sandwich and soda upon request. Patient calm and cooperative at this time.

## 2017-02-11 NOTE — ED Provider Notes (Signed)
[facility]-EMERGENCY DEPT Provider Note   CSN: [id number] Arrival date & time: 02/11/17  0043     History   Chief Complaint Chief Complaint  Patient presents with  . Medical Clearance    Hearing Voices    HPI Alexander Fritz is a 26 y.o. male.  The history is provided by the patient.  Mental Health Problem  Presenting symptoms: hallucinations   Presenting symptoms: no aggressive behavior, no self-mutilation and no suicidal thoughts   Degree of incapacity (severity):  Mild Onset quality:  Unable to specify Timing:  Constant Progression:  Unchanged Chronicity:  New Context: not alcohol use   Treatment compliance:  Untreated Relieved by:  Nothing Worsened by:  Nothing Ineffective treatments:  None tried Associated symptoms: no abdominal pain and no anhedonia   Risk factors: hx of mental illness     Past Medical History:  Diagnosis Date  . Autism   . Bipolar 1 disorder [facility])     Patient Active Problem List   Diagnosis Date Noted  . Auditory hallucinations 01/31/2017  . 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 Bipolar affective disorder, current episode mild (HCC) 10/05/2016  . Autism spectrum disorder 08/26/2016    History reviewed. No pertinent surgical history.     Home Medications    Prior to Admission medications   Medication Sig Start Date End Date Taking? Authorizing Provider  cetirizine (ZYRTEC) 10 MG tablet Take 10 mg by mouth daily. (0800)   Yes [provider]  divalproex (DEPAKOTE) 500 MG DR tablet Take 1 tablet (500 mg total) by mouth 2 (two) times daily. 11/25/16  Yes Charlynne Pander, MD  docusate sodium (COLACE) 100 MG capsule Take 100 mg by mouth every morning.    Yes [provider]  haloperidol decanoate (HALDOL DECANOATE) 100 MG/ML injection Inject 0.5 mLs (50 mg total) into the muscle every 30 (thirty) days. 12/23/16  Yes Charm Rings, NP  hydrOXYzine (ATARAX/VISTARIL) 25 MG tablet Take 1  tablet (25 mg total) by mouth 2 (two) times daily as needed for anxiety (agitation). 11/23/16  Yes Charm Rings, NP  trihexyphenidyl (ARTANE) 5 MG tablet Take 1 tablet (5 mg total) by mouth 2 (two) times daily with breakfast and lunch. (0800 & 2000) 11/23/16  Yes Charm Rings, NP  carbamazepine (TEGRETOL) 200 MG tablet Take 1 tablet (200 mg total) by mouth 2 (two) times daily. (0800 & 2000) Patient not taking: Reported on 10/04/2016 09/18/16   Charm Rings, NP  haloperidol (HALDOL) 1 MG tablet Take 1 tablet (1 mg total) by mouth 2 (two) times daily. Patient not taking: Reported on 02/11/2017 11/23/16   Charm Rings, NP  risperiDONE (RISPERDAL) 2 MG tablet Take 1 tablet (2 mg total) by mouth 2 (two) times daily. Patient not taking: Reported on 10/04/2016 09/18/16   Charm Rings, NP    Family History History reviewed. No pertinent family history.  Social History Social History  Substance Use Topics  . Smoking status: Current Every Day Smoker    Packs/day: 0.50    Years: 5.00    Types: Cigarettes  . Smokeless tobacco: Former Neurosurgeon  . Alcohol use No     Allergies   Cefaclor and Lithium   Review of Systems Review of Systems  Gastrointestinal: Negative for abdominal pain.  Psychiatric/Behavioral: Positive for hallucinations. Negative for self-injury, sleep disturbance and suicidal ideas.  All other systems reviewed and are negative.    Physical Exam Updated Vital Signs  BP 113/64 (BP Location: Left Arm)   Pulse 66   Temp 98.2 F (36.8 C) (Oral)   Resp 18   Ht  (1.727 m)   Wt 58.1 kg (128 lb)   SpO2 100%   BMI 19.46 kg/m   Physical Exam  Constitutional: He is oriented to person, place, and time. He appears well-developed and well-nourished. No distress.  HENT:  Head: Normocephalic and atraumatic.  Mouth/Throat: No oropharyngeal exudate.  Eyes: Pupils are equal, round, and reactive to light. Conjunctivae are normal.  Neck: Normal range of motion. Neck supple.    Cardiovascular: Normal rate, regular rhythm, normal heart sounds and intact distal pulses.   Pulmonary/Chest: Effort normal and breath sounds normal. He has no wheezes. He has no rales.  Abdominal: Soft. Bowel sounds are normal. He exhibits no mass. There is no tenderness. There is no rebound and no guarding.  Musculoskeletal: Normal range of motion.  Neurological: He is alert and oriented to person, place, and time. He displays normal reflexes.  Skin: Skin is warm and dry. Capillary refill takes less than 2 seconds.  Psychiatric: He has a normal mood and affect.     ED Treatments / Results  Labs (all labs ordered are listed, but only abnormal results are displayed)  Results for orders placed or performed during the hospital encounter of 02/11/17  CBC with Differential/Platelet  Result Value Ref Range   WBC 7.8 4.0 - 10.5 K/uL   RBC 4.98 4.22 - 5.81 MIL/uL   Hemoglobin 14.2 13.0 - 17.0 g/dL   HCT 16.1 09.6 - 04.5 %   MCV 82.7 78.0 - 100.0 fL   MCH 28.5 26.0 - 34.0 pg   MCHC 34.5 30.0 - 36.0 g/dL   RDW 40.9 81.1 - 91.4 %   Platelets 390 150 - 400 K/uL   Neutrophils Relative % 61 %   Neutro Abs 4.8 1.7 - 7.7 K/uL   Lymphocytes Relative 32 %   Lymphs Abs 2.5 0.7 - 4.0 K/uL   Monocytes Relative 6 %   Monocytes Absolute 0.5 0.1 - 1.0 K/uL   Eosinophils Relative 1 %   Eosinophils Absolute 0.1 0.0 - 0.7 K/uL   Basophils Relative 0 %   Basophils Absolute 0.0 0.0 - 0.1 K/uL  Comprehensive metabolic panel  Result Value Ref Range   Sodium 140 135 - 145 mmol/L   Potassium 3.7 3.5 - 5.1 mmol/L   Chloride 104 101 - 111 mmol/L   CO2 28 22 - 32 mmol/L   Glucose, Bld 87 65 - 99 mg/dL   BUN 14 6 - 20 mg/dL   Creatinine, Ser 7.82 0.61 - 1.24 mg/dL   Calcium 9.4 8.9 - 95.6 mg/dL   Total Protein 8.0 6.5 - 8.1 g/dL   Albumin 4.6 3.5 - 5.0 g/dL   AST 23 15 - 41 U/L   ALT 18 17 - 63 U/L   Alkaline Phosphatase 69 38 - 126 U/L   Total Bilirubin 0.5 0.3 - 1.2 mg/dL   GFR calc non Af Amer  >60 >60 mL/min   GFR calc Af Amer >60 >60 mL/min   Anion gap 8 5 - 15  Ethanol  Result Value Ref Range   Alcohol, Ethyl (B) <5 <5 mg/dL  Rapid urine drug screen (hospital performed)  Result Value Ref Range   Opiates NONE DETECTED NONE DETECTED   Cocaine NONE DETECTED NONE DETECTED   Benzodiazepines NONE DETECTED NONE DETECTED   Amphetamines NONE DETECTED NONE DETECTED  Tetrahydrocannabinol NONE DETECTED NONE DETECTED   Barbiturates NONE DETECTED NONE DETECTED   No results found.  Radiology No results found.  Procedures Procedures (including critical care time)  Medications Ordered in ED Medications  haloperidol (HALDOL) tablet 1 mg (not administered)  docusate sodium (COLACE) capsule 100 mg (not administered)      Final Clinical Impressions(s) / ED Diagnoses   Medically clear for psychiatry   Starlyn Droge, MD 02/11/17 [phone]

## 2017-02-11 NOTE — ED Triage Notes (Signed)
Patient states "I have been hearing and seeing things for a few days now". Patient states he has been taking medications as ordered. Patient denies any thoughts of suicide or homicide at this time. Pt denies any self-harm actions prior to arrival and he currently verbally contracts for safety. No distress noted.

## 2017-02-12 DIAGNOSIS — F17210 Nicotine dependence, cigarettes, uncomplicated: Secondary | ICD-10-CM

## 2017-02-12 DIAGNOSIS — R440 Auditory hallucinations: Secondary | ICD-10-CM

## 2017-02-12 DIAGNOSIS — F3161 Bipolar disorder, current episode mixed, mild: Secondary | ICD-10-CM | POA: Diagnosis not present

## 2017-02-12 NOTE — Progress Notes (Signed)
CSW provided patient with number for Ladene Artist with [facility]. Gregary Signs with [facility] with arrive to pick patient up around 2PM.   Stacy Gardner, [facility] Emergency Room Clinical Social Worker [phone]
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
## 2017-02-12 NOTE — [facility] Counselor (Signed)
Per Dr. Jannifer Franklin and Nanine Means, DNP, patient meet criteria for discharge

## 2017-02-13 NOTE — [facility] Suicide Risk Assessment (Signed)
Suicide Risk Assessment  Discharge Assessment   [facility] Discharge Suicide Risk Assessment   Principal Problem: Bipolar affective disorder, current episode mild [facility]) Discharge Diagnoses:  Patient Active Problem List   Diagnosis Date Noted  . Adjustment disorder with mixed disturbance of emotions and conduct [F43.25] 11/23/2016    Priority: High  . Bipolar affective disorder, current episode mild ([facility]) [F31.9] 10/05/2016    Priority: High  . Autism spectrum disorder [F84.0] 08/26/2016    Priority: High  . Auditory hallucinations [R44.0] 01/31/2017    Total Time spent with patient: 45 minutes   Musculoskeletal: Strength & Muscle Tone: within normal limits Gait & Station: normal Patient leans: N/A  Psychiatric Specialty Exam: Physical Exam  Constitutional: He is oriented to person, place, and time. He appears well-developed and well-nourished.  HENT:  Head: Normocephalic.  Neck: Normal range of motion.  Respiratory: Effort normal.  Musculoskeletal: Normal range of motion.  Neurological: He is alert and oriented to person, place, and time.  Psychiatric: He has a normal mood and affect. His speech is normal and behavior is normal. Judgment and thought content normal. Cognition and memory are normal.    Review of Systems  All other systems reviewed and are negative.   Blood pressure (!) 108/58, pulse 73, temperature 98 F (36.7 C), temperature source Oral, resp. rate 16, height  (1.727 m), weight 58.1 kg (128 lb), SpO2 100 %.Body mass index is 19.46 kg/m.  General Appearance: Casual  Eye Contact:  Good  Speech:  Normal Rate  Volume:  Normal  Mood:  Euthymic  Affect:  Congruent  Thought Process:  Coherent and Descriptions of Associations: Intact  Orientation:  Full (Time, Place, and Person)  Thought Content:  WDL and Logical  Suicidal Thoughts:  No  Homicidal Thoughts:  No  Memory:  Immediate;   Good Recent;   Good Remote;   Good  Judgement:  Fair  Insight:  Fair   Psychomotor Activity:  Normal  Concentration:  Concentration: Good and Attention Span: Good  Recall:  Good  Fund of Knowledge:  Fair  Language:  Good  Akathisia:  No  Handed:  Right  AIMS (if indicated):     Assets:  Housing Leisure Time Physical Health Resilience Social Support  ADL's:  Intact  Cognition:  WNL  Sleep:       Mental Status Per Nursing Assessment::   On Admission:   hallucinations  Demographic Factors:  Male and Caucasian  Loss Factors: NA  Historical Factors: NA  Risk Reduction Factors:   Sense of responsibility to family, Living with another person, especially a relative, Positive social support and Positive therapeutic relationship  None  Cognitive Features That Contribute To Risk:  None    Suicide Risk:  Minimal: No identifiable suicidal ideation.  Patients presenting with no risk factors but with morbid ruminations; may be classified as minimal risk based on the severity of the depressive symptoms    Plan Of Care/Follow-up recommendations:  Activity:  as tolerated Diet:  heart healthy diet  LORD, JAMISON, NP 02/13/2017, 11:35 AM

## 2017-02-14 ENCOUNTER — Encounter (HOSPITAL_COMMUNITY): Payer: Self-pay | Admitting: *Deleted

## 2017-02-14 ENCOUNTER — Emergency Department (HOSPITAL_COMMUNITY)
Admission: EM | Admit: 2017-02-14 | Discharge: 2017-02-14 | Disposition: A | Discharge status: Home / Self Care | Payer: Medicare Other | Source: Home / Self Care | Attending: Emergency Medicine | Admitting: Emergency Medicine

## 2017-02-14 DIAGNOSIS — Z79899 Other long term (current) drug therapy: Secondary | ICD-10-CM | POA: Insufficient documentation

## 2017-02-14 DIAGNOSIS — F4324 Adjustment disorder with disturbance of conduct: Secondary | ICD-10-CM | POA: Diagnosis present

## 2017-02-14 DIAGNOSIS — F250 Schizoaffective disorder, bipolar type: Secondary | ICD-10-CM | POA: Diagnosis present

## 2017-02-14 DIAGNOSIS — F840 Autistic disorder: Secondary | ICD-10-CM | POA: Insufficient documentation

## 2017-02-14 DIAGNOSIS — R440 Auditory hallucinations: Secondary | ICD-10-CM | POA: Diagnosis present

## 2017-02-14 DIAGNOSIS — F17210 Nicotine dependence, cigarettes, uncomplicated: Secondary | ICD-10-CM | POA: Insufficient documentation

## 2017-02-14 DIAGNOSIS — F4325 Adjustment disorder with mixed disturbance of emotions and conduct: Secondary | ICD-10-CM | POA: Insufficient documentation

## 2017-02-14 LAB — URINALYSIS, ROUTINE W REFLEX MICROSCOPIC
BILIRUBIN URINE: NEGATIVE
Bacteria, UA: NONE SEEN
Glucose, UA: NEGATIVE mg/dL
HGB URINE DIPSTICK: NEGATIVE
Ketones, ur: 5 mg/dL — AB
Leukocytes, UA: NEGATIVE
Nitrite: NEGATIVE
PROTEIN: 100 mg/dL — AB
SPECIFIC GRAVITY, URINE: 1.019 (ref 1.005–1.030)
SQUAMOUS EPITHELIAL / LPF: NONE SEEN
pH: 6 (ref 5.0–8.0)

## 2017-02-14 LAB — CBC WITH DIFFERENTIAL/PLATELET
Basophils Absolute: 0 10*3/uL (ref 0.0–0.1)
Basophils Relative: 0 %
EOS ABS: 0.1 10*3/uL (ref 0.0–0.7)
EOS PCT: 1 %
HCT: 38.3 % — ABNORMAL LOW (ref 39.0–52.0)
Hemoglobin: 12.9 g/dL — ABNORMAL LOW (ref 13.0–17.0)
Lymphocytes Relative: 25 %
Lymphs Abs: 2.5 10*3/uL (ref 0.7–4.0)
MCH: 28.4 pg (ref 26.0–34.0)
MCHC: 33.7 g/dL (ref 30.0–36.0)
MCV: 84.2 fL (ref 78.0–100.0)
MONO ABS: 0.7 10*3/uL (ref 0.1–1.0)
MONOS PCT: 7 %
NEUTROS ABS: 6.7 10*3/uL (ref 1.7–7.7)
NEUTROS PCT: 67 %
PLATELETS: 295 10*3/uL (ref 150–400)
RBC: 4.55 MIL/uL (ref 4.22–5.81)
RDW: 13.4 % (ref 11.5–15.5)
WBC: 10.1 10*3/uL (ref 4.0–10.5)

## 2017-02-14 LAB — RAPID URINE DRUG SCREEN, HOSP PERFORMED
AMPHETAMINES: NOT DETECTED
Barbiturates: NOT DETECTED
Benzodiazepines: NOT DETECTED
COCAINE: NOT DETECTED
OPIATES: NOT DETECTED
TETRAHYDROCANNABINOL: NOT DETECTED

## 2017-02-14 LAB — BASIC METABOLIC PANEL
ANION GAP: 8 (ref 5–15)
BUN: 13 mg/dL (ref 6–20)
CALCIUM: 9.2 mg/dL (ref 8.9–10.3)
CO2: 26 mmol/L (ref 22–32)
CREATININE: 0.7 mg/dL (ref 0.61–1.24)
Chloride: 106 mmol/L (ref 101–111)
Glucose, Bld: 90 mg/dL (ref 65–99)
Potassium: 4 mmol/L (ref 3.5–5.1)
SODIUM: 140 mmol/L (ref 135–145)

## 2017-02-14 LAB — ETHANOL: Alcohol, Ethyl (B): <10 mg/dL (ref <10)

## 2017-02-14 NOTE — ED Triage Notes (Signed)
Pt stated "the police brought me in.  I was seeing things.  I ran away from the Group Home."

## 2017-02-14 NOTE — [facility] Counselor (Signed)
Clinician attempted to contact pt's legal guardian [name] [phone]). Clinician left a HIPPA compliant voice message.   Redmond Pulling, MS, [facility], [facility] Triage Specialist [phone] ]
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
## 2017-02-14 NOTE — BHH Counselor (Signed)
Disposition discussed with Elaine, RN.    Prince Olivier D Tempestt Silba, MS, LPC, [facility] Triage Specialist [phone]  

## 2017-02-14 NOTE — Progress Notes (Signed)
CSW spoke with Ladene Artist from [facility] of Care at [phone] and Gregary Signs will be at [facility] around 2PM to pick patient up.   CSW spoke with patients legal guardian [name] [phone] that patient had returned to [facility] ED and was being discharged back to group home.   CSW notified RN that group home would arrive aound 2PM.   Stacy Gardner, [facility] Emergency Room Clinical Social Worker [phone]

## 2017-02-14 NOTE — ED Notes (Signed)
Per Dr. Judd Lien, hold off on labs for the moment.

## 2017-02-14 NOTE — BHH Suicide Risk Assessment (Signed)
Suicide Risk Assessment  Discharge Assessment   [facility] Discharge Suicide Risk Assessment   Principal Problem: Adjustment disorder with mixed disturbance of emotions and conduct Discharge Diagnoses:  Patient Active Problem List   Diagnosis Date Noted  . Adjustment disorder with mixed disturbance of emotions and conduct [F43.25] 11/23/2016    Priority: High  . Bipolar affective disorder, current episode mild (HCC) [F31.9] 10/05/2016    Priority: High  . Autism spectrum disorder [F84.0] 08/26/2016    Priority: High  . Auditory hallucinations [R44.0] 01/31/2017    Total Time spent with patient: 45 minutes  Musculoskeletal: Strength & Muscle Tone: within normal limits Gait & Station: normal Patient leans: N/A  Psychiatric Specialty Exam:   Blood pressure (!) 102/52, pulse (!) 59, temperature (!) 97.4 F (36.3 C), temperature source Oral, resp. rate 18, height  (1.727 m), weight 58.1 kg (128 lb), SpO2 97 %.Body mass index is 19.46 kg/m.  General Appearance: Casual  Eye Contact::  Good  Speech:  Normal Rate409  Volume:  Normal  Mood:  Euthymic  Affect:  Blunt  Thought Process:  Coherent and Descriptions of Associations: Intact  Orientation:  Full (Time, Place, and Person)  Thought Content:  WDL and Logical  Suicidal Thoughts:  No  Homicidal Thoughts:  No  Memory:  Immediate;   Good Recent;   Good Remote;   Good  Judgement:  Fair  Insight:  Fair  Psychomotor Activity:  Normal  Concentration:  Good  Recall:  Good  Fund of Knowledge:Fair  Language: Good  Akathisia:  No  Handed:  Right  AIMS (if indicated):     Assets:  Housing Leisure Time Physical Health Resilience Social Support  Sleep:     Cognition: WNL  ADL's:  Intact   Mental Status Per Nursing Assessment::   On Admission:   Ran away from his group home which he does often despite saying he likes his group home.  No suicidal/homicidal ideations or substance abuse issues.  He reports having hallucinations  but vague description that does not fit visual hallucinations, not responding to internal stimuli on assessment.  Alexander Fritz is well known to these providers.  Stable for discharge.  Demographic Factors:  Male, Adolescent or young adult and Caucasian  Loss Factors: NA  Historical Factors: NA  Risk Reduction Factors:   Living with another person, especially a relative, Positive social support and Positive therapeutic relationship  Continued Clinical Symptoms:  None  Cognitive Features That Contribute To Risk:  None    Suicide Risk:  Minimal: No identifiable suicidal ideation.  Patients presenting with no risk factors but with morbid ruminations; may be classified as minimal risk based on the severity of the depressive symptoms    Plan Of Care/Follow-up recommendations:  Activity:  as tolerated Diet:  heart healthy diet  Alexander Hoh, NP 02/14/2017, 10:23 AM

## 2017-02-14 NOTE — ED Notes (Signed)
Bed: ZO10 Expected date:  Expected time:  Means of arrival:  Comments: EVS requested

## 2017-02-15 ENCOUNTER — Encounter (HOSPITAL_COMMUNITY): Payer: Self-pay | Admitting: *Deleted

## 2017-02-15 ENCOUNTER — Emergency Department (HOSPITAL_COMMUNITY)
Admission: EM | Admit: 2017-02-15 | Discharge: 2017-02-15 | Disposition: A | Discharge status: Home / Self Care | Payer: Medicare Other | Attending: Emergency Medicine | Admitting: Emergency Medicine

## 2017-02-15 DIAGNOSIS — F4324 Adjustment disorder with disturbance of conduct: Secondary | ICD-10-CM | POA: Diagnosis present

## 2017-02-15 DIAGNOSIS — F4325 Adjustment disorder with mixed disturbance of emotions and conduct: Secondary | ICD-10-CM

## 2017-02-15 LAB — CBC WITH DIFFERENTIAL/PLATELET
Basophils Absolute: 0 10*3/uL (ref 0.0–0.1)
Basophils Relative: 0 %
EOS PCT: 1 %
Eosinophils Absolute: 0.1 10*3/uL (ref 0.0–0.7)
HEMATOCRIT: 37.8 % — AB (ref 39.0–52.0)
Hemoglobin: 12.8 g/dL — ABNORMAL LOW (ref 13.0–17.0)
LYMPHS PCT: 31 %
Lymphs Abs: 2.8 10*3/uL (ref 0.7–4.0)
MCH: 28.6 pg (ref 26.0–34.0)
MCHC: 33.9 g/dL (ref 30.0–36.0)
MCV: 84.4 fL (ref 78.0–100.0)
MONO ABS: 0.6 10*3/uL (ref 0.1–1.0)
MONOS PCT: 6 %
NEUTROS ABS: 5.3 10*3/uL (ref 1.7–7.7)
Neutrophils Relative %: 62 %
Platelets: 258 10*3/uL (ref 150–400)
RBC: 4.48 MIL/uL (ref 4.22–5.81)
RDW: 13.3 % (ref 11.5–15.5)
WBC: 8.8 10*3/uL (ref 4.0–10.5)

## 2017-02-15 LAB — COMPREHENSIVE METABOLIC PANEL
ALBUMIN: 4.1 g/dL (ref 3.5–5.0)
ALK PHOS: 70 U/L (ref 38–126)
ALT: 13 U/L — AB (ref 17–63)
AST: 15 U/L (ref 15–41)
Anion gap: 10 (ref 5–15)
BUN: 11 mg/dL (ref 6–20)
CALCIUM: 9.1 mg/dL (ref 8.9–10.3)
CO2: 23 mmol/L (ref 22–32)
CREATININE: 0.61 mg/dL (ref 0.61–1.24)
Chloride: 103 mmol/L (ref 101–111)
GFR calc Af Amer: >60 mL/min (ref >60)
GFR calc non Af Amer: >60 mL/min (ref >60)
Glucose, Bld: 87 mg/dL (ref 65–99)
Potassium: 4 mmol/L (ref 3.5–5.1)
SODIUM: 136 mmol/L (ref 135–145)
Total Bilirubin: <0.1 mg/dL — ABNORMAL LOW (ref 0.3–1.2)
Total Protein: 6.8 g/dL (ref 6.5–8.1)

## 2017-02-15 LAB — RAPID URINE DRUG SCREEN, HOSP PERFORMED
Amphetamines: NOT DETECTED
BARBITURATES: NOT DETECTED
Benzodiazepines: NOT DETECTED
Cocaine: NOT DETECTED
Opiates: NOT DETECTED
TETRAHYDROCANNABINOL: NOT DETECTED

## 2017-02-15 LAB — SALICYLATE LEVEL: Salicylate Lvl: <7 mg/dL (ref 2.8–30.0)

## 2017-02-15 LAB — VALPROIC ACID LEVEL: VALPROIC ACID LVL: 94 ug/mL (ref 50.0–100.0)

## 2017-02-15 LAB — ETHANOL: Alcohol, Ethyl (B): <10 mg/dL (ref <10)

## 2017-02-15 LAB — ACETAMINOPHEN LEVEL

## 2017-02-15 LAB — CARBAMAZEPINE LEVEL, TOTAL: Carbamazepine Lvl: <2 ug/mL — ABNORMAL LOW (ref 4.0–12.0)

## 2017-02-15 MED ORDER — DOCUSATE SODIUM 100 MG PO CAPS
100.0000 mg | ORAL_CAPSULE | Freq: Every morning | ORAL | Status: DC
  Administered 2017-02-15: 100 mg via ORAL
  Filled 2017-02-15: qty 1

## 2017-02-15 MED ORDER — DIVALPROEX SODIUM 500 MG PO DR TAB
500.0000 mg | DELAYED_RELEASE_TABLET | Freq: Two times a day (BID) | ORAL | Status: DC
  Administered 2017-02-15: 500 mg via ORAL
  Filled 2017-02-15: qty 1

## 2017-02-15 MED ORDER — ONDANSETRON HCL 4 MG PO TABS: 4.0000 mg | ORAL_TABLET | Freq: Three times a day (TID) | ORAL | Status: DC | PRN

## 2017-02-15 MED ORDER — ZOLPIDEM TARTRATE 5 MG PO TABS: 5.0000 mg | ORAL_TABLET | Freq: Every evening | ORAL | Status: DC | PRN

## 2017-02-15 MED ORDER — ACETAMINOPHEN 325 MG PO TABS: 650.0000 mg | ORAL_TABLET | ORAL | Status: DC | PRN

## 2017-02-15 MED ORDER — CARBAMAZEPINE 200 MG PO TABS
200.0000 mg | ORAL_TABLET | Freq: Two times a day (BID) | ORAL | Status: DC
  Administered 2017-02-15: 200 mg via ORAL
  Filled 2017-02-15: qty 1

## 2017-02-15 MED ORDER — NICOTINE 7 MG/24HR TD PT24: 7.0000 mg | MEDICATED_PATCH | Freq: Every day | TRANSDERMAL | Status: DC

## 2017-02-15 MED ORDER — TRIHEXYPHENIDYL HCL 5 MG PO TABS
5.0000 mg | ORAL_TABLET | Freq: Two times a day (BID) | ORAL | Status: DC
  Administered 2017-02-15: 5 mg via ORAL
  Filled 2017-02-15: qty 1

## 2017-02-15 MED ORDER — ALUM & MAG HYDROXIDE-SIMETH 200-200-20 MG/5ML PO SUSP: 30.0000 mL | Freq: Four times a day (QID) | ORAL | Status: DC | PRN

## 2017-02-15 MED ORDER — HYDROXYZINE HCL 25 MG PO TABS: 25.0000 mg | ORAL_TABLET | Freq: Two times a day (BID) | ORAL | Status: DC | PRN

## 2017-02-15 NOTE — ED Provider Notes (Signed)
[facility]-EMERGENCY DEPT Provider Note   CSN: [id number] Arrival date & time: 02/14/17  2340     History   Chief Complaint Chief Complaint  Patient presents with  . Hallucinations    HPI Noah Lembke is a 26 y.o. male.  The history is provided by the patient. The history is limited by the condition of the patient (Psychiatric disorder, autism).  He was transferred here from his group home because of hallucinations. He tells me that he is having auditory hallucinations, but will not tell mewhat he is hearing or when the hallucinations began. He denies visual hallucinations. He denies homicidal and suicidal ideation.  Past Medical History:  Diagnosis Date  . Autism   . Bipolar 1 disorder [facility])     Patient Active Problem List   Diagnosis Date Noted  . Auditory hallucinations 01/31/2017  . 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 Bipolar affective disorder, current episode mild (HCC) 10/05/2016  . Autism spectrum disorder 08/26/2016    History reviewed. No pertinent surgical history.     Home Medications    Prior to Admission medications   Medication Sig Start Date End Date Taking? Authorizing Provider  carbamazepine (TEGRETOL) 200 MG tablet Take 1 tablet (200 mg total) by mouth 2 (two) times daily. (0800 & 2000) Patient not taking: Reported on 10/04/2016 09/18/16   Charm Rings, NP  cetirizine (ZYRTEC) 10 MG tablet Take 10 mg by mouth daily. (0800)    [provider]  divalproex (DEPAKOTE) 500 MG DR tablet Take 1 tablet (500 mg total) by mouth 2 (two) times daily. 11/25/16   Charlynne Pander, MD  docusate sodium (COLACE) 100 MG capsule Take 100 mg by mouth every morning.     [provider]  haloperidol (HALDOL) 1 MG tablet Take 1 tablet (1 mg total) by mouth 2 (two) times daily. Patient not taking: Reported on 02/11/2017 11/23/16   Charm Rings, NP  haloperidol decanoate (HALDOL DECANOATE) 100 MG/ML injection Inject  0.5 mLs (50 mg total) into the muscle every 30 (thirty) days. 12/23/16   Charm Rings, NP  hydrOXYzine (ATARAX/VISTARIL) 25 MG tablet Take 1 tablet (25 mg total) by mouth 2 (two) times daily as needed for anxiety (agitation). 11/23/16   Charm Rings, NP  risperiDONE (RISPERDAL) 2 MG tablet Take 1 tablet (2 mg total) by mouth 2 (two) times daily. Patient not taking: Reported on 10/04/2016 09/18/16   Charm Rings, NP  trihexyphenidyl (ARTANE) 5 MG tablet Take 1 tablet (5 mg total) by mouth 2 (two) times daily with breakfast and lunch. (0800 & 2000) 11/23/16   Charm Rings, NP    Family History No family history on file.  Social History Social History  Substance Use Topics  . Smoking status: Current Every Day Smoker    Packs/day: 0.50    Years: 5.00    Types: Cigarettes  . Smokeless tobacco: Former Neurosurgeon  . Alcohol use No     Allergies   Cefaclor and Lithium   Review of Systems Review of Systems  Unable to perform ROS: Psychiatric disorder     Physical Exam Updated Vital Signs BP (!) 117/56 (BP Location: Left Arm)   Pulse 74   Temp 98.3 F (36.8 C) (Oral)   Resp 18   Ht  (1.727 m)   Wt 58.1 kg (128 lb)   SpO2 100%   BMI 19.46 kg/m   Physical Exam  Nursing note and vitals  reviewed.  26 year old male, resting comfortably and in no acute distress. Vital signs are normal. Oxygen saturation is 100%, which is normal. Head is normocephalic and atraumatic. PERRLA, EOMI. Oropharynx is clear. Neck is nontender and supple without adenopathy or JVD. Back is nontender and there is no CVA tenderness. Lungs are clear without rales, wheezes, or rhonchi. Chest is nontender. Heart has regular rate and rhythm without murmur. Abdomen is soft, flat, nontender without masses or hepatosplenomegaly and peristalsis is normoactive. Extremities have no cyanosis or edema, full range of motion is present. Skin is warm and dry without rash. Neurologic: Awake and alert, speech normal,  cranial nerves are intact, there are no motor or sensory deficits. Psychiatric: Flat affect, gives to terse answers to questions.does not appear to be responding to internal stimuli.  ED Treatments / Results  Labs (all labs ordered are listed, but only abnormal results are displayed) Labs Reviewed  COMPREHENSIVE METABOLIC PANEL - Abnormal; Notable for the following:       Result Value   ALT 13 (*)    Total Bilirubin <0.1 (*)    All other components within normal limits  ACETAMINOPHEN LEVEL - Abnormal; Notable for the following:    Acetaminophen (Tylenol), Serum <10 (*)    All other components within normal limits  CARBAMAZEPINE LEVEL, TOTAL - Abnormal; Notable for the following:    Carbamazepine Lvl <2.0 (*)    All other components within normal limits  CBC WITH DIFFERENTIAL/PLATELET - Abnormal; Notable for the following:    Hemoglobin 12.8 (*)    HCT 37.8 (*)    All other components within normal limits  ETHANOL  RAPID URINE DRUG SCREEN, HOSP PERFORMED  SALICYLATE LEVEL  VALPROIC ACID LEVEL  CBC WITH DIFFERENTIAL/PLATELET    Procedures Procedures (including critical care time)  Medications Ordered in ED Medications  nicotine (NICODERM CQ - dosed in mg/24 hr) patch 7 mg (not administered)  alum & mag hydroxide-simeth (MAALOX/MYLANTA) 200-200-20 MG/5ML suspension 30 mL (not administered)  zolpidem (AMBIEN) tablet 5 mg (not administered)  ondansetron (ZOFRAN) tablet 4 mg (not administered)  acetaminophen (TYLENOL) tablet 650 mg (not administered)  carbamazepine (TEGRETOL) tablet 200 mg (200 mg Oral Refused 02/15/17 0125)  divalproex (DEPAKOTE) DR tablet 500 mg (500 mg Oral Refused 02/15/17 0125)  docusate sodium (COLACE) capsule 100 mg (not administered)  hydrOXYzine (ATARAX/VISTARIL) tablet 25 mg (not administered)  trihexyphenidyl (ARTANE) tablet 5 mg (5 mg Oral Given 02/15/17 0724)     Initial Impression / Assessment and Plan / ED Course  I have reviewed the triage vital  signs and the nursing notes.  Pertinent labs & imaging results that were available during my care of the patient were reviewed by me and considered in my medical decision making (see chart for details).  Patient with history of autism and bipolar disorder presenting with hallucinations. Curiously, he told triage nurse that he was having visual hallucinations, and he told me he was having auditory hallucinations. Old records are reviewed, and he does have prior ED visits for hallucinations and also for bipolar disorder. He was seen in the ED yesterday for hallucinations, and had TTS evaluation. This is his 28th ED [facility] for psychiatric problems this year, no hospitalizations associated with any of those ED visits.  TTS consultation appreciated. They are frustrated about patient's frequent ED visits. He will be held overnight to try to clarify status with his group home.  Final Clinical Impressions(s) / ED Diagnoses   Final diagnoses:  Hallucinations  New Prescriptions New Prescriptions   No medications on file     Dione Booze, MD 02/15/17 [phone]

## 2017-02-15 NOTE — BHH Suicide Risk Assessment (Signed)
Suicide Risk Assessment  Discharge Assessment   [facility] Discharge Suicide Risk Assessment   Principal Problem: Adjustment disorder with mixed disturbance of emotions and conduct Discharge Diagnoses:  Patient Active Problem List   Diagnosis Date Noted  . Adjustment disorder with mixed disturbance of emotions and conduct [F43.25] 11/23/2016    Priority: High  . Bipolar affective disorder, current episode mild (HCC) [F31.9] 10/05/2016    Priority: High  . Autism spectrum disorder [F84.0] 08/26/2016    Priority: High  . Auditory hallucinations [R44.0] 01/31/2017    Total Time spent with patient: 45 minutes  Musculoskeletal: Strength & Muscle Tone: within normal limits Gait & Station: normal Patient leans: N/A  Psychiatric Specialty Exam:   Blood pressure 95/62, pulse 67, temperature 97.6 F (36.4 C), temperature source Oral, resp. rate 18, height  (1.727 m), weight 58.1 kg (128 lb), SpO2 100 %.Body mass index is 19.46 kg/m.  General Appearance: Casual  Eye Contact::  Good  Speech:  Normal Rate  Volume:  Normal  Mood:  Euthymic  Affect:  Blunt  Thought Process:  Coherent and Descriptions of Associations: Intact  Orientation:  Full (Time, Place, and Person)  Thought Content:  WDL and Logical  Suicidal Thoughts:  No  Homicidal Thoughts:  No  Memory:  Immediate;   Good Recent;   Good Remote;   Good  Judgement:  Fair  Insight:  Fair  Psychomotor Activity:  Normal  Concentration:  Good  Recall:  Good  Fund of Knowledge:Fair  Language: Good  Akathisia:  No  Handed:  Right  AIMS (if indicated):     Assets:  Housing Leisure Time Physical Health Resilience Social Support  Sleep:     Cognition: WNL  ADL's:  Intact   Mental Status Per Nursing Assessment::   On Admission:   Ran away from his group home which he does often despite saying he likes his group home.  No suicidal/homicidal ideations or substance abuse issues.  He reports having hallucinations but vague  description that does not fit visual hallucinations, not responding to internal stimuli on assessment.  Braxson is well known to these providers.  Encouraged him to use his therapist and psychiatrist in outpatient instead of coming here.  Stable for discharge.  Demographic Factors:  Male, Adolescent or young adult and Caucasian  Loss Factors: NA  Historical Factors: NA  Risk Reduction Factors:   Living with another person, especially a relative, Positive social support and Positive therapeutic relationship  Continued Clinical Symptoms:  None  Cognitive Features That Contribute To Risk:  None    Suicide Risk:  Minimal: No identifiable suicidal ideation.  Patients presenting with no risk factors but with morbid ruminations; may be classified as minimal risk based on the severity of the depressive symptoms    Plan Of Care/Follow-up recommendations:  Activity:  as tolerated Diet:  heart healthy diet  Hinda Lindor, NP 02/15/2017, 10:44 AM

## 2017-02-15 NOTE — ED Triage Notes (Signed)
Pt brought in by [name] from the Group Home.  Pt stated "I'm see things.  I don't want to go back there.  Just sent me to [facility] or [facility]."  Pt unable to elaborate on [facility].

## 2017-02-15 NOTE — Progress Notes (Signed)
LCSWA has reviewed and discussed interventions used with Social Work Tax inspector.   Stacy Gardner, LCSWA Emergency Room Clinical Social Worker     Social work intern contacted Ladene Artist [phone]) who is the owner of [facility] of care group home. Social work Tax inspector informed Derrick that patient is ready for discharge and Ladene Artist said someone from the group home would pick patient up between 1-3 pm today. Derrick also voiced some concerns about patient having thoughts around eleven at night and wishing to "run away". Social work Tax inspector acknowledged Derricks concerns and voiced that we cannot defer him from calling 911 in case of an emergency.   Social work Tax inspector attempted to call legal guardian, Jimmye Norman [phone]), however there was no answer. Social work Scientist, physiological for legal guardian to return phone call.  Nicoletta Dress, Social Work Intern  [phone]

## 2017-02-15 NOTE — ED Notes (Signed)
Bed: WA27 Expected date:  Expected time:  Means of arrival:  Comments: 

## 2017-02-15 NOTE — ED Notes (Signed)
TTS assessment in progress. 
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
## 2017-02-25 ENCOUNTER — Emergency Department (HOSPITAL_COMMUNITY)
Admission: EM | Admit: 2017-02-25 | Discharge: 2017-02-25 | Disposition: A | Discharge status: Other Acute Inpatient Hospital | Payer: Medicare Other | Attending: Emergency Medicine | Admitting: Emergency Medicine

## 2017-02-25 ENCOUNTER — Encounter (HOSPITAL_COMMUNITY): Payer: Self-pay | Admitting: Emergency Medicine

## 2017-02-25 DIAGNOSIS — R440 Auditory hallucinations: Secondary | ICD-10-CM | POA: Diagnosis not present

## 2017-02-25 DIAGNOSIS — Z79899 Other long term (current) drug therapy: Secondary | ICD-10-CM | POA: Diagnosis not present

## 2017-02-25 DIAGNOSIS — F840 Autistic disorder: Secondary | ICD-10-CM | POA: Insufficient documentation

## 2017-02-25 DIAGNOSIS — F17210 Nicotine dependence, cigarettes, uncomplicated: Secondary | ICD-10-CM | POA: Diagnosis not present

## 2017-02-25 LAB — RAPID URINE DRUG SCREEN, HOSP PERFORMED
Amphetamines: NOT DETECTED
Barbiturates: NOT DETECTED
Benzodiazepines: NOT DETECTED
Cocaine: NOT DETECTED
Opiates: NOT DETECTED
Tetrahydrocannabinol: NOT DETECTED

## 2017-02-25 LAB — COMPREHENSIVE METABOLIC PANEL
ALT: 12 U/L — AB (ref 17–63)
AST: 16 U/L (ref 15–41)
Albumin: 4.4 g/dL (ref 3.5–5.0)
Alkaline Phosphatase: 64 U/L (ref 38–126)
Anion gap: 8 (ref 5–15)
BILIRUBIN TOTAL: 0.5 mg/dL (ref 0.3–1.2)
BUN: 14 mg/dL (ref 6–20)
CO2: 27 mmol/L (ref 22–32)
CREATININE: 0.88 mg/dL (ref 0.61–1.24)
Calcium: 9.5 mg/dL (ref 8.9–10.3)
Chloride: 106 mmol/L (ref 101–111)
GFR calc Af Amer: >60 mL/min (ref >60)
Glucose, Bld: 92 mg/dL (ref 65–99)
Potassium: 4.1 mmol/L (ref 3.5–5.1)
Sodium: 141 mmol/L (ref 135–145)
TOTAL PROTEIN: 7.4 g/dL (ref 6.5–8.1)

## 2017-02-25 LAB — CBC WITH DIFFERENTIAL/PLATELET
Basophils Absolute: 0 10*3/uL (ref 0.0–0.1)
Basophils Relative: 0 %
Eosinophils Absolute: 0.1 10*3/uL (ref 0.0–0.7)
Eosinophils Relative: 1 %
HCT: 39.6 % (ref 39.0–52.0)
HEMOGLOBIN: 13.1 g/dL (ref 13.0–17.0)
LYMPHS ABS: 2.2 10*3/uL (ref 0.7–4.0)
LYMPHS PCT: 33 %
MCH: 28.4 pg (ref 26.0–34.0)
MCHC: 33.1 g/dL (ref 30.0–36.0)
MCV: 85.7 fL (ref 78.0–100.0)
Monocytes Absolute: 0.6 10*3/uL (ref 0.1–1.0)
Monocytes Relative: 9 %
NEUTROS PCT: 57 %
Neutro Abs: 3.8 10*3/uL (ref 1.7–7.7)
Platelets: 204 10*3/uL (ref 150–400)
RBC: 4.62 MIL/uL (ref 4.22–5.81)
RDW: 13.2 % (ref 11.5–15.5)
WBC: 6.7 10*3/uL (ref 4.0–10.5)

## 2017-02-25 LAB — ETHANOL

## 2017-02-25 NOTE — ED Provider Notes (Signed)
[facility]-EMERGENCY DEPT Provider Note   CSN: [id number] Arrival date & time: 02/25/17  0031     History   Chief Complaint Chief Complaint  Patient presents with  . Hallucinations    HPI Alexander Fritz is a 26 y.o. male.  Patient with past medical history of bipolar disorder, autism. He is brought for evaluation of auditory hallucinations. He reports hearing voices for the past several days. He is unsure as to what these voices are telling him. He denies to me that he is actively suicidal or homicidal. He denies to me any drug or alcohol use. Patient's responses to questions are somewhat inappropriate, making adequate history taking somewhat difficult.   The history is provided by the patient.  Mental Health Problem  Presenting symptoms: hallucinations   Degree of incapacity (severity):  Moderate Timing:  Constant Progression:  Worsening Chronicity:  Chronic Context: not alcohol use, not drug abuse and not recent medication change   Relieved by:  Nothing Worsened by:  Nothing   Past Medical History:  Diagnosis Date  . Autism   . Bipolar 1 disorder [facility])     Patient Active Problem List   Diagnosis Date Noted  . Auditory hallucinations 01/31/2017  . 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 Bipolar affective disorder, current episode mild (HCC) 10/05/2016  . Autism spectrum disorder 08/26/2016    History reviewed. No pertinent surgical history.     Home Medications    Prior to Admission medications   Medication Sig Start Date End Date Taking? Authorizing Provider  carbamazepine (TEGRETOL) 200 MG tablet Take 1 tablet (200 mg total) by mouth 2 (two) times daily. (0800 & 2000) Patient not taking: Reported on 10/04/2016 09/18/16   Charm Rings, NP  cetirizine (ZYRTEC) 10 MG tablet Take 10 mg by mouth daily. (0800)    [provider]  divalproex (DEPAKOTE) 500 MG DR tablet Take 1 tablet (500 mg total) by mouth 2 (two) times  daily. 11/25/16   Charlynne Pander, MD  docusate sodium (COLACE) 100 MG capsule Take 100 mg by mouth every morning.     [provider]  haloperidol (HALDOL) 1 MG tablet Take 1 tablet (1 mg total) by mouth 2 (two) times daily. Patient not taking: Reported on 02/11/2017 11/23/16   Charm Rings, NP  haloperidol decanoate (HALDOL DECANOATE) 100 MG/ML injection Inject 0.5 mLs (50 mg total) into the muscle every 30 (thirty) days. 12/23/16   Charm Rings, NP  hydrOXYzine (ATARAX/VISTARIL) 25 MG tablet Take 1 tablet (25 mg total) by mouth 2 (two) times daily as needed for anxiety (agitation). 11/23/16   Charm Rings, NP  risperiDONE (RISPERDAL) 2 MG tablet Take 1 tablet (2 mg total) by mouth 2 (two) times daily. Patient not taking: Reported on 10/04/2016 09/18/16   Charm Rings, NP  trihexyphenidyl (ARTANE) 5 MG tablet Take 1 tablet (5 mg total) by mouth 2 (two) times daily with breakfast and lunch. (0800 & 2000) 11/23/16   Charm Rings, NP    Family History History reviewed. No pertinent family history.  Social History Social History  Substance Use Topics  . Smoking status: Current Every Day Smoker    Packs/day: 0.50    Years: 5.00    Types: Cigarettes  . Smokeless tobacco: Former Neurosurgeon  . Alcohol use No     Allergies   Cefaclor and Lithium   Review of Systems Review of Systems  Psychiatric/Behavioral: Positive for hallucinations.  All  other systems reviewed and are negative.    Physical Exam Updated Vital Signs BP 120/67 (BP Location: Right Arm)   Pulse 90   Temp 97.7 F (36.5 C) (Oral)   Resp 18   Ht  (1.727 m)   Wt 58.1 kg (128 lb)   SpO2 100%   BMI 19.46 kg/m   Physical Exam  Constitutional: He is oriented to person, place, and time. He appears well-developed and well-nourished. No distress.  HENT:  Head: Normocephalic and atraumatic.  Mouth/Throat: Oropharynx is clear and moist.  Eyes: Pupils are equal, round, and reactive to light. EOM are  normal.  Neck: Normal range of motion. Neck supple.  Cardiovascular: Normal rate and regular rhythm.  Exam reveals no friction rub.   No murmur heard. Pulmonary/Chest: Effort normal and breath sounds normal. No respiratory distress. He has no wheezes. He has no rales.  Abdominal: Soft. Bowel sounds are normal. He exhibits no distension. There is no tenderness.  Musculoskeletal: Normal range of motion. He exhibits no edema.  Neurological: He is alert and oriented to person, place, and time. No cranial nerve deficit. He exhibits normal muscle tone. Coordination normal.  Skin: Skin is warm and dry. He is not diaphoretic.  Psychiatric: His affect is blunt. His speech is delayed. He is slowed and withdrawn. He expresses inappropriate judgment. He expresses no homicidal and no suicidal ideation.  Nursing note and vitals reviewed.    ED Treatments / Results  Labs (all labs ordered are listed, but only abnormal results are displayed) Labs Reviewed  COMPREHENSIVE METABOLIC PANEL  ETHANOL  RAPID URINE DRUG SCREEN, HOSP PERFORMED  CBC WITH DIFFERENTIAL/PLATELET    EKG  EKG Interpretation None       Radiology No results found.  Procedures Procedures (including critical care time)  Medications Ordered in ED Medications - No data to display   Initial Impression / Assessment and Plan / ED Course  I have reviewed the triage vital signs and the nursing notes.  Pertinent labs & imaging results that were available during my care of the patient were reviewed by me and considered in my medical decision making (see chart for details).  Patient seen by TTS who is very familiar with his case. They feel as though he is at his baseline and does not require inpatient treatment. He will be discharged, to follow-up as an outpatient.  Final Clinical Impressions(s) / ED Diagnoses   Final diagnoses:  None    New Prescriptions New Prescriptions   No medications on file     Geoffery Lyons,  MD 02/25/17 [phone]

## 2017-02-25 NOTE — BH Assessment (Signed)
[facility] Assessment Progress Note  EDP Dr. Judd Lien, MD and Roseanna Rainbow, RN notified of disposition to d/c pt back to the care of the group home. TTS attempted to contact the pt's legal guardian and group home staff to advise pt is not recommended for inpt treatment and recommended for d/c back to group home but did not receive an answer.   HIPPA compliant voicemail left: Group Home - [facility], Owner "Ladene Artist" [phone] [place], [phone]  Princess Bruins, MSW, LCSW Therapeutic Triage Specialist  [phone]

## 2017-02-25 NOTE — ED Notes (Signed)
Left message on group home manager's number as no one has been by to pick pt up.

## 2017-02-25 NOTE — ED Notes (Signed)
Bed: WTR7 Expected date:  Expected time:  Means of arrival:  Comments: 

## 2017-02-25 NOTE — BH Assessment (Signed)
[facility] Assessment Progress Note   Per Nira Conn, NP pt does not meet criteria for inpt treatment. Pt has hx of ED visits c/o similar concerns. Pt expresses ongoing AH and is at baseline. Pt is psych cleared and recommended for d/c to current provider.   Princess Bruins, MSW, LCSW Therapeutic Triage Specialist  [phone]

## 2017-02-25 NOTE — ED Triage Notes (Signed)
Pt presents for hallucinations along with anxiety attacks. Pt denies any SI/HI at this time. Pt states he has been hearing voices but not command.

## 2017-02-25 NOTE — ED Notes (Signed)
Spoke with Ladene Artist from group home and advised that pt is ready for pickup. He sts " someone will be there to pick up patient."
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
## 2017-02-25 NOTE — ED Notes (Signed)
Called and left message for [facility] and Legal guardian to see if they could come and pick up pt.

## 2017-02-25 NOTE — ED Notes (Signed)
[facility]. Ladene Artist states "someone will be there between now and 2pm."

## 2017-02-25 NOTE — ED Notes (Signed)
Attempted to reach group home x2. No one answering phone.

## 2017-07-23 ENCOUNTER — Encounter (HOSPITAL_COMMUNITY): Payer: Self-pay | Admitting: Emergency Medicine

## 2017-07-23 ENCOUNTER — Emergency Department (HOSPITAL_COMMUNITY)
Admission: EM | Admit: 2017-07-23 | Discharge: 2017-07-23 | Disposition: A | Discharge status: Home / Self Care | Payer: Medicare Other | Attending: Emergency Medicine | Admitting: Emergency Medicine

## 2017-07-23 ENCOUNTER — Other Ambulatory Visit: Payer: Self-pay

## 2017-07-23 DIAGNOSIS — F259 Schizoaffective disorder, unspecified: Secondary | ICD-10-CM | POA: Insufficient documentation

## 2017-07-23 DIAGNOSIS — F4325 Adjustment disorder with mixed disturbance of emotions and conduct: Secondary | ICD-10-CM

## 2017-07-23 DIAGNOSIS — Z79899 Other long term (current) drug therapy: Secondary | ICD-10-CM | POA: Diagnosis not present

## 2017-07-23 DIAGNOSIS — R440 Auditory hallucinations: Secondary | ICD-10-CM

## 2017-07-23 DIAGNOSIS — F17210 Nicotine dependence, cigarettes, uncomplicated: Secondary | ICD-10-CM | POA: Diagnosis not present

## 2017-07-23 DIAGNOSIS — F419 Anxiety disorder, unspecified: Secondary | ICD-10-CM | POA: Insufficient documentation

## 2017-07-23 DIAGNOSIS — R45851 Suicidal ideations: Secondary | ICD-10-CM | POA: Diagnosis not present

## 2017-07-23 DIAGNOSIS — F840 Autistic disorder: Secondary | ICD-10-CM | POA: Diagnosis not present

## 2017-07-23 LAB — COMPREHENSIVE METABOLIC PANEL
ALK PHOS: 51 U/L (ref 38–126)
ALT: 22 U/L (ref 17–63)
ANION GAP: 9 (ref 5–15)
AST: 22 U/L (ref 15–41)
Albumin: 4 g/dL (ref 3.5–5.0)
BUN: 18 mg/dL (ref 6–20)
CO2: 25 mmol/L (ref 22–32)
Calcium: 9 mg/dL (ref 8.9–10.3)
Chloride: 104 mmol/L (ref 101–111)
Creatinine, Ser: 1.02 mg/dL (ref 0.61–1.24)
GFR calc Af Amer: >60 mL/min (ref >60)
GFR calc non Af Amer: >60 mL/min (ref >60)
GLUCOSE: 88 mg/dL (ref 65–99)
POTASSIUM: 3.7 mmol/L (ref 3.5–5.1)
SODIUM: 138 mmol/L (ref 135–145)
Total Bilirubin: 0.4 mg/dL (ref 0.3–1.2)
Total Protein: 6.9 g/dL (ref 6.5–8.1)

## 2017-07-23 LAB — CBC
HEMATOCRIT: 42.4 % (ref 39.0–52.0)
HEMOGLOBIN: 14.6 g/dL (ref 13.0–17.0)
MCH: 30.3 pg (ref 26.0–34.0)
MCHC: 34.4 g/dL (ref 30.0–36.0)
MCV: 88 fL (ref 78.0–100.0)
Platelets: 217 10*3/uL (ref 150–400)
RBC: 4.82 MIL/uL (ref 4.22–5.81)
RDW: 12.1 % (ref 11.5–15.5)
WBC: 6 10*3/uL (ref 4.0–10.5)

## 2017-07-23 LAB — RAPID URINE DRUG SCREEN, HOSP PERFORMED
Amphetamines: NOT DETECTED
BARBITURATES: NOT DETECTED
BENZODIAZEPINES: NOT DETECTED
COCAINE: NOT DETECTED
Opiates: NOT DETECTED
TETRAHYDROCANNABINOL: NOT DETECTED

## 2017-07-23 LAB — ACETAMINOPHEN LEVEL

## 2017-07-23 LAB — ETHANOL: Alcohol, Ethyl (B): <10 mg/dL (ref <10)

## 2017-07-23 LAB — SALICYLATE LEVEL: Salicylate Lvl: <7 mg/dL (ref 2.8–30.0)

## 2017-07-23 MED ORDER — TRIHEXYPHENIDYL HCL 5 MG PO TABS
5.0000 mg | ORAL_TABLET | Freq: Two times a day (BID) | ORAL | Status: DC
  Administered 2017-07-23 (×2): 5 mg via ORAL
  Filled 2017-07-23 (×2): qty 1

## 2017-07-23 MED ORDER — OLANZAPINE 10 MG PO TBDP: 10.0000 mg | ORAL_TABLET | Freq: Three times a day (TID) | ORAL | Status: DC | PRN

## 2017-07-23 MED ORDER — ZIPRASIDONE MESYLATE 20 MG IM SOLR: 20.0000 mg | INTRAMUSCULAR | Status: DC | PRN

## 2017-07-23 MED ORDER — LORAZEPAM 1 MG PO TABS: 1.0000 mg | ORAL_TABLET | ORAL | Status: DC | PRN

## 2017-07-23 MED ORDER — RISPERIDONE 1 MG PO TBDP: 2.0000 mg | ORAL_TABLET | Freq: Three times a day (TID) | ORAL | Status: DC | PRN

## 2017-07-23 MED ORDER — ZIPRASIDONE MESYLATE 20 MG IM SOLR
20.0000 mg | Freq: Once | INTRAMUSCULAR | Status: AC
Start: 2017-07-23 — End: 2017-07-23
  Administered 2017-07-23: 20 mg via INTRAMUSCULAR
  Filled 2017-07-23: qty 20

## 2017-07-23 MED ORDER — STERILE WATER FOR INJECTION IJ SOLN
INTRAMUSCULAR | Status: AC
  Administered 2017-07-23: 1.2 mL
  Filled 2017-07-23: qty 10

## 2017-07-23 MED ORDER — CARBAMAZEPINE 200 MG PO TABS
200.0000 mg | ORAL_TABLET | Freq: Two times a day (BID) | ORAL | Status: DC
  Administered 2017-07-23 (×2): 200 mg via ORAL
  Filled 2017-07-23 (×2): qty 1

## 2017-07-23 MED ORDER — ACETAMINOPHEN 325 MG PO TABS: 650.0000 mg | ORAL_TABLET | ORAL | Status: DC | PRN

## 2017-07-23 MED ORDER — HYDROXYZINE HCL 25 MG PO TABS: 25.0000 mg | ORAL_TABLET | Freq: Two times a day (BID) | ORAL | Status: DC | PRN

## 2017-07-23 MED ORDER — RISPERIDONE 2 MG PO TABS
2.0000 mg | ORAL_TABLET | Freq: Two times a day (BID) | ORAL | Status: DC
  Administered 2017-07-23 (×2): 2 mg via ORAL
  Filled 2017-07-23 (×2): qty 1

## 2017-07-23 MED ORDER — DIVALPROEX SODIUM 500 MG PO DR TAB
500.0000 mg | DELAYED_RELEASE_TABLET | Freq: Two times a day (BID) | ORAL | Status: DC
  Administered 2017-07-23 (×2): 500 mg via ORAL
  Filled 2017-07-23 (×2): qty 1

## 2017-07-23 MED ORDER — HALOPERIDOL 1 MG PO TABS
1.0000 mg | ORAL_TABLET | Freq: Two times a day (BID) | ORAL | Status: DC
  Administered 2017-07-23 (×2): 1 mg via ORAL
  Filled 2017-07-23 (×2): qty 1

## 2017-07-23 NOTE — Progress Notes (Addendum)
CSW spoke with Alexander Fritz with [facility] to notify him that patient is medically/ psychiatrically cleared for discharge. Mr. Alexander Fritz stated someone will pick patient up by 3PM today.   CSW notified patients legal guardian, Alexander Fritz, at [phone] of discharge from hospital.  Alexander Fritz, [facility]WA Emergency Room Clinical Social Worker [phone]

## 2017-07-23 NOTE — ED Notes (Signed)
Bed: WTR5 Expected date:  Expected time:  Means of arrival:  Comments: 

## 2017-07-23 NOTE — Progress Notes (Signed)
Pt is recommended for continued observation for safety and stabilization and to be reassessed in the AM by psych. EDP Pollina, Canary Brimhristopher J, MD advised of the disposition.  Alexander Fritz, MSW, LCSW Therapeutic Triage Specialist  [phone]

## 2017-07-23 NOTE — ED Notes (Signed)
Spoke with Ladene ArtistDerrick from Group Home who reports patient transport is on the way.

## 2017-07-23 NOTE — [facility] Suicide Risk Assessment (Signed)
Suicide Risk Assessment  Discharge Assessment   [facility]   Principal Problem: Autism spectrum disorder Discharge Diagnoses:  Patient Active Problem List   Diagnosis Date Noted  . Auditory hallucinations [R44.0] 01/31/2017  . Adjustment disorder with mixed disturbance of emotions and conduct [F43.25] 11/23/2016  . Bipolar affective disorder, current episode mild (HCC) [F31.9] 10/05/2016  . Autism spectrum disorder [F84.0] 08/26/2016   Pt was seen and chart reviewed with treatment team and Dr Sharma CovertNorman. Pt stated he was anxious and that is why he came to the emergency room. Today, Pt stated he felt much better and was ready to go back to his group home.  Pt denies suicidal/homicidal ideation, denies auditory/visual hallucinations and does not appear to be responding to internal stimuli. Pt has been calm and cooperative in the [facility]. Pt is psychiatrically clear for discharge.   Total Time spent with patient: 30 minutes  Musculoskeletal: Strength & Muscle Tone: within normal limits Gait & Station: normal Patient leans: N/A  Psychiatric Specialty Exam:   Blood pressure 103/67, pulse 72, temperature 99.3 F (37.4 C), temperature source Oral, resp. rate 15, height 5\' 8"  (1.727 m), weight 59.9 kg (132 lb), SpO2 100 %.Body mass index is 20.07 kg/m.  General Appearance: Casual  Eye Contact::  Good  Speech:  Clear and Coherent409  Volume:  Normal  Mood:  Anxious  Affect:  Congruent  Thought Process:  Coherent and Linear  Orientation:  Full (Time, Place, and Person)  Thought Content:  Logical  Suicidal Thoughts:  No  Homicidal Thoughts:  No  Memory:  Immediate;   Good Recent;   Good Remote;   Fair  Judgement:  Fair  Insight:  Fair  Psychomotor Activity:  Normal  Concentration:  Good  Recall:  Good  Fund of Knowledge:Good  Language: Good  Akathisia:  No  Handed:  Right  AIMS (if indicated):     Assets:  Medical laboratory scientific officerCommunication Skills Financial  Resources/Insurance Housing Physical Health Social Support  Sleep:     Cognition: WNL  ADL's:  Intact   Mental Status Per Nursing Assessment::   On Admission:   Anxious  Demographic Factors:  Male, Adolescent or young adult and Caucasian  Loss Factors: NA  Historical Factors: Impulsivity  Risk Reduction Factors:   Sense of responsibility to family, Living with another person, especially a relative and Positive social support  Continued Clinical Symptoms:  Severe Anxiety and/or Agitation Depression:   Impulsivity  Cognitive Features That Contribute To Risk:  Closed-mindedness    Suicide Risk:  Minimal: No identifiable suicidal ideation.  Patients presenting with no risk factors but with morbid ruminations; may be classified as minimal risk based on the severity of the depressive symptoms    Plan Of Care/Follow-up recommendations:  Activity:  as tolerated Diet:  Heart healthy  Laveda AbbeLaurie Britton Karim Aiello, NP 07/23/2017, 2:59 PM
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
## 2017-07-23 NOTE — BH Assessment (Signed)
[facility]  Per Juanetta BeetsJacqueline Norman, DO, this pt does not require psychiatric hospitalization at this time.  [facility]  Stacy GardnerErin Davenport, LCSW agrees to facilitate this.  Pt's nurse has been notified.  Doylene Canninghomas Kymere Fullington, MA Triage Specialist [phone]

## 2017-07-23 NOTE — ED Notes (Signed)
Spoke with Ladene ArtistDerrick who reports patient's ride should be nearby. Reports he will reach out to transportation.

## 2017-07-23 NOTE — ED Notes (Signed)
Bed: WLPT2 Expected date:  Expected time:  Means of arrival:  Comments: 

## 2017-07-23 NOTE — ED Notes (Signed)
Bed: WLPT1 Expected date:  Expected time:  Means of arrival:  Comments: 

## 2017-07-23 NOTE — ED Notes (Signed)
Bed: [facility] Expected date:  Expected time:  Means of arrival:  Comments: Hold for room 20
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
## 2017-09-26 ENCOUNTER — Emergency Department (HOSPITAL_COMMUNITY)
Admission: EM | Admit: 2017-09-26 | Discharge: 2017-09-29 | Disposition: A | Discharge status: Home / Self Care | Payer: Medicare Other | Attending: Emergency Medicine | Admitting: Emergency Medicine

## 2017-09-26 ENCOUNTER — Encounter (HOSPITAL_COMMUNITY): Payer: Self-pay | Admitting: Emergency Medicine

## 2017-09-26 ENCOUNTER — Other Ambulatory Visit: Payer: Self-pay

## 2017-09-26 DIAGNOSIS — F840 Autistic disorder: Secondary | ICD-10-CM | POA: Diagnosis not present

## 2017-09-26 DIAGNOSIS — R443 Hallucinations, unspecified: Secondary | ICD-10-CM | POA: Diagnosis present

## 2017-09-26 DIAGNOSIS — R456 Violent behavior: Secondary | ICD-10-CM | POA: Diagnosis not present

## 2017-09-26 DIAGNOSIS — Z79899 Other long term (current) drug therapy: Secondary | ICD-10-CM | POA: Diagnosis not present

## 2017-09-26 DIAGNOSIS — R4587 Impulsiveness: Secondary | ICD-10-CM | POA: Diagnosis not present

## 2017-09-26 DIAGNOSIS — R45850 Homicidal ideations: Secondary | ICD-10-CM | POA: Insufficient documentation

## 2017-09-26 DIAGNOSIS — F250 Schizoaffective disorder, bipolar type: Secondary | ICD-10-CM | POA: Diagnosis present

## 2017-09-26 DIAGNOSIS — R45851 Suicidal ideations: Secondary | ICD-10-CM | POA: Diagnosis not present

## 2017-09-26 DIAGNOSIS — F17210 Nicotine dependence, cigarettes, uncomplicated: Secondary | ICD-10-CM | POA: Diagnosis not present

## 2017-09-26 DIAGNOSIS — F29 Unspecified psychosis not due to a substance or known physiological condition: Secondary | ICD-10-CM | POA: Diagnosis not present

## 2017-09-26 DIAGNOSIS — F419 Anxiety disorder, unspecified: Secondary | ICD-10-CM | POA: Diagnosis not present

## 2017-09-26 LAB — COMPREHENSIVE METABOLIC PANEL
ALBUMIN: 4.4 g/dL (ref 3.5–5.0)
ALK PHOS: 47 U/L (ref 38–126)
ALT: 16 U/L — AB (ref 17–63)
ANION GAP: 13 (ref 5–15)
AST: 18 U/L (ref 15–41)
BILIRUBIN TOTAL: 0.7 mg/dL (ref 0.3–1.2)
BUN: 13 mg/dL (ref 6–20)
CALCIUM: 9.2 mg/dL (ref 8.9–10.3)
CO2: 23 mmol/L (ref 22–32)
CREATININE: 0.82 mg/dL (ref 0.61–1.24)
Chloride: 101 mmol/L (ref 101–111)
GFR calc Af Amer: >60 mL/min (ref >60)
GFR calc non Af Amer: >60 mL/min (ref >60)
GLUCOSE: 79 mg/dL (ref 65–99)
Potassium: 3.8 mmol/L (ref 3.5–5.1)
SODIUM: 137 mmol/L (ref 135–145)
TOTAL PROTEIN: 7.7 g/dL (ref 6.5–8.1)

## 2017-09-26 LAB — CBC
HCT: 43.3 % (ref 39.0–52.0)
Hemoglobin: 15.2 g/dL (ref 13.0–17.0)
MCH: 30.7 pg (ref 26.0–34.0)
MCHC: 35.1 g/dL (ref 30.0–36.0)
MCV: 87.5 fL (ref 78.0–100.0)
Platelets: 242 10*3/uL (ref 150–400)
RBC: 4.95 MIL/uL (ref 4.22–5.81)
RDW: 11.9 % (ref 11.5–15.5)
WBC: 9.3 10*3/uL (ref 4.0–10.5)

## 2017-09-26 LAB — RAPID URINE DRUG SCREEN, HOSP PERFORMED
Amphetamines: NOT DETECTED
BARBITURATES: NOT DETECTED
Benzodiazepines: NOT DETECTED
Cocaine: NOT DETECTED
Opiates: NOT DETECTED
Tetrahydrocannabinol: NOT DETECTED

## 2017-09-26 LAB — ETHANOL: Alcohol, Ethyl (B): <10 mg/dL (ref <10)

## 2017-09-26 LAB — ACETAMINOPHEN LEVEL

## 2017-09-26 LAB — SALICYLATE LEVEL: Salicylate Lvl: <7 mg/dL (ref 2.8–30.0)

## 2017-09-26 MED ORDER — ACETAMINOPHEN 325 MG PO TABS: 650.0000 mg | ORAL_TABLET | ORAL | Status: DC | PRN

## 2017-09-26 MED ORDER — ONDANSETRON HCL 4 MG PO TABS: 4.0000 mg | ORAL_TABLET | Freq: Three times a day (TID) | ORAL | Status: DC | PRN

## 2017-09-26 NOTE — ED Notes (Signed)
Bed: [facility] Expected date:  Expected time:  Means of arrival:  Comments: 

## 2017-09-26 NOTE — ED Triage Notes (Signed)
Pt complaint of paranoia and SI without pain.
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
## 2017-09-26 NOTE — ED Provider Notes (Signed)
[facility]   CSN: [id number] Arrival date & time: 09/26/17  1507     History   Chief Complaint Chief Complaint  Patient presents with  . Suicidal  . Paranoid    HPI Alexander Fritz is a 27 y.o. male.  The history is provided by the patient and medical records.  Mental Health Problem  Presenting symptoms: disorganized speech, hallucinations, homicidal ideas, paranoid behavior and suicidal thoughts   Presenting symptoms: no agitation   Degree of incapacity (severity):  Severe Onset quality:  Gradual Timing:  Constant Chronicity:  New Context: not alcohol use, not drug abuse and not recent medication change   Treatment compliance:  All of the time Relieved by:  Nothing Associated symptoms: no abdominal pain, no anxiety, no chest pain, no fatigue, no headaches and no irritability   Risk factors: hx of mental illness     Past Medical History:  Diagnosis Date  . Autism   . Bipolar 1 disorder [facility])     Patient Active Problem List   Diagnosis Date Noted  . Auditory hallucinations 01/31/2017  . 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 Bipolar affective disorder, current episode mild (HCC) 10/05/2016  . Autism spectrum disorder 08/26/2016    History reviewed. No pertinent surgical history.      Home Medications    Prior to Admission medications   Medication Sig Start Date End Date Taking? Authorizing Provider  divalproex (DEPAKOTE) 500 MG DR tablet Take 1 tablet (500 mg total) by mouth 2 (two) times daily. 11/25/16  Yes Charlynne Pander, MD  carbamazepine (TEGRETOL) 200 MG tablet Take 1 tablet (200 mg total) by mouth 2 (two) times daily. (0800 & 2000) Patient not taking: Reported on 10/04/2016 09/18/16   Charm Rings, NP  cetirizine (ZYRTEC) 10 MG tablet Take 10 mg by mouth daily. (0800)    [provider]  docusate sodium (COLACE) 100 MG capsule Take 100 mg by mouth every  morning.     [provider]  haloperidol (HALDOL) 1 MG tablet Take 1 tablet (1 mg total) by mouth 2 (two) times daily. Patient not taking: Reported on 02/11/2017 11/23/16   Charm Rings, NP  haloperidol decanoate (HALDOL DECANOATE) 100 MG/ML injection Inject 0.5 mLs (50 mg total) into the muscle every 30 (thirty) days. 12/23/16   Charm Rings, NP  hydrOXYzine (ATARAX/VISTARIL) 25 MG tablet Take 1 tablet (25 mg total) by mouth 2 (two) times daily as needed for anxiety (agitation). 11/23/16   Charm Rings, NP  risperiDONE (RISPERDAL) 2 MG tablet Take 1 tablet (2 mg total) by mouth 2 (two) times daily. Patient not taking: Reported on 10/04/2016 09/18/16   Charm Rings, NP  trihexyphenidyl (ARTANE) 5 MG tablet Take 1 tablet (5 mg total) by mouth 2 (two) times daily with breakfast and lunch. (0800 & 2000) 11/23/16   Charm Rings, NP    Family History No family history on file.  Social History Social History   Tobacco Use  . Smoking status: Current Every Day Smoker    Packs/day: 0.50    Years: 5.00    Pack years: 2.50    Types: Cigarettes  . Smokeless tobacco: Former Engineer, water Use Topics  . Alcohol use: No  . Drug use: No     Allergies   Cefaclor and Lithium   Review of Systems Review of Systems  Constitutional: Negative for activity change, chills, diaphoresis, fatigue,  fever and irritability.  HENT: Negative for congestion and rhinorrhea.   Eyes: Negative for visual disturbance.  Respiratory: Negative for cough, chest tightness, shortness of breath, wheezing and stridor.   Cardiovascular: Negative for chest pain, palpitations and leg swelling.  Gastrointestinal: Negative for abdominal distention, abdominal pain, blood in stool, constipation, diarrhea, nausea and vomiting.  Genitourinary: Negative for difficulty urinating, dysuria and flank pain.  Musculoskeletal: Negative for back pain and gait problem.  Skin: Negative for rash and wound.  Neurological:  Negative for dizziness, weakness, light-headedness and headaches.  Psychiatric/Behavioral: Positive for hallucinations, homicidal ideas, paranoia and suicidal ideas. Negative for agitation. The patient is not nervous/anxious.   All other systems reviewed and are negative.    Physical Exam Updated Vital Signs BP 117/69 (BP Location: Right Arm)   Pulse 90   Temp 97.7 F (36.5 C) (Oral)   Resp 16   SpO2 98%   Physical Exam  Constitutional: He is oriented to person, place, and time. He appears well-developed and well-nourished.  HENT:  Head: Normocephalic and atraumatic.  Mouth/Throat: Oropharynx is clear and moist. No oropharyngeal exudate.  Eyes: Conjunctivae are normal.  Neck: Neck supple.  Cardiovascular: Normal rate and regular rhythm.  No murmur heard. Pulmonary/Chest: Effort normal and breath sounds normal. No respiratory distress. He has no wheezes. He exhibits no tenderness.  Abdominal: Soft. There is no tenderness.  Musculoskeletal: He exhibits no edema or tenderness.  Neurological: He is alert and oriented to person, place, and time.  Skin: Skin is warm and dry.  Psychiatric: He has a normal mood and affect. He is actively hallucinating. Thought content is paranoid. He expresses homicidal and suicidal ideation. He expresses no suicidal plans and no homicidal plans.  Nursing note and vitals reviewed.    ED Treatments / Results  Labs (all labs ordered are listed, but only abnormal results are displayed) Labs Reviewed  COMPREHENSIVE METABOLIC PANEL - Abnormal; Notable for the following components:      Result Value   ALT 16 (*)    All other components within normal limits  ACETAMINOPHEN LEVEL - Abnormal; Notable for the following components:   Acetaminophen (Tylenol), Serum <10 (*)    All other components within normal limits  ETHANOL  SALICYLATE LEVEL  CBC  RAPID URINE DRUG SCREEN, HOSP PERFORMED    EKG None  Radiology No results  found.  Procedures Procedures (including critical care time)  Medications Ordered in ED Medications - No data to display   Initial Impression / Assessment and Plan / ED Course  I have reviewed the triage vital signs and the nursing notes.  Pertinent labs & imaging results that were available during my care of the patient were reviewed by me and considered in my medical decision making (see chart for details).     Alexander Fritz is a 27 y.o. male with a past medical history significant for autism and bipolar disorder who presents with hallucinations, SI, HI, and paranoia.  Patient was resistant to talk to this examiner initially however he was willing to talk to a psychiatrist.  He did report that he has been having suicidal homicidal thoughts but would not clarify any plans.  Patient says that he has been having auditory and visual hallucinations and says he is actively having them.  He perseverated on wanting to be started on lithium and that he wanted to be taken off of 1 of his other psychiatric medicines.  He reports he has been taking them and denies any drug  or alcohol abuse.  He is here voluntarily.  He denies any history of hurting himself.  He denies any physical complaint on arrival.  He is a very difficult historian getting distracted.  On exam, lungs clear chest nontender.  Abdomen nontender.  Patient was re-laboratory testing.  Anticipate patient will be medically cleared for TTS evaluation.        Patient's laboratory testing reassuring.  Patient is medically cleared for psych evaluation.  Psychiatry about the patient and request to be reassessed in the morning.   Final Clinical Impressions(s) / ED Diagnoses   Final diagnoses:  Suicidal ideation    Clinical Impression: 1. Suicidal ideation     Disposition: Awaiting reassessment by psychiatry in the morning.      Teodor Prater, Canary Brim, MD 09/26/17 2253

## 2017-09-26 NOTE — ED Notes (Signed)
Bed: [facility] Expected date:  Expected time:  Means of arrival:  Comments: 

## 2017-09-27 DIAGNOSIS — F250 Schizoaffective disorder, bipolar type: Secondary | ICD-10-CM

## 2017-09-27 DIAGNOSIS — F17210 Nicotine dependence, cigarettes, uncomplicated: Secondary | ICD-10-CM

## 2017-09-27 DIAGNOSIS — Z6222 Institutional upbringing: Secondary | ICD-10-CM | POA: Diagnosis not present

## 2017-09-27 DIAGNOSIS — F840 Autistic disorder: Secondary | ICD-10-CM | POA: Diagnosis not present

## 2017-09-27 DIAGNOSIS — F419 Anxiety disorder, unspecified: Secondary | ICD-10-CM

## 2017-09-27 DIAGNOSIS — F29 Unspecified psychosis not due to a substance or known physiological condition: Secondary | ICD-10-CM | POA: Diagnosis not present

## 2017-09-27 MED ORDER — TRIHEXYPHENIDYL HCL 5 MG PO TABS
5.0000 mg | ORAL_TABLET | Freq: Two times a day (BID) | ORAL | Status: DC
  Administered 2017-09-28 – 2017-09-29 (×2): 5 mg via ORAL
  Filled 2017-09-27 (×2): qty 1

## 2017-09-27 MED ORDER — DOCUSATE SODIUM 100 MG PO CAPS
100.0000 mg | ORAL_CAPSULE | Freq: Every morning | ORAL | Status: DC
  Administered 2017-09-28 – 2017-09-29 (×2): 100 mg via ORAL
  Filled 2017-09-27 (×2): qty 1

## 2017-09-27 MED ORDER — HALOPERIDOL 5 MG PO TABS
5.0000 mg | ORAL_TABLET | Freq: Two times a day (BID) | ORAL | Status: DC
  Administered 2017-09-27 – 2017-09-29 (×4): 5 mg via ORAL
  Filled 2017-09-27 (×6): qty 1

## 2017-09-27 MED ORDER — DIVALPROEX SODIUM 500 MG PO DR TAB
500.0000 mg | DELAYED_RELEASE_TABLET | Freq: Two times a day (BID) | ORAL | Status: DC
  Administered 2017-09-27 – 2017-09-29 (×4): 500 mg via ORAL
  Filled 2017-09-27 (×4): qty 1

## 2017-09-27 MED ORDER — HYDROXYZINE HCL 25 MG PO TABS
25.0000 mg | ORAL_TABLET | Freq: Two times a day (BID) | ORAL | Status: DC | PRN
  Administered 2017-09-28 (×2): 25 mg via ORAL
  Filled 2017-09-27 (×2): qty 1
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
## 2017-09-28 DIAGNOSIS — F840 Autistic disorder: Secondary | ICD-10-CM | POA: Diagnosis not present

## 2017-09-28 DIAGNOSIS — F419 Anxiety disorder, unspecified: Secondary | ICD-10-CM | POA: Diagnosis not present

## 2017-09-28 DIAGNOSIS — F29 Unspecified psychosis not due to a substance or known physiological condition: Secondary | ICD-10-CM | POA: Diagnosis not present

## 2017-09-28 DIAGNOSIS — F17210 Nicotine dependence, cigarettes, uncomplicated: Secondary | ICD-10-CM | POA: Diagnosis not present

## 2017-09-28 DIAGNOSIS — Z6222 Institutional upbringing: Secondary | ICD-10-CM | POA: Diagnosis not present

## 2017-09-28 DIAGNOSIS — F250 Schizoaffective disorder, bipolar type: Secondary | ICD-10-CM | POA: Diagnosis not present

## 2017-09-28 MED ORDER — NICOTINE 21 MG/24HR TD PT24
21.0000 mg | MEDICATED_PATCH | Freq: Once | TRANSDERMAL | Status: AC
  Administered 2017-09-28: 21 mg via TRANSDERMAL
  Filled 2017-09-28: qty 1

## 2017-09-28 MED ORDER — HALOPERIDOL DECANOATE 100 MG/ML IM SOLN
100.0000 mg | Freq: Once | INTRAMUSCULAR | Status: AC
  Administered 2017-09-28: 100 mg via INTRAMUSCULAR
  Filled 2017-09-28: qty 1

## 2017-09-28 MED ORDER — STERILE WATER FOR INJECTION IJ SOLN
INTRAMUSCULAR | Status: AC
  Administered 2017-09-28: 10 mL
  Filled 2017-09-28: qty 10

## 2017-09-28 MED ORDER — ZIPRASIDONE MESYLATE 20 MG IM SOLR
20.0000 mg | Freq: Once | INTRAMUSCULAR | Status: AC
  Administered 2017-09-28: 20 mg via INTRAMUSCULAR
  Filled 2017-09-28: qty 20

## 2017-09-28 NOTE — ED Notes (Signed)
Pt A&O x 3, no distress noted, calm & cooperative at present.  Watching TV at present.  Monitoring for safety, Q 15 min checks in effect. 
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
## 2017-09-28 NOTE — ED Notes (Signed)
Report given to [facility], [place] in [place]. Security on unit to escort him back at this time.

## 2017-09-28 NOTE — ED Notes (Signed)
Patient currently asleep with no noted distress. Sitter remains at the bedside for safety.

## 2017-09-28 NOTE — ED Notes (Signed)
Patient is alert and oriented to person.  Presents with flat affect and depressed mood.  Medications given po and IM as prescribed.  No behavioral issues noted since transferred to the unit.  Patient is in his room sleeping with eyes closed.  Denies suicidal thoughts, auditory and visual hallucinations.  Routine safety checks maintained.  Patient is safe on the unit.
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
## 2017-09-29 DIAGNOSIS — F250 Schizoaffective disorder, bipolar type: Secondary | ICD-10-CM | POA: Diagnosis not present

## 2017-09-29 DIAGNOSIS — R456 Violent behavior: Secondary | ICD-10-CM | POA: Diagnosis not present

## 2017-09-29 DIAGNOSIS — R4587 Impulsiveness: Secondary | ICD-10-CM | POA: Diagnosis not present

## 2017-09-29 DIAGNOSIS — F17210 Nicotine dependence, cigarettes, uncomplicated: Secondary | ICD-10-CM | POA: Diagnosis not present

## 2017-09-29 MED ORDER — TRAZODONE HCL 50 MG PO TABS
50.0000 mg | ORAL_TABLET | Freq: Every evening | ORAL | Status: DC | PRN
Start: 2017-09-29 — End: 2017-09-29
  Administered 2017-09-29: 50 mg via ORAL
  Filled 2017-09-29: qty 1

## 2017-09-29 NOTE — [facility] Suicide Risk Assessment (Cosign Needed Addendum)
Suicide Risk Assessment  Discharge Assessment   [facility] Discharge Suicide Risk Assessment   Principal Problem: Schizoaffective disorder, bipolar type [facility] Discharge Diagnoses:  Patient Active Problem List   Diagnosis Date Noted  . Auditory hallucinations [R44.0] 01/31/2017  . Schizoaffective disorder, bipolar type (HCC) [F25.0] 10/05/2016  . Autism spectrum disorder [F84.0] 08/26/2016    Total Time spent with patient: 30 minutes  Musculoskeletal: Strength & Muscle Tone: within normal limits Gait & Station: normal Patient leans: N/A  Psychiatric Specialty Exam: Physical Exam  Constitutional: He is oriented to person, place, and time. He appears well-developed and well-nourished.  HENT:  Head: Normocephalic.  Respiratory: Effort normal.  Musculoskeletal: Normal range of motion.  Neurological: He is alert and oriented to person, place, and time.  Psychiatric: He has a normal mood and affect. His speech is normal and behavior is normal. Thought content normal. Cognition and memory are normal. He expresses impulsivity.   Review of Systems  Psychiatric/Behavioral: Positive for depression. Negative for hallucinations, memory loss, substance abuse and suicidal ideas. The patient is not nervous/anxious and does not have insomnia.   All other systems reviewed and are negative.  Blood pressure 121/71, pulse (!) 105, temperature 97.7 F (36.5 C), temperature source Oral, resp. rate 18, SpO2 98 %.There is no height or weight on file to calculate BMI. General Appearance: Casual Eye Contact:  Good Speech:  Clear and Coherent and Normal Rate Volume:  Normal Mood:  Euthymic Affect:  Congruent Thought Process:  Coherent Orientation:  Full (Time, Place, and Person) Thought Content:  Logical Suicidal Thoughts:  No Homicidal Thoughts:  No Memory:  Immediate;   Good Recent;   Fair Remote;   Fair Judgement:  Impaired Insight:  Shallow Psychomotor Activity:  Normal Concentration:   Concentration: Good and Attention Span: Good Recall:  Good Fund of Knowledge:  Good Language:  Good Akathisia:  No Handed:  Right AIMS (if indicated):    Assets:  Architect Housing ADL's:  Intact Cognition:  WNL   Mental Status Per Nursing Assessment::   On Admission:   Aggressive and agitated  Demographic Factors:  Male, Adolescent or young adult and Caucasian  Loss Factors: NA  Historical Factors: Impulsivity  Risk Reduction Factors:   Living with another person, especially a relative and Positive social support  Continued Clinical Symptoms:  Bipolar Disorder:   Mixed State  Cognitive Features That Contribute To Risk:  Closed-mindedness    Suicide Risk:  Minimal: No identifiable suicidal ideation.  Patients presenting with no risk factors but with morbid ruminations; may be classified as minimal risk based on the severity of the depressive symptoms    Plan Of Care/Follow-up recommendations:  Activity:  as tolerated Diet:  Heart Healthy  Laveda Abbe, NP 09/29/2017, 11:49 AM

## 2017-09-29 NOTE — Progress Notes (Addendum)
CSW spoke with Theotis Barrio, [phone], with [facility] of Care to notify him patient had good behavior for 24 hours and is psychiatrically cleared. Ladene Artist stated either himself or another staff member will arrive to hospital around 3PM to pick patient up. CSW will notify RN.   CSW left voicemail for patients legal guardian, [name] [phone] with [employer] [place], to notify him of patients discharge. CSW left brief messgae on confidential voicemail due to Mr. Jennette Banker business hours being  Monday-Friday. CSW will instruct group home to follow up with legal guardian if needed.   Stacy Gardner, [facility] Emergency Room Clinical Social Worker [phone]

## 2017-09-29 NOTE — Consult Note (Addendum)
[facility] Face-to-Face Psychiatry Consult   Reason for Consult:  Aggressive behavior Referring Physician:  EDP Patient Identification: Alexander Fritz MRN:  [id number] Principal Diagnosis: Schizoaffective disorder, bipolar type [facility]) Diagnosis:   Patient Active Problem List   Diagnosis Date Noted  . Auditory hallucinations [R44.0] 01/31/2017  . Schizoaffective disorder, bipolar type (HCC) [F25.0] 10/05/2016  . Autism spectrum disorder [F84.0] 08/26/2016    Total Time spent with patient: 45 minutes  Subjective:   Alexander Fritz is a 27 y.o. male patient admitted with aggressive behavior.  HPI:  Pt was seen and chart reviewed with treatment team and Dr Jannifer Franklin. Pt denies suicidal/homicidal ideation, denies auditory/visual hallucinations and does not appear to be responding to internal stimuli. Pt is clam and cooperative and has received his Haldol injection while in the ED. Pt lives in a group home and had an incident of aggression at his group home due to being three weeks late for his Haldol injection. Pt's UDS and BAL negative. Pt is stable and psychiatrically clear for discharge.   Past Psychiatric History: As above  Risk to Self: None Risk to Others: None Prior Inpatient Therapy: Prior Inpatient Therapy: (unknown) Prior Outpatient Therapy: Prior Outpatient Therapy: (unknown)  Past Medical History:  Past Medical History:  Diagnosis Date  . Autism   . Bipolar 1 disorder (HCC)    History reviewed. No pertinent surgical history. Family History: No family history on file. Family Psychiatric  History: Unknown Social History:  Social History   Substance and Sexual Activity  Alcohol Use No     Social History   Substance and Sexual Activity  Drug Use No    Social History   Socioeconomic History  . Marital status: Single    Spouse name: Not on file  . Number of children: Not on file  . Years of education: Not on file  . Highest education level: Not on file  Occupational History  .  Not on file  Social Needs  . Financial resource strain: Not on file  . Food insecurity:    Worry: Not on file    Inability: Not on file  . Transportation needs:    Medical: Not on file    Non-medical: Not on file  Tobacco Use  . Smoking status: Current Every Day Smoker    Packs/day: 0.50    Years: 5.00    Pack years: 2.50    Types: Cigarettes  . Smokeless tobacco: Former Engineer, water and Sexual Activity  . Alcohol use: No  . Drug use: No  . Sexual activity: Not on file  Lifestyle  . Physical activity:    Days per week: Not on file    Minutes per session: Not on file  . Stress: Not on file  Relationships  . Social connections:    Talks on phone: Not on file    Gets together: Not on file    Attends religious service: Not on file    Active member of club or organization: Not on file    Attends meetings of clubs or organizations: Not on file    Relationship status: Not on file  Other Topics Concern  . Not on file  Social History Narrative  . Not on file   Additional Social History:    Allergies:   Allergies  Allergen Reactions  . Cefaclor Other (See Comments)    Unknown.  Per group home MAR.  Marland Kitchen Lithium Palpitations and Rash    Labs: No results found for this or any  previous visit (from the past 48 hour(s)).  Current Facility-Administered Medications  Medication Dose Route Frequency Provider Last Rate Last Dose  . acetaminophen (TYLENOL) tablet 650 mg  650 mg Oral Q4H PRN Tegeler, Canary Brim, MD      . divalproex (DEPAKOTE) DR tablet 500 mg  500 mg Oral BID Charm Rings, NP   500 mg at 09/29/17 1025  . docusate sodium (COLACE) capsule 100 mg  100 mg Oral q morning - 10a Charm Rings, NP   100 mg at 09/29/17 1025  . haloperidol (HALDOL) tablet 5 mg  5 mg Oral BID Charm Rings, NP   5 mg at 09/29/17 1025  . hydrOXYzine (ATARAX/VISTARIL) tablet 25 mg  25 mg Oral BID PRN Charm Rings, NP   25 mg at 09/28/17 2143  . nicotine (NICODERM CQ - dosed in  mg/24 hours) patch 21 mg  21 mg Transdermal Once Charm Rings, NP   21 mg at 09/28/17 1203  . ondansetron (ZOFRAN) tablet 4 mg  4 mg Oral Q8H PRN Tegeler, Canary Brim, MD      . traZODone (DESYREL) tablet 50 mg  50 mg Oral QHS PRN,MR X 1 Truman Hayward, FNP   50 mg at 09/29/17 0203  . trihexyphenidyl (ARTANE) tablet 5 mg  5 mg Oral BID WC Charm Rings, NP   5 mg at 09/29/17 1610   Current Outpatient Medications  Medication Sig Dispense Refill  . cetirizine (ZYRTEC) 10 MG tablet Take 10 mg by mouth daily. (0800)    . divalproex (DEPAKOTE) 500 MG DR tablet Take 1 tablet (500 mg total) by mouth 2 (two) times daily. 60 tablet 0  . docusate sodium (COLACE) 100 MG capsule Take 100 mg by mouth every morning.     . haloperidol decanoate (HALDOL DECANOATE) 100 MG/ML injection Inject 0.5 mLs (50 mg total) into the muscle every 30 (thirty) days. 0.5 mL 0  . hydrOXYzine (ATARAX/VISTARIL) 25 MG tablet Take 1 tablet (25 mg total) by mouth 2 (two) times daily as needed for anxiety (agitation). 60 tablet 0  . trihexyphenidyl (ARTANE) 5 MG tablet Take 1 tablet (5 mg total) by mouth 2 (two) times daily with breakfast and lunch. (0800 & 2000) 60 tablet 0  . carbamazepine (TEGRETOL) 200 MG tablet Take 1 tablet (200 mg total) by mouth 2 (two) times daily. (0800 & 2000) (Patient not taking: Reported on 10/04/2016) 60 tablet 0  . haloperidol (HALDOL) 1 MG tablet Take 1 tablet (1 mg total) by mouth 2 (two) times daily. (Patient not taking: Reported on 02/11/2017) 60 tablet 0  . risperiDONE (RISPERDAL) 2 MG tablet Take 1 tablet (2 mg total) by mouth 2 (two) times daily. (Patient not taking: Reported on 10/04/2016) 60 tablet 0    Musculoskeletal: Strength & Muscle Tone: within normal limits Gait & Station: normal Patient leans: N/A  Psychiatric Specialty Exam: Physical Exam  Constitutional: He is oriented to person, place, and time. He appears well-developed and well-nourished.  HENT:  Head: Normocephalic.   Respiratory: Effort normal.  Musculoskeletal: Normal range of motion.  Neurological: He is alert and oriented to person, place, and time.  Psychiatric: He has a normal mood and affect. His speech is normal and behavior is normal. Thought content normal. Cognition and memory are normal. He expresses impulsivity.    Review of Systems  Psychiatric/Behavioral: Positive for depression. Negative for hallucinations, memory loss, substance abuse and suicidal ideas. The patient is not nervous/anxious and does not  have insomnia.   All other systems reviewed and are negative.   Blood pressure 121/71, pulse (!) 105, temperature 97.7 F (36.5 C), temperature source Oral, resp. rate 18, SpO2 98 %.There is no height or weight on file to calculate BMI.  General Appearance: Casual  Eye Contact:  Good  Speech:  Clear and Coherent and Normal Rate  Volume:  Normal  Mood:  Euthymic  Affect:  Congruent  Thought Process:  Coherent  Orientation:  Full (Time, Place, and Person)  Thought Content:  Logical  Suicidal Thoughts:  No  Homicidal Thoughts:  No  Memory:  Immediate;   Good Recent;   Fair Remote;   Fair  Judgement:  Impaired  Insight:  Shallow  Psychomotor Activity:  Normal  Concentration:  Concentration: Good and Attention Span: Good  Recall:  Good  Fund of Knowledge:  Good  Language:  Good  Akathisia:  No  Handed:  Right  AIMS (if indicated):     Assets:  Architect Housing  ADL's:  Intact  Cognition:  WNL  Sleep:        Treatment Plan Summary: Plan Schizoaffective disorder, bipolar type (HCC)  Discharge Home Take all medications as prescribed Follow up with outpatient provider already in place  Disposition: No evidence of imminent risk to self or others at present.   Patient does not meet criteria for psychiatric inpatient admission. Supportive therapy provided about ongoing stressors. Discussed crisis plan, support from social network,  calling 911, coming to the Emergency Department, and calling Suicide Hotline.  Laveda Abbe, NP 09/29/2017 11:32 AM  Patient seen face-to-face for psychiatric evaluation, chart reviewed and case discussed with the physician extender and developed treatment plan. Reviewed the information documented and agree with the treatment plan. Thedore Mins, MD

## 2017-11-08 ENCOUNTER — Other Ambulatory Visit: Payer: Self-pay

## 2017-11-08 ENCOUNTER — Emergency Department (HOSPITAL_COMMUNITY)
Admission: EM | Admit: 2017-11-08 | Discharge: 2017-11-09 | Disposition: A | Discharge status: Home / Self Care | Payer: Medicare Other | Attending: Emergency Medicine | Admitting: Emergency Medicine

## 2017-11-08 DIAGNOSIS — F840 Autistic disorder: Secondary | ICD-10-CM | POA: Insufficient documentation

## 2017-11-08 DIAGNOSIS — R441 Visual hallucinations: Secondary | ICD-10-CM | POA: Insufficient documentation

## 2017-11-08 DIAGNOSIS — F17210 Nicotine dependence, cigarettes, uncomplicated: Secondary | ICD-10-CM | POA: Diagnosis not present

## 2017-11-08 DIAGNOSIS — Z79899 Other long term (current) drug therapy: Secondary | ICD-10-CM | POA: Diagnosis not present

## 2017-11-08 DIAGNOSIS — F250 Schizoaffective disorder, bipolar type: Secondary | ICD-10-CM | POA: Diagnosis present

## 2017-11-08 DIAGNOSIS — R440 Auditory hallucinations: Secondary | ICD-10-CM | POA: Diagnosis not present

## 2017-11-08 DIAGNOSIS — R45851 Suicidal ideations: Secondary | ICD-10-CM | POA: Insufficient documentation

## 2017-11-08 LAB — CBC
HEMATOCRIT: 42.8 % (ref 39.0–52.0)
Hemoglobin: 14.8 g/dL (ref 13.0–17.0)
MCH: 30.5 pg (ref 26.0–34.0)
MCHC: 34.6 g/dL (ref 30.0–36.0)
MCV: 88.2 fL (ref 78.0–100.0)
Platelets: 227 10*3/uL (ref 150–400)
RBC: 4.85 MIL/uL (ref 4.22–5.81)
RDW: 12 % (ref 11.5–15.5)
WBC: 8.3 10*3/uL (ref 4.0–10.5)

## 2017-11-08 LAB — COMPREHENSIVE METABOLIC PANEL
ALT: 23 U/L (ref 17–63)
AST: 25 U/L (ref 15–41)
Albumin: 4.4 g/dL (ref 3.5–5.0)
Alkaline Phosphatase: 49 U/L (ref 38–126)
Anion gap: 13 (ref 5–15)
BILIRUBIN TOTAL: 0.7 mg/dL (ref 0.3–1.2)
BUN: 11 mg/dL (ref 6–20)
CHLORIDE: 100 mmol/L — AB (ref 101–111)
CO2: 23 mmol/L (ref 22–32)
CREATININE: 0.83 mg/dL (ref 0.61–1.24)
Calcium: 9.4 mg/dL (ref 8.9–10.3)
GFR calc Af Amer: >60 mL/min (ref >60)
Glucose, Bld: 91 mg/dL (ref 65–99)
Potassium: 3.8 mmol/L (ref 3.5–5.1)
Sodium: 136 mmol/L (ref 135–145)
Total Protein: 7.7 g/dL (ref 6.5–8.1)

## 2017-11-08 LAB — RAPID URINE DRUG SCREEN, HOSP PERFORMED
Amphetamines: NOT DETECTED
Benzodiazepines: NOT DETECTED
COCAINE: NOT DETECTED
Opiates: NOT DETECTED
Tetrahydrocannabinol: NOT DETECTED

## 2017-11-08 LAB — ETHANOL

## 2017-11-08 MED ORDER — TRIHEXYPHENIDYL HCL 5 MG PO TABS
5.0000 mg | ORAL_TABLET | Freq: Two times a day (BID) | ORAL | Status: DC
  Administered 2017-11-09 (×2): 5 mg via ORAL
  Filled 2017-11-08 (×2): qty 1

## 2017-11-08 MED ORDER — LORATADINE 10 MG PO TABS
10.0000 mg | ORAL_TABLET | Freq: Every day | ORAL | Status: DC
  Administered 2017-11-09: 10 mg via ORAL
  Filled 2017-11-08: qty 1

## 2017-11-08 MED ORDER — CARBAMAZEPINE 200 MG PO TABS
200.0000 mg | ORAL_TABLET | Freq: Two times a day (BID) | ORAL | Status: DC
  Administered 2017-11-08 – 2017-11-09 (×2): 200 mg via ORAL
  Filled 2017-11-08 (×2): qty 1

## 2017-11-08 MED ORDER — HYDROXYZINE HCL 25 MG PO TABS
25.0000 mg | ORAL_TABLET | Freq: Two times a day (BID) | ORAL | Status: DC | PRN
  Administered 2017-11-08: 25 mg via ORAL
  Filled 2017-11-08: qty 1

## 2017-11-08 MED ORDER — DOCUSATE SODIUM 100 MG PO CAPS
100.0000 mg | ORAL_CAPSULE | Freq: Every morning | ORAL | Status: DC
  Administered 2017-11-09: 100 mg via ORAL
  Filled 2017-11-08: qty 1

## 2017-11-08 MED ORDER — DIVALPROEX SODIUM 500 MG PO DR TAB
500.0000 mg | DELAYED_RELEASE_TABLET | Freq: Two times a day (BID) | ORAL | Status: DC
  Administered 2017-11-08 – 2017-11-09 (×2): 500 mg via ORAL
  Filled 2017-11-08 (×2): qty 1

## 2017-11-08 NOTE — ED Triage Notes (Signed)
Patient complains of AH/VH for 2 months. Patient reports seeing blue flashes and spirits. Patient unable to understand what voices are saying. Patient denies SI/HI at this time.

## 2017-11-08 NOTE — ED Notes (Signed)
Patient denies SI/HI but reports AVH x 2 months. Patient reports that he sees blue flashes and spirits and hears voices and unable to make out what the voices are saying. Encouragement and support provided and safety maintain.

## 2017-11-08 NOTE — ED Notes (Signed)
[id number] Expected date:  Expected time:  Means of arrival:  Comments: Hold 28
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
## 2017-11-08 NOTE — ED Provider Notes (Signed)
[facility]-EMERGENCY DEPT Provider Note   CSN: [id number] Arrival date & time: 11/08/17  1912     History   Chief Complaint Chief Complaint  Patient presents with  . Medical Clearance    hallucinations    HPI Alexander Fritz is a 27 y.o. male with history of autism, schizoaffective disorder, bipolar 1 disorder presents for evaluation of acute onset of auditory and visual hallucinations for 2 months.  Per triage note, patient reports that he sees blue flashes and spirits and hears voices but is unable to make out what the voices are saying.  On my assessment, he is very slow to answer questions, asks me to repeat myself frequently and tells me this is because it is difficult for him to hear what I am saying above the voices in the background.  Denies any other complaints.  Denies recreational drug use.  States he is compliant with his Depakote but has not been taking any of his other medications because "I was not sure if they wanted me to take them or not ".  He told triage nurses that he denied homicidal or suicidal ideation but he tells me that he does sometimes feel suicidal.  When asked about a plan he states "I have too many ".  The history is provided by the patient.    Past Medical History:  Diagnosis Date  . Autism   . Bipolar 1 disorder [facility](HCC)     Patient Active Problem List   Diagnosis Date Noted  . Auditory hallucinations 01/31/2017  . Schizoaffective disorder, bipolar type (HCC) 10/05/2016  . Autism spectrum disorder 08/26/2016    No past surgical history on file.      Home Medications    Prior to Admission medications   Medication Sig Start Date End Date Taking? Authorizing Provider  cetirizine (ZYRTEC) 10 MG tablet Take 10 mg by mouth daily. (0800)   Yes [provider]  divalproex (DEPAKOTE) 500 MG DR tablet Take 1 tablet (500 mg total) by mouth 2 (two) times daily. 11/25/16  Yes Charlynne PanderYao, David Hsienta, MD  docusate sodium (COLACE) 100  MG capsule Take 100 mg by mouth every morning.    Yes [provider]  haloperidol decanoate (HALDOL DECANOATE) 100 MG/ML injection Inject 0.5 mLs (50 mg total) into the muscle every 30 (thirty) days. 12/23/16  Yes Charm RingsLord, Jamison Y, NP  hydrOXYzine (ATARAX/VISTARIL) 25 MG tablet Take 1 tablet (25 mg total) by mouth 2 (two) times daily as needed for anxiety (agitation). 11/23/16  Yes Charm RingsLord, Jamison Y, NP  trihexyphenidyl (ARTANE) 5 MG tablet Take 1 tablet (5 mg total) by mouth 2 (two) times daily with breakfast and lunch. (0800 & 2000) 11/23/16  Yes Charm RingsLord, Jamison Y, NP  carbamazepine (TEGRETOL) 200 MG tablet Take 1 tablet (200 mg total) by mouth 2 (two) times daily. (0800 & 2000) Patient not taking: Reported on 10/04/2016 09/18/16   Charm RingsLord, Jamison Y, NP  haloperidol (HALDOL) 1 MG tablet Take 1 tablet (1 mg total) by mouth 2 (two) times daily. Patient not taking: Reported on 02/11/2017 11/23/16   Charm RingsLord, Jamison Y, NP  risperiDONE (RISPERDAL) 2 MG tablet Take 1 tablet (2 mg total) by mouth 2 (two) times daily. Patient not taking: Reported on 10/04/2016 09/18/16   Charm RingsLord, Jamison Y, NP    Family History No family history on file.  Social History Social History   Tobacco Use  . Smoking status: Current Every Day Smoker    Packs/day: 0.50  Years: 5.00    Pack years: 2.50    Types: Cigarettes  . Smokeless tobacco: Former Engineer, water Use Topics  . Alcohol use: No  . Drug use: No     Allergies   Cefaclor and Lithium   Review of Systems Review of Systems  Constitutional: Negative for chills and fever.  Psychiatric/Behavioral: Positive for hallucinations and suicidal ideas.  All other systems reviewed and are negative.    Physical Exam Updated Vital Signs BP 116/69 (BP Location: Right Arm)   Pulse 60   Temp 97.8 F (36.6 C) (Oral)   Resp 17   Ht 5\' 8"  (1.727 m)   Wt 68.5 kg (151 lb)   SpO2 96%   BMI 22.96 kg/m   Physical Exam  Constitutional: He appears well-developed and  well-nourished. No distress.  HENT:  Head: Normocephalic and atraumatic.  Eyes: Conjunctivae are normal. Right eye exhibits no discharge. Left eye exhibits no discharge.  Neck: No JVD present. No tracheal deviation present.  Cardiovascular: Normal rate, regular rhythm and normal heart sounds.  Pulmonary/Chest: Effort normal and breath sounds normal.  Abdominal: Soft. Bowel sounds are normal. He exhibits no distension. There is no tenderness. There is no guarding.  Musculoskeletal: He exhibits no edema.  Neurological: He is alert.  Extremely slow to answer questions, repeats what I say frequently.  Asks me to repeat my questions frequently.  Does not follow all commands.  Tremulous upper extremities at rest.  Skin: Skin is warm and dry. No erythema.  Psychiatric: He is actively hallucinating. He expresses suicidal ideation. He expresses no homicidal ideation. He expresses suicidal plans. He expresses no homicidal plans.  Appears to be responding to internal stimuli at this time, sometimes inappropriately smiles and laughs during assessment He is inattentive.  Nursing note and vitals reviewed.    ED Treatments / Results  Labs (all labs ordered are listed, but only abnormal results are displayed) Labs Reviewed  COMPREHENSIVE METABOLIC PANEL - Abnormal; Notable for the following components:      Result Value   Chloride 100 (*)    All other components within normal limits  RAPID URINE DRUG SCREEN, HOSP PERFORMED - Abnormal; Notable for the following components:   Barbiturates   (*)    Value: Result not available. Reagent lot number recalled by manufacturer.   All other components within normal limits  ETHANOL  CBC    EKG None  Radiology No results found.  Procedures Procedures (including critical care time)  Medications Ordered in ED Medications  carbamazepine (TEGRETOL) tablet 200 mg (200 mg Oral Given 11/08/17 2348)  loratadine (CLARITIN) tablet 10 mg (has no administration  in time range)  divalproex (DEPAKOTE) DR tablet 500 mg (500 mg Oral Given 11/08/17 2348)  docusate sodium (COLACE) capsule 100 mg (has no administration in time range)  hydrOXYzine (ATARAX/VISTARIL) tablet 25 mg (25 mg Oral Given 11/08/17 2348)  trihexyphenidyl (ARTANE) tablet 5 mg (has no administration in time range)     Initial Impression / Assessment and Plan / ED Course  I have reviewed the triage vital signs and the nursing notes.  Pertinent labs & imaging results that were available during my care of the patient were reviewed by me and considered in my medical decision making (see chart for details).     Patient presents for evaluation of auditory and visual hallucinations as well as suicidal ideation.  He is afebrile, vital signs are stable.  He is nontoxic in appearance.  He is emotionally labile,  very slow to answer questions.  Does not follow some commands and does not answer some questions.  Appears to be responding to internal stimuli at this time.  Lab work and UDS reviewed by me.  Physical examination reassuring.  He is medically clear for TTS evaluation at this time.  TTS does recommend overnight observation and a.m. psych evaluation.  Of note, patient is here voluntarily and may require IVC if he attempts to leave prior to further assessment.  Final Clinical Impressions(s) / ED Diagnoses   Final diagnoses:  Auditory hallucination  Visual hallucination  Suicidal ideation    ED Discharge Orders    None       Bennye Alm 11/09/17 0009    Eber Hong, MD 11/10/17 1753

## 2017-11-09 DIAGNOSIS — R440 Auditory hallucinations: Principal | ICD-10-CM

## 2017-11-09 DIAGNOSIS — R45851 Suicidal ideations: Secondary | ICD-10-CM | POA: Diagnosis not present

## 2017-11-09 DIAGNOSIS — F250 Schizoaffective disorder, bipolar type: Secondary | ICD-10-CM | POA: Diagnosis not present

## 2017-11-09 DIAGNOSIS — R441 Visual hallucinations: Secondary | ICD-10-CM | POA: Diagnosis not present

## 2017-11-09 MED ORDER — OLANZAPINE 5 MG PO TABS: 5.0000 mg | ORAL_TABLET | Freq: Every day | ORAL | Status: DC

## 2017-11-09 NOTE — Progress Notes (Signed)
CSW spoke with Lad[facility] with [facility]- representative will be able to pick patient up around 3PM this afternoon. CSW also left voicemail with patients legal guardian, Cathlean CowerVince McKnight [phone], who is with [facility] notifying him [facility].   Stacy GardnerErin Jahbari Repinski, LCSW Emergency Room Clinical Social Worker [phone]

## 2017-11-09 NOTE — Consult Note (Addendum)
[facility] Psychiatry Consult   Reason for Consult:  Auditory hallucinations Referring Physician:  EDP Patient Identification: Alexander Fritz MRN:  [id number] Principal Diagnosis: Schizoaffective disorder, bipolar type [facility]) Diagnosis:   Patient Active Problem List   Diagnosis Date Noted  . Suicidal ideation [R45.851]   . Visual hallucination [R44.1]   . Auditory hallucination [R44.0] 01/31/2017  . Schizoaffective disorder, bipolar type ([place]) [F25.0] 10/05/2016  . Autism spectrum disorder [F84.0] 08/26/2016    Total Time spent with patient: 45 minutes  Subjective:   Alexander Fritz is a 27 y.o. male patient admitted with auditory hallucinations.  HPI:  Pt was seen and chart reviewed with treatment team and Dr Darleene Cleaver. Pt denies suicidal/homicidal ideation, denies auditory/visual hallucinations and does not appear to be responding to internal stimuli. Pt stated he was hearing voices but can't make out what they are saying. Pt stated he feels better today and is able to contract for safety upon discharge. Pt's UDS and BAL are negative. Pt lives in a group home and is stable and psychiatrically clear for discharge.   Past Psychiatric History: As above  Risk to Self: None Risk to Others: None Prior Inpatient Therapy: Prior Inpatient Therapy: ([facility]) Prior Outpatient Therapy: Prior Outpatient Therapy: [facility])  Past Medical History:  Past Medical History:  Diagnosis Date  . Autism   . Bipolar 1 disorder ([place])    No past surgical history on file. Family History: No family history on file. Family Psychiatric  History: Unknown Social History:  Social History   Substance and Sexual Activity  Alcohol Use No     Social History   Substance and Sexual Activity  Drug Use No    Social History   Socioeconomic History  . Marital status: Single    Spouse name: Not on file  . Number of children: Not on file  . Years of education: Not on file  . Highest education level: Not on file   Occupational History  . Not on file  Social Needs  . Financial resource strain: Not on file  . Food insecurity:    Worry: Not on file    Inability: Not on file  . Transportation needs:    Medical: Not on file    Non-medical: Not on file  Tobacco Use  . Smoking status: Current Every Day Smoker    Packs/day: 0.50    Years: 5.00    Pack years: 2.50    Types: Cigarettes  . Smokeless tobacco: Former Network engineer and Sexual Activity  . Alcohol use: No  . Drug use: No  . Sexual activity: Not on file  Lifestyle  . Physical activity:    Days per week: Not on file    Minutes per session: Not on file  . Stress: Not on file  Relationships  . Social connections:    Talks on phone: Not on file    Gets together: Not on file    Attends religious service: Not on file    Active member of club or organization: Not on file    Attends meetings of clubs or organizations: Not on file    Relationship status: Not on file  Other Topics Concern  . Not on file  Social History Narrative  . Not on file   Additional Social History:    Allergies:   Allergies  Allergen Reactions  . Cefaclor Other (See Comments)    Unknown.  Per group home MAR.  Marland Kitchen Lithium Palpitations and Rash    Labs:  Results for orders placed or performed during the hospital encounter of 11/08/17 (from the past 48 hour(s))  Rapid urine drug screen (hospital performed)     Status: Abnormal   Collection Time: 11/08/17  8:17 PM  Result Value Ref Range   Opiates NONE DETECTED NONE DETECTED   Cocaine NONE DETECTED NONE DETECTED   Benzodiazepines NONE DETECTED NONE DETECTED   Amphetamines NONE DETECTED NONE DETECTED   Tetrahydrocannabinol NONE DETECTED NONE DETECTED   Barbiturates (A) NONE DETECTED    Result not available. Reagent lot number recalled by manufacturer.    Comment: Performed at [facility], [place] [address], [place], [place] [address]  Comprehensive metabolic panel     Status: Abnormal    Collection Time: 11/08/17  8:19 PM  Result Value Ref Range   Sodium 136 135 - 145 mmol/L   Potassium 3.8 3.5 - 5.1 mmol/L   Chloride 100 (L) 101 - 111 mmol/L   CO2 23 22 - 32 mmol/L   Glucose, Bld 91 65 - 99 mg/dL   BUN 11 6 - 20 mg/dL   Creatinine, Ser 0.83 0.61 - 1.24 mg/dL   Calcium 9.4 8.9 - 10.3 mg/dL   Total Protein 7.7 6.5 - 8.1 g/dL   Albumin 4.4 3.5 - 5.0 g/dL   AST 25 15 - 41 U/L   ALT 23 17 - 63 U/L   Alkaline Phosphatase 49 38 - 126 U/L   Total Bilirubin 0.7 0.3 - 1.2 mg/dL   GFR calc non Af Amer >60 >60 mL/min   GFR calc Af Amer >60 >60 mL/min    Comment: (NOTE) The eGFR has been calculated using the CKD EPI equation. This calculation has not been validated in all clinical situations. eGFR's persistently <60 mL/min signify possible Chronic Kidney Disease.    Anion gap 13 5 - 15    Comment: Performed at [facility], [place] [address] [place], [place] [address]  Ethanol     Status: None   Collection Time: 11/08/17  8:19 PM  Result Value Ref Range   Alcohol, Ethyl (B) <10 <10 mg/dL    Comment: (NOTE) Lowest detectable limit for serum alcohol is 10 mg/dL. For medical purposes only. Performed at [facility], [place] [address], [place], [place] [address]   cbc     Status: None   Collection Time: 11/08/17  8:19 PM  Result Value Ref Range   WBC 8.3 4.0 - 10.5 K/uL   RBC 4.85 4.22 - 5.81 MIL/uL   Hemoglobin 14.8 13.0 - 17.0 g/dL   HCT 42.8 39.0 - 52.0 %   MCV 88.2 78.0 - 100.0 fL   MCH 30.5 26.0 - 34.0 pg   MCHC 34.6 30.0 - 36.0 g/dL   RDW 12.0 11.5 - 15.5 %   Platelets 227 150 - 400 K/uL    Comment: Performed at [facility], [place] [address], [place], [place] [address]    Current Facility-Administered Medications  Medication Dose Route Frequency Provider Last Rate Last Dose  . carbamazepine (TEGRETOL) tablet 200 mg  200 mg Oral BID Nils Flack, Mina A, PA-C   200 mg at 11/09/17 0924  . divalproex (DEPAKOTE)  DR tablet 500 mg  500 mg Oral BID Nils Flack, Mina A, PA-C   500 mg at 11/09/17 0924  . docusate sodium (COLACE) capsule 100 mg  100 mg Oral q morning - 10a Fawze, Mina A, PA-C   100 mg at 11/09/17 2297  . hydrOXYzine (ATARAX/VISTARIL)  tablet 25 mg  25 mg Oral BID PRN Rodell Perna A, PA-C   25 mg at 11/08/17 2348  . loratadine (CLARITIN) tablet 10 mg  10 mg Oral Daily Fawze, Mina A, PA-C   10 mg at 11/09/17 0924  . OLANZapine (ZYPREXA) tablet 5 mg  5 mg Oral QHS Jisel Fleet, MD      . trihexyphenidyl (ARTANE) tablet 5 mg  5 mg Oral BID WC Fawze, Mina A, PA-C   5 mg at 11/09/17 1139   Current Outpatient Medications  Medication Sig Dispense Refill  . cetirizine (ZYRTEC) 10 MG tablet Take 10 mg by mouth daily. (0800)    . divalproex (DEPAKOTE) 500 MG DR tablet Take 1 tablet (500 mg total) by mouth 2 (two) times daily. 60 tablet 0  . docusate sodium (COLACE) 100 MG capsule Take 100 mg by mouth every morning.     . haloperidol decanoate (HALDOL DECANOATE) 100 MG/ML injection Inject 0.5 mLs (50 mg total) into the muscle every 30 (thirty) days. 0.5 mL 0  . hydrOXYzine (ATARAX/VISTARIL) 25 MG tablet Take 1 tablet (25 mg total) by mouth 2 (two) times daily as needed for anxiety (agitation). 60 tablet 0  . trihexyphenidyl (ARTANE) 5 MG tablet Take 1 tablet (5 mg total) by mouth 2 (two) times daily with breakfast and lunch. (0800 & 2000) 60 tablet 0  . carbamazepine (TEGRETOL) 200 MG tablet Take 1 tablet (200 mg total) by mouth 2 (two) times daily. (0800 & 2000) (Patient not taking: Reported on 10/04/2016) 60 tablet 0  . haloperidol (HALDOL) 1 MG tablet Take 1 tablet (1 mg total) by mouth 2 (two) times daily. (Patient not taking: Reported on 02/11/2017) 60 tablet 0  . risperiDONE (RISPERDAL) 2 MG tablet Take 1 tablet (2 mg total) by mouth 2 (two) times daily. (Patient not taking: Reported on 10/04/2016) 60 tablet 0    Musculoskeletal: Strength & Muscle Tone: within normal limits Gait & Station:  normal Patient leans: N/A  Psychiatric Specialty Exam: Physical Exam  Constitutional: He is oriented to person, place, and time. He appears well-developed and well-nourished.  HENT:  Head: Normocephalic.  Respiratory: Effort normal.  Musculoskeletal: Normal range of motion.  Neurological: He is alert and oriented to person, place, and time.  Psychiatric: His speech is normal and behavior is normal. Thought content normal. His mood appears anxious. Cognition and memory are normal. He expresses impulsivity.    Review of Systems  Psychiatric/Behavioral: Positive for depression. Negative for hallucinations, memory loss, substance abuse and suicidal ideas. The patient is nervous/anxious. The patient does not have insomnia.   All other systems reviewed and are negative.   Blood pressure 124/64, pulse 65, temperature 98.4 F (36.9 C), temperature source Oral, resp. rate 18, height 5' 8" (1.727 m), weight 151 lb (68.5 kg), SpO2 99 %.Body mass index is 22.96 kg/m.  General Appearance: Casual  Eye Contact:  Good  Speech:  Clear and Coherent and Normal Rate  Volume:  Normal  Mood:  Depressed  Affect:  Congruent  Thought Process:  Coherent and Linear  Orientation:  Full (Time, Place, and Person)  Thought Content:  Logical  Suicidal Thoughts:  No  Homicidal Thoughts:  No  Memory:  Immediate;   Good Recent;   Good Remote;   Fair  Judgement:  Fair  Insight:  Lacking  Psychomotor Activity:  Normal  Concentration:  Concentration: Good and Attention Span: Good  Recall:  Good  Fund of Knowledge:  Good  Language:  Good  Akathisia:  No  Handed:  Right  AIMS (if indicated):     Assets:  Agricultural consultant Housing Social Support  ADL's:  Intact  Cognition:  WNL  Sleep:   Good     Treatment Plan Summary: Plan Schizoaffective disorder, bipolar type ([place])  Discharge home Take all medications as prescribed   Disposition: No evidence of imminent risk to  self or others at present.   Patient does not meet criteria for psychiatric inpatient admission. Supportive therapy provided about ongoing stressors. Discussed crisis plan, support from social network, calling 911, coming to the Emergency Department, and calling Suicide Hotline.  Ethelene Hal, NP 11/09/2017 11:58 AM  Patient seen face-to-face for psychiatric evaluation, chart reviewed and case discussed with the physician extender and developed treatment plan. Reviewed the information documented and agree with the treatment plan. Corena Pilgrim, MD

## 2017-11-09 NOTE — [facility] Suicide Risk Assessment (Cosign Needed)
Suicide Risk Assessment  Discharge Assessment   [facility]   Principal Problem: Schizoaffective disorder, bipolar type [facility](HCC) Discharge Diagnoses:  Patient Active Problem List   Diagnosis Date Noted  . Suicidal ideation [R45.851]   . Visual hallucination [R44.1]   . Auditory hallucination [R44.0] 01/31/2017  . Schizoaffective disorder, bipolar type (HCC) [F25.0] 10/05/2016  . Autism spectrum disorder [F84.0] 08/26/2016    Total Time spent with patient: 45 minutes  Musculoskeletal: Strength & Muscle Tone: within normal limits Gait & Station: normal Patient leans: N/A  Psychiatric Specialty Exam: Physical Exam  Constitutional: He is oriented to person, place, and time. He appears well-developed and well-nourished.  HENT:  Head: Normocephalic.  Respiratory: Effort normal.  Musculoskeletal: Normal range of motion.  Neurological: He is alert and oriented to person, place, and time.  Psychiatric: His speech is normal and behavior is normal. Thought content normal. His mood appears anxious. Cognition and memory are normal. He expresses impulsivity.   Review of Systems  Psychiatric/Behavioral: Positive for depression. Negative for hallucinations, memory loss, substance abuse and suicidal ideas. The patient is nervous/anxious. The patient does not have insomnia.   All other systems reviewed and are negative.  Blood pressure 124/64, pulse 65, temperature 98.4 F (36.9 C), temperature source Oral, resp. rate 18, height 5\' 8"  (1.727 m), weight 151 lb (68.5 kg), SpO2 99 %.Body mass index is 22.96 kg/m. General Appearance: Casual Eye Contact:  Good Speech:  Clear and Coherent and Normal Rate Volume:  Normal Mood:  Depressed Affect:  Congruent Thought Process:  Coherent and Linear Orientation:  Full (Time, Place, and Person) Thought Content:  Logical Suicidal Thoughts:  No Homicidal Thoughts:  No Memory:  Immediate;   Good Recent;   Good Remote;    Fair Judgement:  Fair Insight:  Lacking Psychomotor Activity:  Normal Concentration:  Concentration: Good and Attention Span: Good Recall:  Good Fund of Knowledge:  Good Language:  Good Akathisia:  No Handed:  Right AIMS (if indicated):    Assets:  ArchitectCommunication Skills Financial Resources/Insurance Housing Social Support ADL's:  Intact Cognition:  WNL Sleep:   Good   Mental Status Per Nursing Assessment::   On Admission:   Auditory hallucinations  Demographic Factors:  Male, Adolescent or young adult and Caucasian  Loss Factors: Financial problems/change in socioeconomic status  Historical Factors: Impulsivity  Risk Reduction Factors:   Living with another person, especially a relative  Continued Clinical Symptoms:  Depression:   Impulsivity  Cognitive Features That Contribute To Risk:  Closed-mindedness    Suicide Risk:  Minimal: No identifiable suicidal ideation.  Patients presenting with no risk factors but with morbid ruminations; may be classified as minimal risk based on the severity of the depressive symptoms    Plan Of Care/Follow-up recommendations:  Activity:  as tolerated Diet:  Heart healthy  Laveda AbbeLaurie Britton Parks, NP 11/09/2017, 12:04 PM

## 2017-11-09 NOTE — BH Assessment (Signed)
[facility] Assessment Progress Note  Per Thedore MinsMojeed Akintayo, MD, this pt does not require psychiatric hospitalization at this time.  [facility]  No discharge referrals are required.  Stacy GardnerErin Davenport, LCSW, agrees to facilitate return to the community.  Pt's nurse, Morrie Sheldonshley, has been notified.  Doylene Canninghomas Mahreen Schewe, MA Triage Specialist [phone]

## 2017-11-09 NOTE — ED Notes (Signed)
Pt compliant with medication regimen. Reports he "feels good" Denies SI/HI/AVH. Encouragement and support provided. Special checks q 15 mins in place for safety, Video monitoring in place. Will continue to monitor.

## 2017-11-09 NOTE — ED Notes (Signed)
Pt taking a shower 

## 2017-11-09 NOTE — Progress Notes (Signed)
CSW called and spoke to Derrek [name] with [facility] who stated group home personnel were in route to p/u the pt.  CSW verified that Darrel Hoovernton Porter is picking the pt up [facility].  RN updated.  CSW will continue to follow for D/C needs.  Dorothe PeaJonathan F. Abisai Coble, LCSW, LCAS, CSI Clinical Social Worker Ph: [phone]

## 2017-11-09 NOTE — ED Notes (Signed)
Pt d/c home to group home per MD order. Discharge summary reviewed , pt verbalizes understanding. Pt denies SI/HI/AVH. Personal property returned to pt. Pt signed e-signature. Ambulatory off unit with [facility].

## 2018-02-16 ENCOUNTER — Emergency Department (HOSPITAL_COMMUNITY)
Admission: EM | Admit: 2018-02-16 | Discharge: 2018-02-16 | Disposition: A | Discharge status: Home / Self Care | Payer: Medicare Other | Attending: Emergency Medicine | Admitting: Emergency Medicine

## 2018-02-16 ENCOUNTER — Encounter (HOSPITAL_COMMUNITY): Payer: Self-pay

## 2018-02-16 ENCOUNTER — Other Ambulatory Visit: Payer: Self-pay

## 2018-02-16 DIAGNOSIS — F17210 Nicotine dependence, cigarettes, uncomplicated: Secondary | ICD-10-CM | POA: Diagnosis not present

## 2018-02-16 DIAGNOSIS — Z79899 Other long term (current) drug therapy: Secondary | ICD-10-CM | POA: Diagnosis not present

## 2018-02-16 DIAGNOSIS — F250 Schizoaffective disorder, bipolar type: Secondary | ICD-10-CM | POA: Diagnosis not present

## 2018-02-16 DIAGNOSIS — Z0000 Encounter for general adult medical examination without abnormal findings: Secondary | ICD-10-CM | POA: Diagnosis present

## 2018-02-16 DIAGNOSIS — F840 Autistic disorder: Secondary | ICD-10-CM | POA: Insufficient documentation

## 2018-02-16 MED ORDER — DIVALPROEX SODIUM 500 MG PO DR TAB
500.0000 mg | DELAYED_RELEASE_TABLET | Freq: Two times a day (BID) | ORAL | Status: DC
  Administered 2018-02-16 (×2): 500 mg via ORAL
  Filled 2018-02-16 (×2): qty 1

## 2018-02-16 MED ORDER — HALOPERIDOL 1 MG PO TABS
1.0000 mg | ORAL_TABLET | Freq: Two times a day (BID) | ORAL | Status: DC
  Administered 2018-02-16: 1 mg via ORAL
  Filled 2018-02-16 (×2): qty 1

## 2018-02-16 MED ORDER — HALOPERIDOL DECANOATE 100 MG/ML IM SOLN
50.0000 mg | Freq: Once | INTRAMUSCULAR | Status: AC
  Administered 2018-02-16: 50 mg via INTRAMUSCULAR
  Filled 2018-02-16: qty 0.5

## 2018-02-16 MED ORDER — TRIHEXYPHENIDYL HCL 5 MG PO TABS: 5.0000 mg | ORAL_TABLET | Freq: Two times a day (BID) | ORAL | Status: DC

## 2018-02-16 MED ORDER — HYDROXYZINE HCL 25 MG PO TABS: 25.0000 mg | ORAL_TABLET | Freq: Two times a day (BID) | ORAL | Status: DC | PRN

## 2018-02-16 NOTE — Progress Notes (Addendum)
CSW made aware patient was discharged from ED and has been waiting in the lobby for several hours. Patient reportedly asking for a ride home.  CSW has not received a returned call from [facility] [phone]). CSW left voicemail at 11am after speaking with Ladene Artist at 10am.  CSW called Ladene Artist again, Data processing manager.  CSW contacted patient's legal guardian through [facility] [phone]). CSW advised staff to request patient remain in lobby and not leave campus without caregiver.  CSW continuing to follow, legal guardian states they will call back CSW.  - UPDATE:  CSW spoke with Ladene Artist. Derrick aware of patient's readiness for discharge and will contact group home staff to pick up patient from lobby immediately. Derrick Warden/ranger are "20 minutes" from the hospital and should arrive shortly.   Enid Cutter, LCSW-A Clinical Social Worker [phone]

## 2018-02-16 NOTE — ED Notes (Signed)
Left message for [facility] of Care in [place] that the patient is being d/c.

## 2018-02-16 NOTE — ED Notes (Signed)
Alexander Fritz with [facility] [place] (pt legal guardian) made aware that he is here.

## 2018-02-16 NOTE — ED Provider Notes (Signed)
[facility]-EMERGENCY DEPT Provider Note   CSN: [id number] Arrival date & time: 02/16/18  0010     History   Chief Complaint Chief Complaint  Patient presents with  . Ran Away from Group Home   Level 5 caveat due to psychiatric disorder HPI Alexander Fritz is a 27 y.o. male.  HPI Patient presents because he ran away from his group home.  He tells me not entirely sure why he ran away.  He denies any SI.  He told nursing staff he'd like to stay here for the weekend.  He asked me if he had to see a psychiatrist. Patient denies feeling unsafe at the group home.  He has no other complaints Past Medical History:  Diagnosis Date  . Autism   . Bipolar 1 disorder [facility])     Patient Active Problem List   Diagnosis Date Noted  . Suicidal ideation   . Visual hallucination   . Auditory hallucination 01/31/2017  . Schizoaffective disorder, bipolar type (HCC) 10/05/2016  . Autism spectrum disorder 08/26/2016    History reviewed. No pertinent surgical history.      Home Medications    Prior to Admission medications   Medication Sig Start Date End Date Taking? Authorizing Provider  carbamazepine (TEGRETOL) 200 MG tablet Take 1 tablet (200 mg total) by mouth 2 (two) times daily. (0800 & 2000) Patient not taking: Reported on 10/04/2016 09/18/16   Charm Rings, NP  cetirizine (ZYRTEC) 10 MG tablet Take 10 mg by mouth daily. (0800)    [provider]  divalproex (DEPAKOTE) 500 MG DR tablet Take 1 tablet (500 mg total) by mouth 2 (two) times daily. 11/25/16   Charlynne Pander, MD  docusate sodium (COLACE) 100 MG capsule Take 100 mg by mouth every morning.     [provider]  haloperidol (HALDOL) 1 MG tablet Take 1 tablet (1 mg total) by mouth 2 (two) times daily. Patient not taking: Reported on 02/11/2017 11/23/16   Charm Rings, NP  haloperidol decanoate (HALDOL DECANOATE) 100 MG/ML injection Inject 0.5 mLs (50 mg total) into the muscle every 30  (thirty) days. 12/23/16   Charm Rings, NP  hydrOXYzine (ATARAX/VISTARIL) 25 MG tablet Take 1 tablet (25 mg total) by mouth 2 (two) times daily as needed for anxiety (agitation). 11/23/16   Charm Rings, NP  risperiDONE (RISPERDAL) 2 MG tablet Take 1 tablet (2 mg total) by mouth 2 (two) times daily. Patient not taking: Reported on 10/04/2016 09/18/16   Charm Rings, NP  trihexyphenidyl (ARTANE) 5 MG tablet Take 1 tablet (5 mg total) by mouth 2 (two) times daily with breakfast and lunch. (0800 & 2000) 11/23/16   Charm Rings, NP    Family History History reviewed. No pertinent family history.  Social History Social History   Tobacco Use  . Smoking status: Current Every Day Smoker    Packs/day: 0.50    Years: 5.00    Pack years: 2.50    Types: Cigarettes  . Smokeless tobacco: Former Engineer, water Use Topics  . Alcohol use: No  . Drug use: No     Allergies   Cefaclor and Lithium   Review of Systems Review of Systems  Unable to perform ROS: Psychiatric disorder     Physical Exam Updated Vital Signs BP 128/72 (BP Location: Right Arm)   Pulse (!) 102   Temp 99.1 F (37.3 C) (Oral)   Resp 16   Ht 1.727 m (5'  8")   Wt 69.9 kg   SpO2 98%   BMI 23.42 kg/m   Physical Exam CONSTITUTIONAL: Well-developed but appears mildly anxious HEAD: Normocephalic/atraumatic EYES: EOMI ENMT: Mucous membranes moist NECK: supple no meningeal signs CV: S1/S2 noted, no murmurs/rubs/gallops noted LUNGS: Lungs are clear to auscultation bilaterally, no apparent distress ABDOMEN: soft NEURO: Pt is awake/alert, moves all extremitiesx4.  No facial droop.  Patient rocking back and forth in his chair. EXTREMITIES: pulses normal/equal, full ROM SKIN: warm, color normal PSYCH: Anxious  ED Treatments / Results  Labs (all labs ordered are listed, but only abnormal results are displayed) Labs Reviewed - No data to display  EKG None  Radiology No results  found.  Procedures Procedures   Medications Ordered in ED Medications - No data to display   Initial Impression / Assessment and Plan / ED Course  I have reviewed the triage vital signs and the nursing notes.      Patient Presents because he ran away from his group home.  He is unable to tell me now why he ran away. He is in no distress this time.  Nursing has called his group home, but no one answered.  6:37 AM Patient resting comfortably.  Awaiting callback from group home so that he can be discharged Final Clinical Impressions(s) / ED Diagnoses   Final diagnoses:  Autism    ED Discharge Orders    None       Zadie Rhine, MD 02/16/18 [phone]

## 2018-02-16 NOTE — Progress Notes (Addendum)
CSW reached out to patient's group home, [facility] [phone]). Derrick at [place] states he communicated to [facility] ED staff that the patient is welcome to return to his group home once he receives his Haldol injection.   Per Ladene Artist, the patient will often act aggressively (hitting care providers) and elope when his haldol dose wears off and this is a pattern of behavior. Derrick requested a call once the patient has been given Haldol injection and can have a staff member pick up patient prior to 2pm today.  CSW made EDP aware of caregiver request. Haldol is listed in the patient's home medication list with dosage.   CSW continuing to follow to assist with discharge.   Update 11:02am Left VM with Ladene Artist to make caregivers aware of patient's readiness for discharge and to confirm a pick up time to notify ED nursing staff.   Enid Cutter, LCSW-A Clinical Social Worker [phone]

## 2018-02-16 NOTE — ED Triage Notes (Signed)
Pt reports that he ran away from his group home and is not sure why. He denies SI/HI. He denies AVH. He reports that he wants to stay here for the weekend to see if his meds are working. He reports compliance with his meds. Pt is calm and cooperative in triage.

## 2018-02-16 NOTE — ED Provider Notes (Addendum)
Phone call from group home.  Patient will need his Haldol decanoate 50 mg IM prior to discharge.  This was ordered in the emergency department.   Donnetta Hutching, MD 02/16/18 1001    Donnetta Hutching, MD 02/16/18 1002

## 2018-02-16 NOTE — Discharge Instructions (Addendum)
Julion was given his Haldol injection prior to discharge.

## 2018-02-16 NOTE — ED Notes (Signed)
Bed: WTR5 Expected date:  Expected time:  Means of arrival:  Comments: 

## 2018-03-01 ENCOUNTER — Emergency Department (HOSPITAL_COMMUNITY)
Admission: EM | Admit: 2018-03-01 | Discharge: 2018-03-01 | Disposition: A | Discharge status: Home / Self Care | Payer: Medicare Other | Attending: Emergency Medicine | Admitting: Emergency Medicine

## 2018-03-01 ENCOUNTER — Encounter (HOSPITAL_COMMUNITY): Payer: Self-pay

## 2018-03-01 ENCOUNTER — Other Ambulatory Visit: Payer: Self-pay

## 2018-03-01 DIAGNOSIS — F840 Autistic disorder: Secondary | ICD-10-CM | POA: Insufficient documentation

## 2018-03-01 DIAGNOSIS — R440 Auditory hallucinations: Secondary | ICD-10-CM | POA: Diagnosis present

## 2018-03-01 DIAGNOSIS — Z79899 Other long term (current) drug therapy: Secondary | ICD-10-CM | POA: Insufficient documentation

## 2018-03-01 DIAGNOSIS — F17210 Nicotine dependence, cigarettes, uncomplicated: Secondary | ICD-10-CM | POA: Diagnosis not present

## 2018-03-01 DIAGNOSIS — Z046 Encounter for general psychiatric examination, requested by authority: Secondary | ICD-10-CM | POA: Insufficient documentation

## 2018-03-01 DIAGNOSIS — F329 Major depressive disorder, single episode, unspecified: Secondary | ICD-10-CM | POA: Diagnosis not present

## 2018-03-01 DIAGNOSIS — F250 Schizoaffective disorder, bipolar type: Secondary | ICD-10-CM | POA: Diagnosis not present

## 2018-03-01 DIAGNOSIS — R45851 Suicidal ideations: Secondary | ICD-10-CM | POA: Insufficient documentation

## 2018-03-01 LAB — CBC
HEMATOCRIT: 44.3 % (ref 39.0–52.0)
HEMOGLOBIN: 14.7 g/dL (ref 13.0–17.0)
MCH: 29.9 pg (ref 26.0–34.0)
MCHC: 33.2 g/dL (ref 30.0–36.0)
MCV: 90.2 fL (ref 80.0–100.0)
Platelets: 221 10*3/uL (ref 150–400)
RBC: 4.91 MIL/uL (ref 4.22–5.81)
RDW: 12 % (ref 11.5–15.5)
WBC: 5.2 10*3/uL (ref 4.0–10.5)
nRBC: 0 % (ref 0.0–0.2)

## 2018-03-01 LAB — COMPREHENSIVE METABOLIC PANEL
ALBUMIN: 4.1 g/dL (ref 3.5–5.0)
ALT: 19 U/L (ref 0–44)
ANION GAP: 11 (ref 5–15)
AST: 20 U/L (ref 15–41)
Alkaline Phosphatase: 51 U/L (ref 38–126)
BILIRUBIN TOTAL: 0.5 mg/dL (ref 0.3–1.2)
BUN: 11 mg/dL (ref 6–20)
CALCIUM: 8.9 mg/dL (ref 8.9–10.3)
CO2: 27 mmol/L (ref 22–32)
Chloride: 94 mmol/L — ABNORMAL LOW (ref 98–111)
Creatinine, Ser: 0.73 mg/dL (ref 0.61–1.24)
GFR calc Af Amer: >60 mL/min (ref >60)
GFR calc non Af Amer: >60 mL/min (ref >60)
GLUCOSE: 97 mg/dL (ref 70–99)
Potassium: 3.7 mmol/L (ref 3.5–5.1)
SODIUM: 132 mmol/L — AB (ref 135–145)
Total Protein: 7.6 g/dL (ref 6.5–8.1)

## 2018-03-01 LAB — RAPID URINE DRUG SCREEN, HOSP PERFORMED
AMPHETAMINES: NOT DETECTED
BARBITURATES: NOT DETECTED
Benzodiazepines: NOT DETECTED
COCAINE: NOT DETECTED
OPIATES: NOT DETECTED
TETRAHYDROCANNABINOL: NOT DETECTED

## 2018-03-01 LAB — SALICYLATE LEVEL

## 2018-03-01 LAB — ACETAMINOPHEN LEVEL

## 2018-03-01 LAB — ETHANOL: Alcohol, Ethyl (B): <10 mg/dL (ref <10)

## 2018-03-01 MED ORDER — HYDROXYZINE HCL 25 MG PO TABS
25.0000 mg | ORAL_TABLET | Freq: Once | ORAL | Status: AC
  Administered 2018-03-01: 25 mg via ORAL
  Filled 2018-03-01: qty 1

## 2018-03-01 MED ORDER — DIVALPROEX SODIUM 500 MG PO DR TAB
500.0000 mg | DELAYED_RELEASE_TABLET | Freq: Once | ORAL | Status: AC
  Administered 2018-03-01: 500 mg via ORAL
  Filled 2018-03-01: qty 1

## 2018-03-01 NOTE — BH Assessment (Signed)
Assessment Note  Alexander Fritz is an 27 y.o. male that presents this date voluntary with increased paranoia. Patient reports some S/I although denies any plan or intent. 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 Patient is slow to process the content of this writer's questions although can answer questions with prompting. Patient is oriented to time/place and denies any H/I or AVH. Patient reports some passive S/I although cannot verbalize a plan. Patient renders conflicting history as evidenced by note review with patient stating on admission that he was going to "slit his wrists." Patient denies when ask and shakes his head "no" in reference to plan. Per note review patient presents with complaints of visual and audible hallucinations. Patient reports a history of paranoid schizophrenia and paranoia. Patient reports taking his prescribed medications daily, including depakote. Patient reports suicidal ideations with a plan. Patient reports his plan is to "cut my wrists." Patient calm and cooperative on admission and speaks in a low soft voice. Patient is observed as being calm and cooperative in the emergency room. Patient is from [facility] group home and stated he does not like his group home, does not want to be there and does not want to take his medications. Patient was last in the [facility] on 02/16/2018 for the same reason and was discharged back to his group home. Patient frequently comes to the emergency room because he does not like his group home. Patient received his Haldol Decanoate 50 mg injection on 02/16/2018 prior to discharge form the ED. Case was staffed with Arville Care FNP who recommended patient be discharged back to group home. Patient is psychiatrically clear for discharge. CSW notified to contact group home to transport patient back to facility.   Diagnosis: F84.0 Autism spectrum disorder, Borderline Intellectual functioning  Past Medical  History:  Past Medical History:  Diagnosis Date  . Autism   . Bipolar 1 disorder (HCC)     History reviewed. No pertinent surgical history.  Family History: History reviewed. No pertinent family history.  Social History:  reports that he has been smoking cigarettes. He has a 2.50 pack-year smoking history. He has quit using smokeless tobacco. He reports that he does not drink alcohol or use drugs.  Additional Social History:  Alcohol / Drug Use Pain Medications: See MAR Prescriptions: See MAR Over the Counter: See MAR History of alcohol / drug use?: No history of alcohol / drug abuse Longest period of sobriety (when/how long): UNKNOWN Negative Consequences of Use: (Denies) Withdrawal Symptoms: (Denies)  CIWA: CIWA-Ar BP: (!) 142/59 Pulse Rate: 86 COWS:    Allergies:  Allergies  Allergen Reactions  . Cefaclor Other (See Comments)    Unknown.  Per group home MAR.  Marland Kitchen Lithium Palpitations and Rash    Home Medications:  (Not in a hospital admission)  OB/GYN Status:  No LMP for male patient.  General Assessment Data Location of Assessment: [facility] ED TTS Assessment: In system Is this a Tele or Face-to-Face Assessment?: Face-to-Face Is this an Initial Assessment or a Re-assessment for this encounter?: Initial Assessment Patient Accompanied by:: (NA) Language Other than English: No Living Arrangements: In Group Home: (Comment: Name of Group Home)([facility]) What gender do you identify as?: Male Marital status: Single Maiden name: NA Pregnancy Status: No Living Arrangements: Group Home Can pt return to current living arrangement?: Yes Admission Status: Voluntary Is patient capable of signing voluntary admission?: No Referral Source: Self/Family/Friend Insurance type: Medicare  Medical Screening  Exam [facility] Walk-in ONLY) Medical Exam completed: Yes  Crisis Care Plan Living Arrangements: Group Home Legal Guardian: Other:([facility]) Name of Psychiatrist:  [facility] Name of Therapist: [facility]  Education Status Is patient currently in school?: No Is the patient employed, unemployed or receiving disability?: Receiving disability income  Risk to self with the past 6 months Suicidal Ideation: Yes-Currently Present Has patient been a risk to self within the past 6 months prior to admission? : Yes Suicidal Intent: No Has patient had any suicidal intent within the past 6 months prior to admission? : Yes Is patient at risk for suicide?: Yes Suicidal Plan?: No Has patient had any suicidal plan within the past 6 months prior to admission? : Yes Access to Means: No What has been your use of drugs/alcohol within the last 12 months?: Denies Previous Attempts/Gestures: No How many times?: 0 Other Self Harm Risks: (NA) Triggers for Past Attempts: Unknown Intentional Self Injurious Behavior: None Family Suicide History: No Recent stressful life event(s): Conflict (Comment)(Stress at group home) Persecutory voices/beliefs?: No Depression: No Depression Symptoms: (Denies) Substance abuse history and/or treatment for substance abuse?: No Suicide prevention information given to non-admitted patients: Not applicable  Risk to Others within the past 6 months Homicidal Ideation: No Does patient have any lifetime risk of violence toward others beyond the six months prior to admission? : No Thoughts of Harm to Others: No Current Homicidal Intent: No Current Homicidal Plan: No Access to Homicidal Means: No Identified Victim: NA History of harm to others?: No Assessment of Violence: None Noted Violent Behavior Description: NA Does patient have access to weapons?: No Criminal Charges Pending?: No Does patient have a court date: No Is patient on probation?: No  Psychosis Hallucinations: None noted Delusions: None noted  Mental Status Report Appearance/Hygiene: In scrubs Eye Contact: Fair Motor Activity: Freedom of movement Speech: Soft, Slow Level of  Consciousness: Quiet/awake Mood: Anxious Affect: Irritable Anxiety Level: Moderate Thought Processes: Thought Blocking Judgement: Partial Orientation: Person, Place, Time Obsessive Compulsive Thoughts/Behaviors: None  Cognitive Functioning Concentration: Decreased Memory: Unable to Assess Is patient IDD: Yes Level of Function: Borderline Is IQ score available?: Yes(74) Insight: Poor Impulse Control: Poor Appetite: Fair Have you had any weight changes? : No Change Sleep: No Change Total Hours of Sleep: 6 Vegetative Symptoms: None  ADLScreening [facility] Assessment Services) Patient's cognitive ability adequate to safely complete daily activities?: Yes Patient able to express need for assistance with ADLs?: Yes Independently performs ADLs?: Yes (appropriate for developmental age)  Prior Inpatient Therapy Prior Inpatient Therapy: Yes Prior Therapy Dates: 2019 Prior Therapy Facilty/Provider(s): [facility] Reason for Treatment: MH issues  Prior Outpatient Therapy Prior Outpatient Therapy: No Does patient have an ACCT team?: No Does patient have Intensive In-House Services?  : No Does patient have [facility] services? : No Does patient have P4CC services?: No  ADL Screening (condition at time of admission) Patient's cognitive ability adequate to safely complete daily activities?: Yes Is the patient deaf or have difficulty hearing?: No Does the patient have difficulty seeing, even when wearing glasses/contacts?: No Does the patient have difficulty concentrating, remembering, or making decisions?: Yes Patient able to express need for assistance with ADLs?: Yes Does the patient have difficulty dressing or bathing?: No Independently performs ADLs?: Yes (appropriate for developmental age) Does the patient have difficulty walking or climbing stairs?: No Weakness of Legs: None Weakness of Arms/Hands: None  Home Assistive Devices/Equipment Home Assistive Devices/Equipment:  None  Therapy Consults (therapy consults require a physician order)  PT Evaluation Needed: No OT Evalulation Needed: No SLP Evaluation Needed: No Abuse/Neglect Assessment (Assessment to be complete while patient is alone) Physical Abuse: Denies Verbal Abuse: Denies Sexual Abuse: Denies Exploitation of patient/patient's resources: Denies Self-Neglect: Denies Values / Beliefs Cultural Requests During Hospitalization: None Spiritual Requests During Hospitalization: None Consults Spiritual Care Consult Needed: No Social Work Consult Needed: No Merchant navy officer (For Healthcare) Does Patient Have a Medical Advance Directive?: No Would patient like information on creating a medical advance directive?: No - Patient declined          Disposition: Case was staffed with Arville Care FNP who recommended patient be discharged back to group home. Patient is psychiatrically clear for discharge. CSW notified to contact group home to transport patient back to facility.    Disposition Initial Assessment Completed for this Encounter: Yes Disposition of Patient: Discharge Patient refused recommended treatment: No Mode of transportation if patient is discharged?: Car Patient referred to: Other (Comment)(Referred back to group home)  On Site Evaluation by:   Reviewed with Physician:    Alfredia Ferguson 03/01/2018 4:36 PM

## 2018-03-01 NOTE — ED Notes (Signed)
Patient discharge ambulatory to group home with group home staff. Patient belongings given back to patient. No acute distress noted.

## 2018-03-01 NOTE — Progress Notes (Signed)
CSW spoke to registyration whomis contacting security to bring [place] from the [facility] back to p/u the pt.  RN updated.   Please reconsult if future social work needs arise.  CSW signing off, as social work intervention is no longer needed.  Alexander Fritz. Grant Henkes, LCSW, LCAS, CSI Clinical Social Worker Ph: [phone]

## 2018-03-01 NOTE — Progress Notes (Addendum)
CSW received a call from disposition pt is cleared for D/C.  RN updated.  CSW called Jeanice Lim from [facility] at ph: [phone] and left a HIPPA-compliant VM and sent a HIPPA-compliant text for a call back.  CSW called the driver from [facility] [place] [phone]) at ph: [phone] and Gregary Signs stated he is not working today.  5:08 PM CSW received a call from Francee Piccolo the group home manager who stated:  Pt's group home driver Gregary Signs at ph: [phone] will be coming to p/u the pt at 11pm tonight.    RN updated, RN will give pt his nighttime meds and dinner before the pt is D/C'd.  CSW will continue to follow for D/C needs.  Dorothe Pea. Lamona Eimer, LCSW, LCAS, CSI Clinical Social Worker Ph: [phone]

## 2018-03-01 NOTE — BH Assessment (Signed)
[facility] Assessment Progress Note  Case was staffed with Arville Care FNP who recommended patient be discharged back to group home. Patient is psychiatrically clear for discharge. CSW notified to contact group home to transport patient back to facility.

## 2018-03-01 NOTE — ED Notes (Signed)
Bed: WA27 Expected date:  Expected time:  Means of arrival:  Comments: 

## 2018-03-01 NOTE — Discharge Instructions (Addendum)
Patient been cleared by behavioral health to return back to group home.  Follow-up as per behavioral health.

## 2018-03-01 NOTE — Progress Notes (Addendum)
Patient ID: Alexander Fritz, male   DOB: 02/14/1991, 27 y.o.   MRN: [id number]  Pt was seen and chart reviewed with treatment team. Pt is observed as being calm and cooperative in the emergency room. Pt is from [facility] group home and stated he does not like his group home, does not want to be there and does not want to take his medications. Pt was last in the [facility] on 02-16-2018 for the same reason and was discharged back to his group home. Pt frequently comes to the emergency room because he does not like his group home. Pt received his Haldol Decanoate 50 mg injection on 02-16-2018 prior to discharge form the ED. Pt is psychiatrically clear for discharge.   Laveda Abbe, NP-C 03-01-2018     [phone]  Patient's chart reviewed and case discussed with the physician extender and developed treatment plan. Reviewed the information documented and agree with the treatment plan.  Juanetta Beets, DO 03/05/18 4:44 PM

## 2018-03-01 NOTE — ED Provider Notes (Signed)
[facility]-EMERGENCY DEPT Provider Note   CSN: [id number] Arrival date & time: 03/01/18  1522     History   Chief Complaint Chief Complaint  Patient presents with  . Hallucinations  . Suicidal    HPI Alexander Fritz is a 27 y.o. male with history of autism, bipolar 1 disorder, schizoaffective disorder presents for evaluation of suicidal ideation and auditory and visual hallucinations.  He states he sees "talking heads "and then begins to laugh.  He states he is suicidal with a plan to cut himself.  Denies homicidal ideation.  He states he is tired of being in his group home and taking his medications.  Unclear if he has been compliant with his home medicines.  Denies any other complaints at this time.  The history is provided by the patient.    Past Medical History:  Diagnosis Date  . Autism   . Bipolar 1 disorder [facility])     Patient Active Problem List   Diagnosis Date Noted  . Suicidal ideation   . Visual hallucination   . Auditory hallucination 01/31/2017  . Schizoaffective disorder, bipolar type (HCC) 10/05/2016  . Autism spectrum disorder 08/26/2016    History reviewed. No pertinent surgical history.      Home Medications    Prior to Admission medications   Medication Sig Start Date End Date Taking? Authorizing Provider  cetirizine (ZYRTEC) 10 MG tablet Take 10 mg by mouth daily. (0800)    [provider]  divalproex (DEPAKOTE) 500 MG DR tablet Take 1 tablet (500 mg total) by mouth 2 (two) times daily. 11/25/16   Charlynne Pander, MD  docusate sodium (COLACE) 100 MG capsule Take 100 mg by mouth every morning.     [provider]  haloperidol decanoate (HALDOL DECANOATE) 100 MG/ML injection Inject 0.5 mLs (50 mg total) into the muscle every 30 (thirty) days. 12/23/16   Charm Rings, NP  hydrOXYzine (ATARAX/VISTARIL) 25 MG tablet Take 1 tablet (25 mg total) by mouth 2 (two) times daily as needed for anxiety (agitation). 11/23/16    Charm Rings, NP  trihexyphenidyl (ARTANE) 5 MG tablet Take 1 tablet (5 mg total) by mouth 2 (two) times daily with breakfast and lunch. (0800 & 2000) 11/23/16   Charm Rings, NP    Family History History reviewed. No pertinent family history.  Social History Social History   Tobacco Use  . Smoking status: Current Every Day Smoker    Packs/day: 0.50    Years: 5.00    Pack years: 2.50    Types: Cigarettes  . Smokeless tobacco: Former Engineer, water Use Topics  . Alcohol use: No  . Drug use: No     Allergies   Cefaclor and Lithium   Review of Systems Review of Systems  Constitutional: Negative for chills and fever.  Respiratory: Negative for shortness of breath.   Cardiovascular: Negative for chest pain.  Gastrointestinal: Negative for abdominal pain, nausea and vomiting.  Psychiatric/Behavioral: Positive for hallucinations and suicidal ideas.  All other systems reviewed and are negative.    Physical Exam Updated Vital Signs BP (!) 142/59 (BP Location: Left Arm)   Pulse 86   Temp 97.9 F (36.6 C) (Oral)   Resp 16   SpO2 100%   Physical Exam  Constitutional: He appears well-developed and well-nourished. No distress.  HENT:  Head: Normocephalic and atraumatic.  Eyes: Conjunctivae are normal. Right eye exhibits no discharge. Left eye exhibits no discharge.  Neck: No JVD  present. No tracheal deviation present.  Cardiovascular: Normal rate.  Pulmonary/Chest: Effort normal.  Abdominal: Soft. He exhibits no distension. There is no tenderness. There is no guarding.  Musculoskeletal: He exhibits no edema.  Neurological: He is alert.  Skin: Skin is warm and dry. No erythema.  Psychiatric: His speech is delayed. He is slowed and withdrawn. He expresses suicidal ideation. He expresses no homicidal ideation. He expresses suicidal plans. He expresses no homicidal plans.  Difficult to ascertain history, patient frequently fidgeting and makes poor eye contact.   Frequently staring off for several seconds at a time. Has difficulty answering some questions.  He is inattentive.  Nursing note and vitals reviewed.    ED Treatments / Results  Labs (all labs ordered are listed, but only abnormal results are displayed) Labs Reviewed  COMPREHENSIVE METABOLIC PANEL - Abnormal; Notable for the following components:      Result Value   Sodium 132 (*)    Chloride 94 (*)    All other components within normal limits  ACETAMINOPHEN LEVEL - Abnormal; Notable for the following components:   Acetaminophen (Tylenol), Serum <10 (*)    All other components within normal limits  ETHANOL  SALICYLATE LEVEL  CBC  RAPID URINE DRUG SCREEN, HOSP PERFORMED    EKG None  Radiology No results found.  Procedures Procedures (including critical care time)  Medications Ordered in ED Medications  divalproex (DEPAKOTE) DR tablet 500 mg (500 mg Oral Given 03/01/18 1742)  hydrOXYzine (ATARAX/VISTARIL) tablet 25 mg (25 mg Oral Given 03/01/18 1742)     Initial Impression / Assessment and Plan / ED Course  I have reviewed the triage vital signs and the nursing notes.  Pertinent labs & imaging results that were available during my care of the patient were reviewed by me and considered in my medical decision making (see chart for details).     Patient with suicidal ideation and hallucinations presenting from group home.  He is afebrile, vital signs are stable.  He is nontoxic in appearance.  Physical examination and screening labs reviewed by me is reassuring.  He is medically clear for TTS evaluation at this time.  5:30PM TTS has seen and evaluated the patient, state he is psychiatrically clear for discharge back to his group home.  He was given his evening medicines in the ED today.  Final Clinical Impressions(s) / ED Diagnoses   Final diagnoses:  Schizoaffective disorder, bipolar type [facility])    ED Discharge Orders    None       Jeanie Sewer,  PA-C 03/01/18 1921    Vanetta Mulders, MD 03/03/18 [phone]

## 2018-03-01 NOTE — ED Triage Notes (Signed)
Patient presents with complaints of visual and audible hallucinations. Patient reports a history of paranoid schizophrenia and paranoia. Patient reports taking his prescribed medications daily, including depakote. Patient reports suicidal ideations with a plan. Patient reports his plan is to "cut my wrists." Patient calm and cooperative in triage.

## 2018-03-02 NOTE — Progress Notes (Signed)
CSW notified the pt's legal guardian the [employer] [place] at ph: [phone] that the pt was D/C'd.  The on-call worker at the [facility] voiced understanding.  CSW will continue to follow for D/C needs.  Dorothe Pea. Melisse Caetano, LCSW, LCAS, CSI Clinical Social Worker Ph: [phone]

## 2018-03-18 IMAGING — CR DG CHEST 2V
2 series · 2 of 2 positions shown · non-contrast
Comparison: None.

CLINICAL DATA: Fever

EXAM:
CHEST  2 VIEW

[w chest pa]
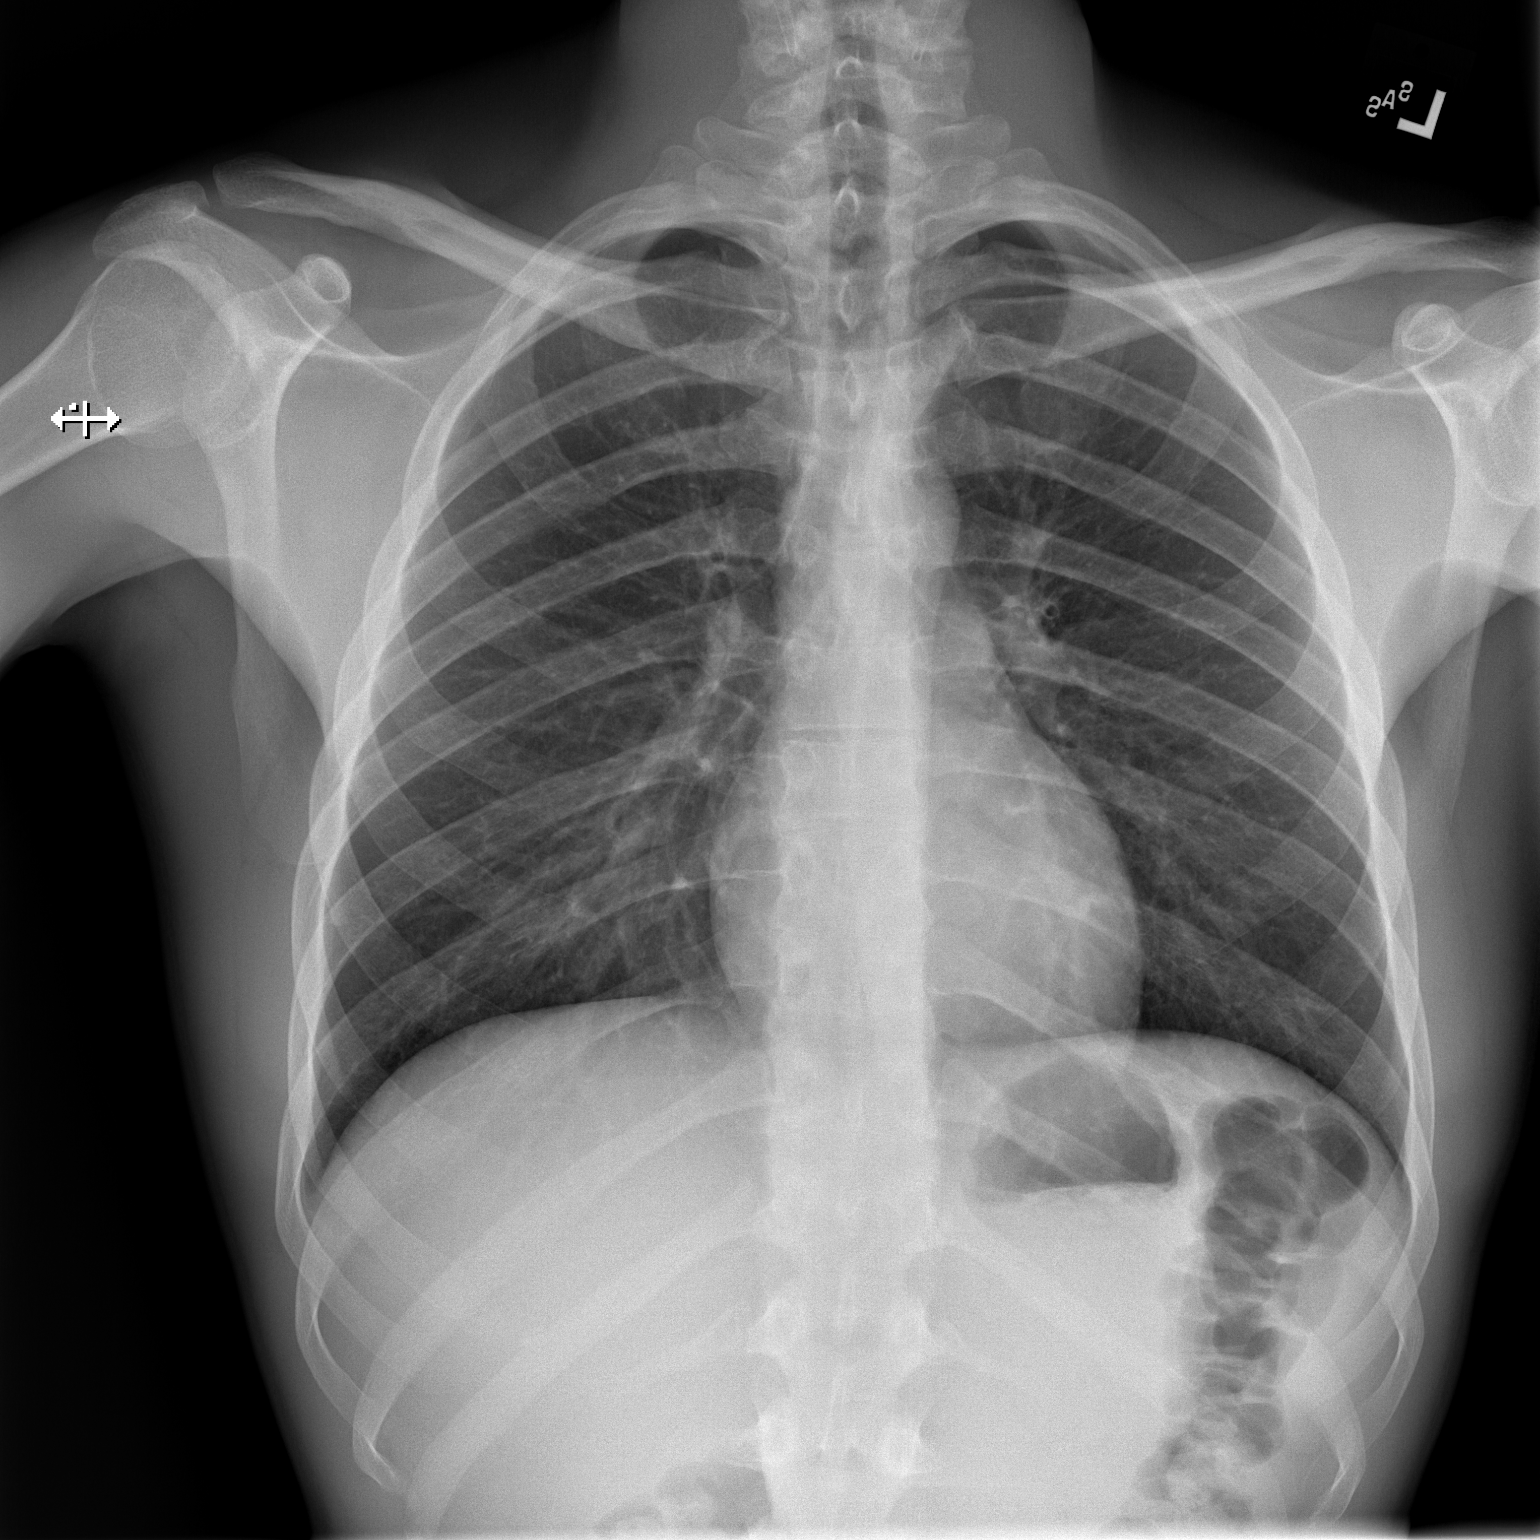

[w chest lat]
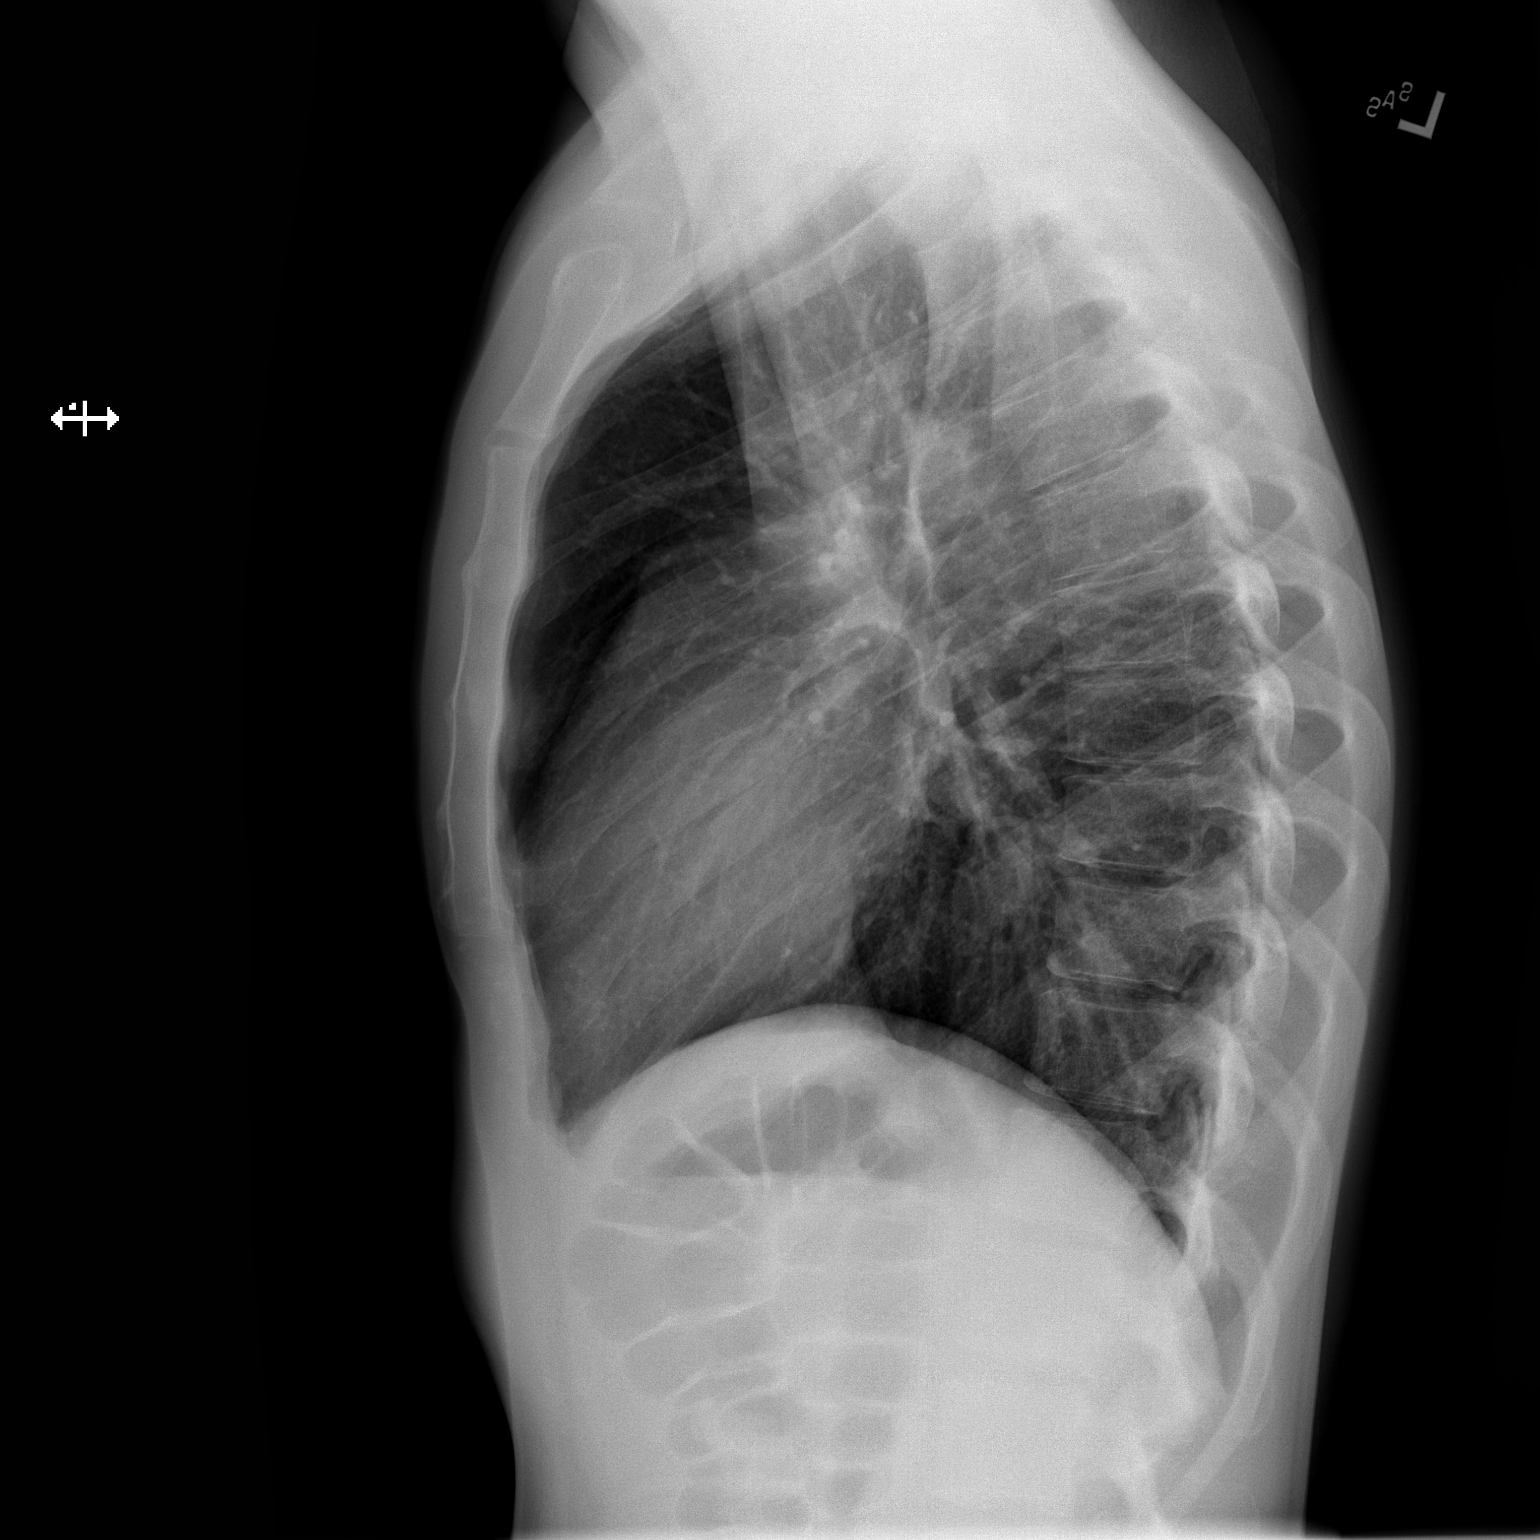

[2 of 2 positions shown; findings below may reference images not displayed]

FINDINGS: The heart size and mediastinal contours are within normal limits.
Both lungs are clear. The visualized skeletal structures are
unremarkable.
IMPRESSION: No active cardiopulmonary disease.

## 2018-05-30 ENCOUNTER — Encounter (HOSPITAL_COMMUNITY): Payer: Self-pay | Admitting: Emergency Medicine

## 2018-05-30 ENCOUNTER — Other Ambulatory Visit: Payer: Self-pay

## 2018-05-30 ENCOUNTER — Emergency Department (HOSPITAL_COMMUNITY)
Admission: EM | Admit: 2018-05-30 | Discharge: 2018-05-30 | Disposition: A | Discharge status: Home / Self Care | Payer: Medicare Other | Attending: Emergency Medicine | Admitting: Emergency Medicine

## 2018-05-30 DIAGNOSIS — F22 Delusional disorders: Secondary | ICD-10-CM | POA: Diagnosis present

## 2018-05-30 DIAGNOSIS — F840 Autistic disorder: Secondary | ICD-10-CM | POA: Insufficient documentation

## 2018-05-30 DIAGNOSIS — Z79899 Other long term (current) drug therapy: Secondary | ICD-10-CM | POA: Insufficient documentation

## 2018-05-30 DIAGNOSIS — F250 Schizoaffective disorder, bipolar type: Secondary | ICD-10-CM | POA: Diagnosis not present

## 2018-05-30 DIAGNOSIS — F17210 Nicotine dependence, cigarettes, uncomplicated: Secondary | ICD-10-CM | POA: Insufficient documentation

## 2018-05-30 DIAGNOSIS — F29 Unspecified psychosis not due to a substance or known physiological condition: Secondary | ICD-10-CM

## 2018-05-30 LAB — COMPREHENSIVE METABOLIC PANEL
ALK PHOS: 48 U/L (ref 38–126)
ALT: 21 U/L (ref 0–44)
ANION GAP: 11 (ref 5–15)
AST: 21 U/L (ref 15–41)
Albumin: 4.8 g/dL (ref 3.5–5.0)
BUN: 11 mg/dL (ref 6–20)
CALCIUM: 9.8 mg/dL (ref 8.9–10.3)
CO2: 29 mmol/L (ref 22–32)
Chloride: 99 mmol/L (ref 98–111)
Creatinine, Ser: 0.98 mg/dL (ref 0.61–1.24)
GFR calc non Af Amer: >60 mL/min (ref >60)
Glucose, Bld: 111 mg/dL — ABNORMAL HIGH (ref 70–99)
Potassium: 3.8 mmol/L (ref 3.5–5.1)
SODIUM: 139 mmol/L (ref 135–145)
Total Bilirubin: 0.6 mg/dL (ref 0.3–1.2)
Total Protein: 8.4 g/dL — ABNORMAL HIGH (ref 6.5–8.1)

## 2018-05-30 LAB — CBC WITH DIFFERENTIAL/PLATELET
Abs Immature Granulocytes: 0.05 10*3/uL (ref 0.00–0.07)
BASOS ABS: 0.1 10*3/uL (ref 0.0–0.1)
Basophils Relative: 1 %
EOS PCT: 1 %
Eosinophils Absolute: 0.1 10*3/uL (ref 0.0–0.5)
HEMATOCRIT: 50.4 % (ref 39.0–52.0)
Hemoglobin: 16.8 g/dL (ref 13.0–17.0)
Immature Granulocytes: 1 %
LYMPHS ABS: 1.7 10*3/uL (ref 0.7–4.0)
Lymphocytes Relative: 17 %
MCH: 30 pg (ref 26.0–34.0)
MCHC: 33.3 g/dL (ref 30.0–36.0)
MCV: 90 fL (ref 80.0–100.0)
Monocytes Absolute: 0.6 10*3/uL (ref 0.1–1.0)
Monocytes Relative: 6 %
Neutro Abs: 7.6 10*3/uL (ref 1.7–7.7)
Neutrophils Relative %: 74 %
Platelets: 265 10*3/uL (ref 150–400)
RBC: 5.6 MIL/uL (ref 4.22–5.81)
RDW: 12.1 % (ref 11.5–15.5)
WBC: 10 10*3/uL (ref 4.0–10.5)
nRBC: 0 % (ref 0.0–0.2)

## 2018-05-30 LAB — ETHANOL

## 2018-05-30 LAB — ACETAMINOPHEN LEVEL

## 2018-05-30 LAB — SALICYLATE LEVEL: Salicylate Lvl: <7 mg/dL (ref 2.8–30.0)

## 2018-05-30 MED ORDER — ARIPIPRAZOLE 10 MG PO TABS: 10.0000 mg | ORAL_TABLET | Freq: Every day | ORAL | Status: DC

## 2018-05-30 MED ORDER — ARIPIPRAZOLE 10 MG PO TABS: 10.0000 mg | ORAL_TABLET | Freq: Every day | ORAL | 0 refills | Status: DC
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
## 2018-05-30 NOTE — Discharge Instructions (Addendum)
For your mental health needs and your medication needs please follow up with Dr Silverio Lay at [facility], your current outpatient provider.

## 2018-05-30 NOTE — ED Notes (Signed)
Alexander Fritz was discharge with the representative from [facility] group [facility]. He was alert and oriented. VS was WNL.
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
## 2018-05-30 NOTE — ED Provider Notes (Signed)
[facility]-EMERGENCY DEPT Provider Note   CSN: [id number] Arrival date & time: 05/30/18  1521     History   Chief Complaint Chief Complaint  Patient presents with  . Paranoid    HPI Alexander Fritz is a 28 y.o. male.  HPI   Patient is a 28 yo male with a history of autism, bipolar 1 disorder presenting for paranoia and hallucinations.  Patient was brought to the emergency department by his group home supervisors.  History was obtained from Ladene Artist, primary supervisor at patient's group home, [facility].  According to Francee Piccolo, patient ran out of the house today, was "swinging" at staff members, and stated "I need to go to the hospital".  He reports that the patient has done this before when his Aristada shot is "wearing off".  He has an appointment next week.  His primary care team is at [facility] and receives his shots under the care of Dr. Silverio Lay.  On my assessment of the patient, he is calm and collected has no complaints at this time.  He denies feeling that anyone is going to hurt him, denies any SI/HI.  He does report AVH of "hearing voices".   Level 5 caveat psychosis.   Past Medical History:  Diagnosis Date  . Autism   . Bipolar 1 disorder [facility])     Patient Active Problem List   Diagnosis Date Noted  . Suicidal ideation   . Visual hallucination   . Auditory hallucination 01/31/2017  . Schizoaffective disorder, bipolar type (HCC) 10/05/2016  . Autism spectrum disorder 08/26/2016    History reviewed. No pertinent surgical history.      Home Medications    Prior to Admission medications   Medication Sig Start Date End Date Taking? Authorizing Provider  cetirizine (ZYRTEC) 10 MG tablet Take 10 mg by mouth daily. (0800)    [provider]  divalproex (DEPAKOTE) 500 MG DR tablet Take 1 tablet (500 mg total) by mouth 2 (two) times daily. 11/25/16   Charlynne Pander, MD  docusate sodium (COLACE) 100 MG capsule Take 100 mg by  mouth every morning.     [provider]  haloperidol decanoate (HALDOL DECANOATE) 100 MG/ML injection Inject 0.5 mLs (50 mg total) into the muscle every 30 (thirty) days. 12/23/16   Charm Rings, NP  hydrOXYzine (ATARAX/VISTARIL) 25 MG tablet Take 1 tablet (25 mg total) by mouth 2 (two) times daily as needed for anxiety (agitation). 11/23/16   Charm Rings, NP  trihexyphenidyl (ARTANE) 5 MG tablet Take 1 tablet (5 mg total) by mouth 2 (two) times daily with breakfast and lunch. (0800 & 2000) 11/23/16   Charm Rings, NP    Family History History reviewed. No pertinent family history.  Social History Social History   Tobacco Use  . Smoking status: Current Every Day Smoker    Packs/day: 0.50    Years: 5.00    Pack years: 2.50    Types: Cigarettes  . Smokeless tobacco: Former Engineer, water Use Topics  . Alcohol use: No  . Drug use: No     Allergies   Cefaclor and Lithium   Review of Systems Review of Systems  Psychiatric/Behavioral: Positive for agitation and hallucinations. Negative for suicidal ideas. The patient is not nervous/anxious.    Level 5 caveat psychosis.   Physical Exam Updated Vital Signs BP 125/76 (BP Location: Left Arm)   Pulse 92   Temp 99 F (37.2 C) (Oral)  Resp 17   Ht 5\' 8"  (1.727 m)   SpO2 100%   BMI 23.42 kg/m   Physical Exam Vitals signs and nursing note reviewed.  Constitutional:      General: He is not in acute distress.    Appearance: He is well-developed.  HENT:     Head: Normocephalic and atraumatic.     Nose: Nose normal.  Eyes:     Extraocular Movements: Extraocular movements intact.     Conjunctiva/sclera: Conjunctivae normal.     Pupils: Pupils are equal, round, and reactive to light.  Neck:     Musculoskeletal: Normal range of motion and neck supple.  Cardiovascular:     Rate and Rhythm: Normal rate and regular rhythm.     Heart sounds: S1 normal and S2 normal. No murmur.  Pulmonary:     Effort: Pulmonary  effort is normal.     Breath sounds: Normal breath sounds. No wheezing or rales.  Abdominal:     General: There is no distension.     Palpations: Abdomen is soft.     Tenderness: There is no abdominal tenderness. There is no guarding.  Musculoskeletal: Normal range of motion.        General: No deformity.  Lymphadenopathy:     Cervical: No cervical adenopathy.  Skin:    General: Skin is warm and dry.     Findings: No erythema or rash ( ).  Neurological:     Mental Status: He is alert.     Comments: Cranial nerves grossly intact. Patient moves extremities symmetrically and with good coordination. Normal and symmetric gait.  Strength 5 out of 5 in upper and lower extremities. Patient has ticks, with repetitive head-bobbing.  Psychiatric:     Comments: Patient is alert and follows commands.  Appears to be responding to internal stimuli.  Does appear to have some thought blocking.  When patient does speak, and is fluent without evidence of aphasia.  Poor judgment and insight, as patient is unable to identify why he is here.      ED Treatments / Results  Labs (all labs ordered are listed, but only abnormal results are displayed) Labs Reviewed - No data to display  EKG None  Radiology No results found.  Procedures Procedures (including critical care time)  Medications Ordered in ED Medications - No data to display   Initial Impression / Assessment and Plan / ED Course  I have reviewed the triage vital signs and the nursing notes.  Pertinent labs & imaging results that were available during my care of the patient were reviewed by me and considered in my medical decision making (see chart for details).     Patient well-appearing and in no acute distress.  Patient with long history of psychosis.  Spoke with patient's primary team at his group home, and states that they believe that his Julianne Riceristada shot is "wearing off" and he is gotten in the hospital before.  Cannot find in  the medical record.  Patient has received this from our facility.  [facility].   Case was discussed with Elta GuadeloupeLaurie Parks, NP recommends Abilify, 10 mg twice daily until he can get the shot.  Awaiting medication list from patient's group home.  Patient was more interactive for TTS evaluation.  Patient was evaluated by Wayne BothLauren Parks, FNP.  Patient was prescribed p.o. Abilify to bridge until he is given his Aristada shot by Automatic DataFNP Britton.  No emergent cause for hospitalization identified  at this time.  Patient safely discharged back to group home.  Final Clinical Impressions(s) / ED Diagnoses   Final diagnoses:  Psychosis, unspecified psychosis type [facility](HCC)    ED Discharge Orders         Ordered    ARIPiprazole (ABILIFY) 10 MG tablet  Daily     05/30/18 1850           Delia ChimesMurray, Alyssa B, PA-C 05/30/18 2056    Charlynne PanderYao, David Hsienta, MD 05/30/18 [id number]

## 2018-05-30 NOTE — ED Triage Notes (Signed)
Pt brought in by group home employee.  Pt states he his "a little paranoid"  Pt appears to be responding to internal stimuli.  He appears to be having some thought blocking.  Pt states he has been taking his medication as prescribed .

## 2018-05-30 NOTE — BH Assessment (Signed)
This Clinical research associate spoke to [employer], PA-C & notified of pt's recommended disposition.  This Clinical research associate also spoke with Derrik Willa Rough ([facility] at ph: [phone]) to update pt has been psychiatrically cleared & that pt is to f/u with his outpt provider for his scheduled Abilify injection. Pt was given script for oral Abilify X 1 week to get pt to his appt for injection. Derrick reports that he is out of town & he let group home staff member go home because he thought pt would spend the night in the ED. Ladene Artist states he thinks he can get pt picked up within the hour, but if delayed he will call ED to notify.
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
## 2018-07-01 ENCOUNTER — Emergency Department (HOSPITAL_COMMUNITY)
Admission: EM | Admit: 2018-07-01 | Discharge: 2018-07-02 | Disposition: A | Discharge status: Home / Self Care | Payer: Medicare Other | Attending: Emergency Medicine | Admitting: Emergency Medicine

## 2018-07-01 ENCOUNTER — Encounter (HOSPITAL_COMMUNITY): Payer: Self-pay

## 2018-07-01 ENCOUNTER — Other Ambulatory Visit: Payer: Self-pay

## 2018-07-01 DIAGNOSIS — F840 Autistic disorder: Secondary | ICD-10-CM | POA: Insufficient documentation

## 2018-07-01 DIAGNOSIS — F4324 Adjustment disorder with disturbance of conduct: Secondary | ICD-10-CM | POA: Diagnosis not present

## 2018-07-01 DIAGNOSIS — R4689 Other symptoms and signs involving appearance and behavior: Secondary | ICD-10-CM | POA: Diagnosis present

## 2018-07-01 DIAGNOSIS — F17210 Nicotine dependence, cigarettes, uncomplicated: Secondary | ICD-10-CM | POA: Insufficient documentation

## 2018-07-01 DIAGNOSIS — Z79899 Other long term (current) drug therapy: Secondary | ICD-10-CM | POA: Diagnosis not present

## 2018-07-01 DIAGNOSIS — F319 Bipolar disorder, unspecified: Secondary | ICD-10-CM | POA: Diagnosis not present

## 2018-07-01 LAB — RAPID URINE DRUG SCREEN, HOSP PERFORMED
Amphetamines: NOT DETECTED
Barbiturates: NOT DETECTED
Benzodiazepines: NOT DETECTED
Cocaine: NOT DETECTED
OPIATES: NOT DETECTED
TETRAHYDROCANNABINOL: NOT DETECTED

## 2018-07-01 LAB — CBC WITH DIFFERENTIAL/PLATELET
Abs Immature Granulocytes: 0.04 10*3/uL (ref 0.00–0.07)
Basophils Absolute: 0 10*3/uL (ref 0.0–0.1)
Basophils Relative: 0 %
Eosinophils Absolute: 0 10*3/uL (ref 0.0–0.5)
Eosinophils Relative: 0 %
HCT: 46.7 % (ref 39.0–52.0)
Hemoglobin: 15.6 g/dL (ref 13.0–17.0)
Immature Granulocytes: 0 %
Lymphocytes Relative: 10 %
Lymphs Abs: 1 10*3/uL (ref 0.7–4.0)
MCH: 29.2 pg (ref 26.0–34.0)
MCHC: 33.4 g/dL (ref 30.0–36.0)
MCV: 87.5 fL (ref 80.0–100.0)
Monocytes Absolute: 0.6 10*3/uL (ref 0.1–1.0)
Monocytes Relative: 6 %
Neutro Abs: 7.9 10*3/uL — ABNORMAL HIGH (ref 1.7–7.7)
Neutrophils Relative %: 84 %
PLATELETS: 254 10*3/uL (ref 150–400)
RBC: 5.34 MIL/uL (ref 4.22–5.81)
RDW: 12.2 % (ref 11.5–15.5)
WBC: 9.5 10*3/uL (ref 4.0–10.5)
nRBC: 0 % (ref 0.0–0.2)

## 2018-07-01 LAB — BASIC METABOLIC PANEL
Anion gap: 9 (ref 5–15)
BUN: 9 mg/dL (ref 6–20)
CO2: 26 mmol/L (ref 22–32)
Calcium: 9.6 mg/dL (ref 8.9–10.3)
Chloride: 103 mmol/L (ref 98–111)
Creatinine, Ser: 0.88 mg/dL (ref 0.61–1.24)
GFR calc Af Amer: >60 mL/min (ref >60)
GFR calc non Af Amer: >60 mL/min (ref >60)
Glucose, Bld: 85 mg/dL (ref 70–99)
Potassium: 3.8 mmol/L (ref 3.5–5.1)
Sodium: 138 mmol/L (ref 135–145)

## 2018-07-01 LAB — ACETAMINOPHEN LEVEL: Acetaminophen (Tylenol), Serum: <10 ug/mL — ABNORMAL LOW (ref 10–30)

## 2018-07-01 LAB — VALPROIC ACID LEVEL: Valproic Acid Lvl: 90 ug/mL (ref 50.0–100.0)

## 2018-07-01 LAB — SALICYLATE LEVEL: Salicylate Lvl: <7 mg/dL (ref 2.8–30.0)

## 2018-07-01 LAB — ETHANOL: Alcohol, Ethyl (B): <10 mg/dL (ref <10)

## 2018-07-01 NOTE — ED Provider Notes (Signed)
Review labs for med clearance   +legal guardian  Medically clear   Arthor Captain, PA-C 07/02/18 0019    Loren Racer, MD 07/05/18 2125

## 2018-07-01 NOTE — ED Notes (Signed)
EDPA Provider at bedside. 
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
## 2018-07-01 NOTE — ED Notes (Signed)
Pt alert and cooperative. Pt disorganized. Pt denies si, hi, and avh. Pt resting bed will continue to monitor.

## 2018-07-01 NOTE — BH Assessment (Signed)
[facility] Assessment Progress Note  Case was staffed with Shaune PollackLord DNP who recommended patient be observed and monitored for safety. Psychiatry will see in the a.m.
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
## 2018-07-01 NOTE — ED Notes (Signed)
Bed: WBH34 Expected date:  Expected time:  Means of arrival:  Comments: 

## 2018-07-01 NOTE — ED Notes (Signed)
Bed: FI43 Expected date:  Expected time:  Means of arrival:  Comments: [employer]-vol-psych eval
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
## 2018-07-02 DIAGNOSIS — F4324 Adjustment disorder with disturbance of conduct: Secondary | ICD-10-CM | POA: Diagnosis not present
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
## 2018-07-02 NOTE — Discharge Instructions (Signed)
For your behavioral health needs, you are advised to continue treatment at [facility]:       [facility]      [address]      [place], [place] [address]      [phone]

## 2018-07-02 NOTE — ED Notes (Signed)
Patient denies pain and is resting comfortably.  
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
## 2018-08-06 ENCOUNTER — Encounter (HOSPITAL_COMMUNITY): Payer: Self-pay

## 2018-08-06 ENCOUNTER — Other Ambulatory Visit: Payer: Self-pay

## 2018-08-06 ENCOUNTER — Emergency Department (HOSPITAL_COMMUNITY)
Admission: EM | Admit: 2018-08-06 | Discharge: 2018-08-06 | Disposition: A | Discharge status: Home / Self Care | Payer: Medicare Other | Attending: Emergency Medicine | Admitting: Emergency Medicine

## 2018-08-06 DIAGNOSIS — F319 Bipolar disorder, unspecified: Secondary | ICD-10-CM | POA: Insufficient documentation

## 2018-08-06 DIAGNOSIS — R4689 Other symptoms and signs involving appearance and behavior: Secondary | ICD-10-CM | POA: Diagnosis not present

## 2018-08-06 DIAGNOSIS — F840 Autistic disorder: Secondary | ICD-10-CM | POA: Insufficient documentation

## 2018-08-06 DIAGNOSIS — R463 Overactivity: Secondary | ICD-10-CM | POA: Insufficient documentation

## 2018-08-06 DIAGNOSIS — F17210 Nicotine dependence, cigarettes, uncomplicated: Secondary | ICD-10-CM | POA: Diagnosis not present

## 2018-08-06 DIAGNOSIS — Z79899 Other long term (current) drug therapy: Secondary | ICD-10-CM | POA: Diagnosis not present

## 2018-08-06 DIAGNOSIS — F22 Delusional disorders: Secondary | ICD-10-CM | POA: Diagnosis present

## 2018-08-06 DIAGNOSIS — F250 Schizoaffective disorder, bipolar type: Secondary | ICD-10-CM | POA: Insufficient documentation

## 2018-08-06 NOTE — ED Notes (Signed)
Provider at bedside
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
## 2018-08-06 NOTE — ED Notes (Signed)
A group home person came to get the patient

## 2018-08-06 NOTE — ED Notes (Signed)
Pt pleasant on approach, voicing no complaints. Denies SI/HI/AVH.

## 2018-08-06 NOTE — Discharge Instructions (Signed)
Continue to take all of your home medicines.  Follow-up with your counselor or psychiatrist as scheduled.  Return to the emergency department if any concerning signs or symptoms develop.

## 2018-08-06 NOTE — ED Notes (Signed)
Bed: [facility] Expected date:  Expected time:  Means of arrival:  Comments: Hold for Triage 2

## 2020-04-23 ENCOUNTER — Other Ambulatory Visit: Payer: Self-pay

## 2020-04-23 ENCOUNTER — Emergency Department (HOSPITAL_COMMUNITY)
Admission: EM | Admit: 2020-04-23 | Discharge: 2020-04-24 | Disposition: A | Discharge status: Home / Self Care | Payer: Medicare Other | Attending: Emergency Medicine | Admitting: Emergency Medicine

## 2020-04-23 DIAGNOSIS — Y92009 Unspecified place in unspecified non-institutional (private) residence as the place of occurrence of the external cause: Secondary | ICD-10-CM | POA: Insufficient documentation

## 2020-04-23 DIAGNOSIS — S59901A Unspecified injury of right elbow, initial encounter: Secondary | ICD-10-CM | POA: Diagnosis present

## 2020-04-23 DIAGNOSIS — Z23 Encounter for immunization: Secondary | ICD-10-CM | POA: Insufficient documentation

## 2020-04-23 DIAGNOSIS — Z79899 Other long term (current) drug therapy: Secondary | ICD-10-CM | POA: Insufficient documentation

## 2020-04-23 DIAGNOSIS — S0081XA Abrasion of other part of head, initial encounter: Secondary | ICD-10-CM | POA: Diagnosis not present

## 2020-04-23 DIAGNOSIS — S50312A Abrasion of left elbow, initial encounter: Secondary | ICD-10-CM | POA: Insufficient documentation

## 2020-04-23 DIAGNOSIS — F17210 Nicotine dependence, cigarettes, uncomplicated: Secondary | ICD-10-CM | POA: Diagnosis not present

## 2020-04-23 LAB — COMPREHENSIVE METABOLIC PANEL
ALT: 17 U/L (ref 0–44)
AST: 22 U/L (ref 15–41)
Albumin: 4 g/dL (ref 3.5–5.0)
Alkaline Phosphatase: 43 U/L (ref 38–126)
Anion gap: 15 (ref 5–15)
BUN: 10 mg/dL (ref 6–20)
CO2: 22 mmol/L (ref 22–32)
Calcium: 9.5 mg/dL (ref 8.9–10.3)
Chloride: 100 mmol/L (ref 98–111)
Creatinine, Ser: 1.23 mg/dL (ref 0.61–1.24)
GFR, Estimated: >60 mL/min (ref >60)
Glucose, Bld: 96 mg/dL (ref 70–99)
Potassium: 3.6 mmol/L (ref 3.5–5.1)
Sodium: 137 mmol/L (ref 135–145)
Total Bilirubin: 0.8 mg/dL (ref 0.3–1.2)
Total Protein: 7 g/dL (ref 6.5–8.1)

## 2020-04-23 LAB — ETHANOL: Alcohol, Ethyl (B): <10 mg/dL (ref <10)

## 2020-04-23 LAB — RAPID URINE DRUG SCREEN, HOSP PERFORMED
Amphetamines: NOT DETECTED
Barbiturates: NOT DETECTED
Benzodiazepines: NOT DETECTED
Cocaine: NOT DETECTED
Opiates: NOT DETECTED
Tetrahydrocannabinol: NOT DETECTED

## 2020-04-23 LAB — CBC
HCT: 42.1 % (ref 39.0–52.0)
Hemoglobin: 14.5 g/dL (ref 13.0–17.0)
MCH: 30.9 pg (ref 26.0–34.0)
MCHC: 34.4 g/dL (ref 30.0–36.0)
MCV: 89.8 fL (ref 80.0–100.0)
Platelets: 232 10*3/uL (ref 150–400)
RBC: 4.69 MIL/uL (ref 4.22–5.81)
RDW: 12 % (ref 11.5–15.5)
WBC: 5.9 10*3/uL (ref 4.0–10.5)
nRBC: 0 % (ref 0.0–0.2)

## 2020-04-23 LAB — ACETAMINOPHEN LEVEL: Acetaminophen (Tylenol), Serum: <10 ug/mL — ABNORMAL LOW (ref 10–30)

## 2020-04-23 LAB — SALICYLATE LEVEL: Salicylate Lvl: <7 mg/dL — ABNORMAL LOW (ref 7.0–30.0)
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
## 2020-04-24 DIAGNOSIS — S50312A Abrasion of left elbow, initial encounter: Secondary | ICD-10-CM | POA: Diagnosis not present

## 2020-04-24 MED ORDER — TETANUS-DIPHTH-ACELL PERTUSSIS 5-2.5-18.5 LF-MCG/0.5 IM SUSY
0.5000 mL | PREFILLED_SYRINGE | Freq: Once | INTRAMUSCULAR | Status: AC
  Administered 2020-04-24: 0.5 mL via INTRAMUSCULAR
  Filled 2020-04-24: qty 0.5

## 2020-04-24 MED ORDER — KETOROLAC TROMETHAMINE 60 MG/2ML IM SOLN
60.0000 mg | Freq: Once | INTRAMUSCULAR | Status: AC
  Administered 2020-04-24: 60 mg via INTRAMUSCULAR
  Filled 2020-04-24: qty 2

## 2020-04-24 NOTE — Progress Notes (Signed)
CSW met with patient and introduced self and role. Patient reported he wanted to go back to his group home. RN approached and informed CSW she reached out to [place] at the group home who is arranging staff for transportation.

## 2020-04-24 NOTE — ED Notes (Signed)
Pt given turkey sandwich bag and water per RN orders 
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
## 2020-04-24 NOTE — Discharge Instructions (Addendum)
Please return to the ER for any new or worsening symptoms

## 2020-04-24 NOTE — ED Notes (Signed)
Breakfast has been ordered please check Triage for tray

## 2021-07-02 ENCOUNTER — Emergency Department (HOSPITAL_COMMUNITY)
Admission: EM | Admit: 2021-07-02 | Discharge: 2021-07-02 | Disposition: A | Discharge status: Home / Self Care | Payer: Medicare Other | Attending: Emergency Medicine | Admitting: Emergency Medicine

## 2021-07-02 ENCOUNTER — Other Ambulatory Visit: Payer: Self-pay

## 2021-07-02 ENCOUNTER — Encounter (HOSPITAL_COMMUNITY): Payer: Self-pay | Admitting: *Deleted

## 2021-07-02 DIAGNOSIS — F840 Autistic disorder: Secondary | ICD-10-CM | POA: Diagnosis not present

## 2021-07-02 DIAGNOSIS — R519 Headache, unspecified: Secondary | ICD-10-CM | POA: Diagnosis present

## 2021-07-02 DIAGNOSIS — G44209 Tension-type headache, unspecified, not intractable: Secondary | ICD-10-CM | POA: Insufficient documentation

## 2021-07-02 DIAGNOSIS — F17210 Nicotine dependence, cigarettes, uncomplicated: Secondary | ICD-10-CM | POA: Diagnosis not present

## 2021-07-02 LAB — COMPREHENSIVE METABOLIC PANEL
ALT: 13 U/L (ref 0–44)
AST: 17 U/L (ref 15–41)
Albumin: 4 g/dL (ref 3.5–5.0)
Alkaline Phosphatase: 50 U/L (ref 38–126)
Anion gap: 10 (ref 5–15)
BUN: <5 mg/dL — ABNORMAL LOW (ref 6–20)
CO2: 27 mmol/L (ref 22–32)
Calcium: 9.6 mg/dL (ref 8.9–10.3)
Chloride: 101 mmol/L (ref 98–111)
Creatinine, Ser: 0.84 mg/dL (ref 0.61–1.24)
GFR, Estimated: >60 mL/min (ref >60)
Glucose, Bld: 93 mg/dL (ref 70–99)
Potassium: 4.1 mmol/L (ref 3.5–5.1)
Sodium: 138 mmol/L (ref 135–145)
Total Bilirubin: 0.4 mg/dL (ref 0.3–1.2)
Total Protein: 7.2 g/dL (ref 6.5–8.1)

## 2021-07-02 LAB — URINALYSIS, ROUTINE W REFLEX MICROSCOPIC
Bilirubin Urine: NEGATIVE
Glucose, UA: NEGATIVE mg/dL
Hgb urine dipstick: NEGATIVE
Ketones, ur: NEGATIVE mg/dL
Leukocytes,Ua: NEGATIVE
Nitrite: NEGATIVE
Protein, ur: NEGATIVE mg/dL
Specific Gravity, Urine: 1.002 — ABNORMAL LOW (ref 1.005–1.030)
pH: 8 (ref 5.0–8.0)

## 2021-07-02 LAB — CBC
HCT: 42.8 % (ref 39.0–52.0)
Hemoglobin: 14.8 g/dL (ref 13.0–17.0)
MCH: 30.7 pg (ref 26.0–34.0)
MCHC: 34.6 g/dL (ref 30.0–36.0)
MCV: 88.8 fL (ref 80.0–100.0)
Platelets: 242 10*3/uL (ref 150–400)
RBC: 4.82 MIL/uL (ref 4.22–5.81)
RDW: 11.9 % (ref 11.5–15.5)
WBC: 3.6 10*3/uL — ABNORMAL LOW (ref 4.0–10.5)
nRBC: 0 % (ref 0.0–0.2)

## 2021-07-02 MED ORDER — DIPHENHYDRAMINE HCL 12.5 MG/5ML PO ELIX
12.5000 mg | ORAL_SOLUTION | Freq: Once | ORAL | Status: AC
  Administered 2021-07-02: 12.5 mg via ORAL
  Filled 2021-07-02: qty 10

## 2021-07-02 MED ORDER — PROCHLORPERAZINE MALEATE 5 MG PO TABS
5.0000 mg | ORAL_TABLET | Freq: Once | ORAL | Status: AC
  Administered 2021-07-02: 5 mg via ORAL
  Filled 2021-07-02: qty 1

## 2021-07-02 MED ORDER — ACETAMINOPHEN 325 MG PO TABS
650.0000 mg | ORAL_TABLET | Freq: Once | ORAL | Status: AC
  Administered 2021-07-02: 650 mg via ORAL
  Filled 2021-07-02: qty 2

## 2021-07-02 NOTE — ED Notes (Signed)
Called group home to let them know that pt was up for discharge , they said they would not be able to come get him until the morning

## 2021-07-02 NOTE — ED Triage Notes (Signed)
The pt report that he has a headache nad that he is seeing things that are not real  he repeats  questions asked and has difficulty answering questions

## 2021-07-02 NOTE — Discharge Instructions (Addendum)
Alexander Fritz was seen and evaluated in our emergency department for headache.  Patient was provided with oral pain med and headache cocktail.  Given that the patient has no active suicidality, no plans to hurt himself or others, and is not being debilitated by his delusions I feel as if he is stable for discharge home to his group home.  Additionally the patient showed no evidence of hostility.  Please follow-up for additional complaints consistent with this that are [facility].  Information provided in this discharge packet.

## 2021-07-02 NOTE — ED Provider Notes (Signed)
[facility]-EMERGENCY DEPT [facility] Emergency Department Provider Note MRN:  [id number]  Arrival date & time: 07/03/21     Chief Complaint   Headache   History of Present Illness   Alexander Fritz is a 31 y.o. year-old male with a history of autism and bipolar disorder presenting to the ED with chief complaint of headache.  History obtained from the patient as well as his group home provider. Patient reports that he has had a generalized headache today.  She states that this headache is not the worst headache that he has ever felt.  Patient states that he has had no weakness numbness neck stiffness, fever or chills.  Patient has not did any therapies to relieve his headache. Patient states that he has had mild hallucinations.  Patient states that he sees all the colors of the rainbow's.  Patient states he has been able to care for himself though.  Denies SI, HI, auditory hallucinations.  Group home representative stated that the patient typically gets sent to the hospital when he starts to experience hallucinations.  Given that the patient was seen because today they sent him to the hospital for further evaluation.  Group home provider had no concerns from infection or headache perspective.    Review of Systems  A thorough review of systems was obtained and all systems are negative except as noted in the HPI and PMH.   Patient's Health History    Past Medical History:  Diagnosis Date   Autism    Bipolar 1 disorder (HCC)     History reviewed. No pertinent surgical history.  No family history on file.  Social History   Socioeconomic History   Marital status: Single    Spouse name: Not on file   Number of children: Not on file   Years of education: Not on file   Highest education level: Not on file  Occupational History   Not on file  Tobacco Use   Smoking status: Every Day    Packs/day: 0.50    Years: 5.00    Pack years: 2.50    Types: Cigarettes   Smokeless tobacco:  Former  Building services engineer Use: Never used  Substance and Sexual Activity   Alcohol use: No   Drug use: No   Sexual activity: Not Currently  Other Topics Concern   Not on file  Social History Narrative   Not on file   Social Determinants of Health   Financial Resource Strain: Not on file  Food Insecurity: Not on file  Transportation Needs: Not on file  Physical Activity: Not on file  Stress: Not on file  Social Connections: Not on file  Intimate Partner Violence: Not on file     Physical Exam   Physical Exam Vitals and nursing note reviewed.  Constitutional:      Appearance: Normal appearance.  Cardiovascular:     Rate and Rhythm: Normal rate and regular rhythm.  Pulmonary:     Effort: Pulmonary effort is normal.     Breath sounds: Normal breath sounds.  Abdominal:     General: Abdomen is flat.     Palpations: Abdomen is soft.  Neurological:     General: No focal deficit present.     Mental Status: He is alert. Mental status is at baseline.     Cranial Nerves: No cranial nerve deficit.     Sensory: No sensory deficit.     Motor: No weakness.  Psychiatric:  Attention and Perception: He is inattentive. He perceives visual hallucinations.        Mood and Affect: Affect is flat.        Speech: Speech normal.        Behavior: Behavior is cooperative.        Thought Content: Thought content is delusional.      Diagnostic and Interventional Summary    Labs Reviewed  COMPREHENSIVE METABOLIC PANEL - Abnormal; Notable for the following components:      Result Value   BUN <5 (*)    All other components within normal limits  CBC - Abnormal; Notable for the following components:   WBC 3.6 (*)    All other components within normal limits  URINALYSIS, ROUTINE W REFLEX MICROSCOPIC - Abnormal; Notable for the following components:   Color, Urine COLORLESS (*)    Specific Gravity, Urine 1.002 (*)    All other components within normal limits    No orders to  display    Medications  acetaminophen (TYLENOL) tablet 650 mg (650 mg Oral Given 07/02/21 2104)  diphenhydrAMINE (BENADRYL) 12.5 MG/5ML elixir 12.5 mg (12.5 mg Oral Given 07/02/21 2104)  prochlorperazine (COMPAZINE) tablet 5 mg (5 mg Oral Given 07/02/21 2104)     Procedures  /  Critical Care Procedures  ED Course and Medical Decision Making  Initial Impression and Ddx 31 year old male presents to emergency department for evaluation of headaches. Differential diagnosis includes but is not limited to the following: Tension headache, cluster headache, migraine headache, meningitis.  No neck stiffness to suggest meningitis.  Based off of history and physical exam suspect that the patient is suffering from tension headache.  No focal deficits to suggest intracranial abnormality.  We will provide the patient with Tylenol, Benadryl, and Compazine.  Past medical/surgical history that increases complexity of ED encounter: Autism and bipolar disorder  Interpretation of Diagnostics I personally reviewed the Cardiac Monitor and my interpretation is as follows: Normal sinus rhythm cardiac monitor.    No leukocytosis or anemia noted on CBC.  Urinalysis without signs of infection.  Metabolic panel within normal notes.  Patient Reassessment and Ultimate Disposition/Management Patient was reassessed and stated that he was without headache at this time.  Given that he is still without suicidality, no homicidal thoughts, and has minimal hallucinations at this time is feel as if he is stable for discharge home to his group home.  Patient management required discussion with the following services or consulting groups:  None  Complexity of Problems Addressed Acute complicated illness or Injury  Additional Data Reviewed and Analyzed Further history obtained from: Recent PCP notes and Recent Consult notes  Factors Impacting ED Encounter Risk Consideration of hospitalization    Final Clinical  Impressions(s) / ED Diagnoses     ICD-10-CM   1. Tension-type headache, not intractable, unspecified chronicity pattern  G44.209       ED Discharge Orders     None        Discharge Instructions Discussed with and Provided to Patient:     Discharge Instructions      Alexander Fritz was seen and evaluated in our emergency department for headache.  Patient was provided with oral pain med and headache cocktail.  Given that the patient has no active suicidality, no plans to hurt himself or others, and is not being debilitated by his delusions I feel as if he is stable for discharge home to his group home.  Additionally the patient showed no evidence of hostility.  Please follow-up for additional complaints consistent with this that are [facility].  Information provided in this discharge packet.        Camila Li, MD 07/03/21 Salley Hews    Derwood Kaplan, MD 07/04/21 0003

## 2022-12-24 ENCOUNTER — Inpatient Hospital Stay (HOSPITAL_COMMUNITY)
Admission: EM | Admit: 2022-12-24 | Discharge: 2023-02-14 | DRG: 003 | Disposition: A | Discharge status: Skilled Nursing Facility | Payer: 59 | Attending: Surgery | Admitting: Surgery

## 2022-12-24 ENCOUNTER — Inpatient Hospital Stay (HOSPITAL_COMMUNITY): Payer: 59 | Admitting: Anesthesiology

## 2022-12-24 ENCOUNTER — Inpatient Hospital Stay (HOSPITAL_COMMUNITY): Admission: EM | Disposition: A | Discharge status: Skilled Nursing Facility | Payer: Self-pay | Source: Home / Self Care

## 2022-12-24 ENCOUNTER — Emergency Department (HOSPITAL_COMMUNITY): Payer: 59

## 2022-12-24 DIAGNOSIS — S065X0A Traumatic subdural hemorrhage without loss of consciousness, initial encounter: Secondary | ICD-10-CM

## 2022-12-24 DIAGNOSIS — S0181XA Laceration without foreign body of other part of head, initial encounter: Principal | ICD-10-CM

## 2022-12-24 DIAGNOSIS — S069XAA Unspecified intracranial injury with loss of consciousness status unknown, initial encounter: Secondary | ICD-10-CM | POA: Diagnosis present

## 2022-12-24 DIAGNOSIS — S06359A Traumatic hemorrhage of left cerebrum with loss of consciousness of unspecified duration, initial encounter: Secondary | ICD-10-CM | POA: Diagnosis present

## 2022-12-24 DIAGNOSIS — R402433 Glasgow coma scale score 3-8, at hospital admission: Secondary | ICD-10-CM | POA: Diagnosis present

## 2022-12-24 DIAGNOSIS — Y92410 Unspecified street and highway as the place of occurrence of the external cause: Secondary | ICD-10-CM

## 2022-12-24 DIAGNOSIS — S27321A Contusion of lung, unilateral, initial encounter: Secondary | ICD-10-CM | POA: Diagnosis present

## 2022-12-24 DIAGNOSIS — S066XAA Traumatic subarachnoid hemorrhage with loss of consciousness status unknown, initial encounter: Secondary | ICD-10-CM | POA: Diagnosis present

## 2022-12-24 DIAGNOSIS — S01111A Laceration without foreign body of right eyelid and periocular area, initial encounter: Secondary | ICD-10-CM | POA: Diagnosis present

## 2022-12-24 DIAGNOSIS — S82201A Unspecified fracture of shaft of right tibia, initial encounter for closed fracture: Secondary | ICD-10-CM | POA: Diagnosis present

## 2022-12-24 DIAGNOSIS — G259 Extrapyramidal and movement disorder, unspecified: Secondary | ICD-10-CM | POA: Diagnosis present

## 2022-12-24 DIAGNOSIS — J9601 Acute respiratory failure with hypoxia: Secondary | ICD-10-CM | POA: Diagnosis present

## 2022-12-24 DIAGNOSIS — S06349A Traumatic hemorrhage of right cerebrum with loss of consciousness of unspecified duration, initial encounter: Secondary | ICD-10-CM | POA: Diagnosis present

## 2022-12-24 DIAGNOSIS — T794XXA Traumatic shock, initial encounter: Secondary | ICD-10-CM | POA: Diagnosis present

## 2022-12-24 DIAGNOSIS — S065XAA Traumatic subdural hemorrhage with loss of consciousness status unknown, initial encounter: Secondary | ICD-10-CM | POA: Diagnosis present

## 2022-12-24 DIAGNOSIS — D62 Acute posthemorrhagic anemia: Secondary | ICD-10-CM | POA: Diagnosis present

## 2022-12-24 DIAGNOSIS — Z23 Encounter for immunization: Secondary | ICD-10-CM | POA: Diagnosis present

## 2022-12-24 DIAGNOSIS — F840 Autistic disorder: Secondary | ICD-10-CM | POA: Diagnosis present

## 2022-12-24 DIAGNOSIS — S37011A Minor contusion of right kidney, initial encounter: Secondary | ICD-10-CM | POA: Diagnosis present

## 2022-12-24 DIAGNOSIS — D696 Thrombocytopenia, unspecified: Secondary | ICD-10-CM | POA: Diagnosis not present

## 2022-12-24 DIAGNOSIS — F319 Bipolar disorder, unspecified: Secondary | ICD-10-CM | POA: Diagnosis present

## 2022-12-24 DIAGNOSIS — R339 Retention of urine, unspecified: Secondary | ICD-10-CM | POA: Diagnosis not present

## 2022-12-24 DIAGNOSIS — S32591A Other specified fracture of right pubis, initial encounter for closed fracture: Secondary | ICD-10-CM | POA: Diagnosis present

## 2022-12-24 DIAGNOSIS — S061X9A Traumatic cerebral edema with loss of consciousness of unspecified duration, initial encounter: Secondary | ICD-10-CM | POA: Diagnosis present

## 2022-12-24 DIAGNOSIS — S3722XA Contusion of bladder, initial encounter: Secondary | ICD-10-CM | POA: Diagnosis present

## 2022-12-24 DIAGNOSIS — S32512A Fracture of superior rim of left pubis, initial encounter for closed fracture: Secondary | ICD-10-CM | POA: Diagnosis present

## 2022-12-24 DIAGNOSIS — R000 Tachycardia, unspecified: Secondary | ICD-10-CM | POA: Diagnosis present

## 2022-12-24 DIAGNOSIS — S2241XA Multiple fractures of ribs, right side, initial encounter for closed fracture: Secondary | ICD-10-CM | POA: Diagnosis present

## 2022-12-24 DIAGNOSIS — Z79899 Other long term (current) drug therapy: Secondary | ICD-10-CM

## 2022-12-24 DIAGNOSIS — S8261XA Displaced fracture of lateral malleolus of right fibula, initial encounter for closed fracture: Secondary | ICD-10-CM | POA: Diagnosis present

## 2022-12-24 DIAGNOSIS — S2220XA Unspecified fracture of sternum, initial encounter for closed fracture: Secondary | ICD-10-CM | POA: Diagnosis present

## 2022-12-24 DIAGNOSIS — F79 Unspecified intellectual disabilities: Secondary | ICD-10-CM | POA: Diagnosis present

## 2022-12-24 DIAGNOSIS — Z87820 Personal history of traumatic brain injury: Secondary | ICD-10-CM | POA: Diagnosis present

## 2022-12-24 DIAGNOSIS — G936 Cerebral edema: Secondary | ICD-10-CM | POA: Diagnosis not present

## 2022-12-24 HISTORY — PX: CRANIOTOMY: SHX93

## 2022-12-24 LAB — PREPARE RBC (CROSSMATCH)

## 2022-12-24 LAB — URINALYSIS, ROUTINE W REFLEX MICROSCOPIC
Bilirubin Urine: NEGATIVE
Glucose, UA: NEGATIVE mg/dL
Ketones, ur: NEGATIVE mg/dL
Leukocytes,Ua: NEGATIVE
Nitrite: NEGATIVE
Protein, ur: 30 mg/dL — AB
RBC / HPF: >50 RBC/hpf (ref 0–5)
Specific Gravity, Urine: 1.034 — ABNORMAL HIGH (ref 1.005–1.030)
pH: 5 (ref 5.0–8.0)

## 2022-12-24 LAB — COMPREHENSIVE METABOLIC PANEL
ALT: 50 U/L — ABNORMAL HIGH (ref 0–44)
AST: 76 U/L — ABNORMAL HIGH (ref 15–41)
Albumin: 2.6 g/dL — ABNORMAL LOW (ref 3.5–5.0)
Alkaline Phosphatase: 42 U/L (ref 38–126)
Anion gap: 11 (ref 5–15)
BUN: 12 mg/dL (ref 6–20)
CO2: 24 mmol/L (ref 22–32)
Calcium: 7.4 mg/dL — ABNORMAL LOW (ref 8.9–10.3)
Chloride: 105 mmol/L (ref 98–111)
Creatinine, Ser: 1.08 mg/dL (ref 0.61–1.24)
GFR, Estimated: >60 mL/min (ref >60)
Glucose, Bld: 235 mg/dL — ABNORMAL HIGH (ref 70–99)
Potassium: 3.2 mmol/L — ABNORMAL LOW (ref 3.5–5.1)
Sodium: 140 mmol/L (ref 135–145)
Total Bilirubin: 0.8 mg/dL (ref 0.3–1.2)
Total Protein: 4.6 g/dL — ABNORMAL LOW (ref 6.5–8.1)

## 2022-12-24 LAB — PROTIME-INR
INR: 1.6 — ABNORMAL HIGH (ref 0.8–1.2)
Prothrombin Time: 19.5 seconds — ABNORMAL HIGH (ref 11.4–15.2)

## 2022-12-24 LAB — CBC
HCT: 26.3 % — ABNORMAL LOW (ref 39.0–52.0)
Hemoglobin: 9.2 g/dL — ABNORMAL LOW (ref 13.0–17.0)
MCH: 29.9 pg (ref 26.0–34.0)
MCHC: 35 g/dL (ref 30.0–36.0)
MCV: 85.4 fL (ref 80.0–100.0)
Platelets: 117 10*3/uL — ABNORMAL LOW (ref 150–400)
RBC: 3.08 MIL/uL — ABNORMAL LOW (ref 4.22–5.81)
RDW: 13 % (ref 11.5–15.5)
WBC: 9 10*3/uL (ref 4.0–10.5)
nRBC: 0 % (ref 0.0–0.2)

## 2022-12-24 LAB — I-STAT CHEM 8, ED
BUN: 14 mg/dL (ref 6–20)
Calcium, Ion: 1.03 mmol/L — ABNORMAL LOW (ref 1.15–1.40)
Chloride: 105 mmol/L (ref 98–111)
Creatinine, Ser: 1.1 mg/dL (ref 0.61–1.24)
Glucose, Bld: 129 mg/dL — ABNORMAL HIGH (ref 70–99)
HCT: 41 % (ref 39.0–52.0)
Hemoglobin: 13.9 g/dL (ref 13.0–17.0)
Potassium: 3.5 mmol/L (ref 3.5–5.1)
Sodium: 141 mmol/L (ref 135–145)
TCO2: 23 mmol/L (ref 22–32)

## 2022-12-24 LAB — I-STAT CG4 LACTIC ACID, ED: Lactic Acid, Venous: 4.9 mmol/L (ref 0.5–1.9)

## 2022-12-24 LAB — HIV ANTIBODY (ROUTINE TESTING W REFLEX): HIV Screen 4th Generation wRfx: NONREACTIVE

## 2022-12-24 LAB — MRSA NEXT GEN BY PCR, NASAL: MRSA by PCR Next Gen: NOT DETECTED

## 2022-12-24 LAB — ETHANOL: Alcohol, Ethyl (B): <10 mg/dL (ref <10)

## 2022-12-24 SURGERY — CRANIOTOMY HEMATOMA EVACUATION SUBDURAL
Anesthesia: General | Site: Head | Laterality: Left

## 2022-12-24 MED ORDER — TETANUS-DIPHTH-ACELL PERTUSSIS 5-2.5-18.5 LF-MCG/0.5 IM SUSY
0.5000 mL | PREFILLED_SYRINGE | Freq: Once | INTRAMUSCULAR | Status: AC
  Administered 2022-12-24: 0.5 mL via INTRAMUSCULAR

## 2022-12-24 MED ORDER — LIDOCAINE-EPINEPHRINE 1 %-1:100000 IJ SOLN
INTRAMUSCULAR | Status: AC
  Filled 2022-12-24: qty 1

## 2022-12-24 MED ORDER — PANTOPRAZOLE SODIUM 40 MG IV SOLR: 40.0000 mg | Freq: Every day | INTRAVENOUS | Status: DC

## 2022-12-24 MED ORDER — THROMBIN 20000 UNITS EX SOLR
CUTANEOUS | Status: AC
  Filled 2022-12-24: qty 20000

## 2022-12-24 MED ORDER — ROCURONIUM BROMIDE 10 MG/ML (PF) SYRINGE
PREFILLED_SYRINGE | INTRAVENOUS | Status: DC | PRN
  Administered 2022-12-24 (×2): 50 mg via INTRAVENOUS

## 2022-12-24 MED ORDER — DEXAMETHASONE SODIUM PHOSPHATE 10 MG/ML IJ SOLN
INTRAMUSCULAR | Status: DC | PRN
  Administered 2022-12-24: 10 mg via INTRAVENOUS

## 2022-12-24 MED ORDER — IOHEXOL 350 MG/ML SOLN
75.0000 mL | Freq: Once | INTRAVENOUS | Status: AC | PRN
  Administered 2022-12-24: 75 mL via INTRAVENOUS

## 2022-12-24 MED ORDER — BUPIVACAINE-EPINEPHRINE (PF) 0.5% -1:200000 IJ SOLN
INTRAMUSCULAR | Status: AC
  Filled 2022-12-24: qty 30

## 2022-12-24 MED ORDER — FENTANYL 2500MCG IN NS 250ML (10MCG/ML) PREMIX INFUSION: 50.0000 ug/h | INTRAVENOUS | Status: DC

## 2022-12-24 MED ORDER — POLYETHYLENE GLYCOL 3350 17 G PO PACK
17.0000 g | PACK | Freq: Every day | ORAL | Status: DC
  Administered 2022-12-25 – 2023-01-01 (×7): 17 g
  Filled 2022-12-24 (×8): qty 1

## 2022-12-24 MED ORDER — PROMETHAZINE HCL 25 MG PO TABS: 12.5000 mg | ORAL_TABLET | ORAL | Status: DC | PRN

## 2022-12-24 MED ORDER — DOCUSATE SODIUM 50 MG/5ML PO LIQD
100.0000 mg | Freq: Two times a day (BID) | ORAL | Status: DC
  Administered 2022-12-25 – 2023-01-01 (×15): 100 mg
  Filled 2022-12-24 (×16): qty 10

## 2022-12-24 MED ORDER — METOPROLOL TARTRATE 5 MG/5ML IV SOLN
5.0000 mg | Freq: Four times a day (QID) | INTRAVENOUS | Status: DC | PRN
  Administered 2023-01-02: 5 mg via INTRAVENOUS
  Filled 2022-12-24 (×2): qty 5

## 2022-12-24 MED ORDER — BACITRACIN ZINC 500 UNIT/GM EX OINT
TOPICAL_OINTMENT | CUTANEOUS | Status: AC
  Filled 2022-12-24: qty 28.35

## 2022-12-24 MED ORDER — FENTANYL CITRATE (PF) 250 MCG/5ML IJ SOLN
INTRAMUSCULAR | Status: DC | PRN
  Administered 2022-12-24: 100 ug via INTRAVENOUS

## 2022-12-24 MED ORDER — DOCUSATE SODIUM 50 MG/5ML PO LIQD: 100.0000 mg | Freq: Two times a day (BID) | ORAL | Status: DC

## 2022-12-24 MED ORDER — THROMBIN 20000 UNITS EX SOLR: CUTANEOUS | Status: DC | PRN

## 2022-12-24 MED ORDER — METHOCARBAMOL 1000 MG/10ML IJ SOLN
500.0000 mg | Freq: Three times a day (TID) | INTRAVENOUS | Status: AC
  Administered 2022-12-25: 500 mg via INTRAVENOUS
  Filled 2022-12-24: qty 500

## 2022-12-24 MED ORDER — PANTOPRAZOLE SODIUM 40 MG IV SOLR
40.0000 mg | Freq: Every day | INTRAVENOUS | Status: DC
  Administered 2022-12-24 – 2023-02-13 (×52): 40 mg via INTRAVENOUS
  Filled 2022-12-24 (×53): qty 10

## 2022-12-24 MED ORDER — MIDAZOLAM HCL 2 MG/2ML IJ SOLN
2.0000 mg | Freq: Once | INTRAMUSCULAR | Status: AC
  Administered 2022-12-24: 2 mg via INTRAVENOUS

## 2022-12-24 MED ORDER — PROPOFOL 1000 MG/100ML IV EMUL
0.0000 ug/kg/min | INTRAVENOUS | Status: DC
  Filled 2022-12-24 (×2): qty 100

## 2022-12-24 MED ORDER — LACTATED RINGERS IV SOLN: INTRAVENOUS | Status: DC | PRN

## 2022-12-24 MED ORDER — ORAL CARE MOUTH RINSE
15.0000 mL | OROMUCOSAL | Status: DC | PRN
  Administered 2023-01-08 – 2023-01-10 (×4): 15 mL via OROMUCOSAL

## 2022-12-24 MED ORDER — LEVETIRACETAM IN NACL 500 MG/100ML IV SOLN
500.0000 mg | Freq: Two times a day (BID) | INTRAVENOUS | Status: DC
  Administered 2022-12-24 – 2023-01-06 (×26): 500 mg via INTRAVENOUS
  Filled 2022-12-24 (×26): qty 100

## 2022-12-24 MED ORDER — SODIUM CHLORIDE 0.9 % IV SOLN: INTRAVENOUS | Status: DC

## 2022-12-24 MED ORDER — ONDANSETRON HCL 4 MG PO TABS: 4.0000 mg | ORAL_TABLET | ORAL | Status: DC | PRN

## 2022-12-24 MED ORDER — ETOMIDATE 2 MG/ML IV SOLN
INTRAVENOUS | Status: AC | PRN
Start: 2022-12-24 — End: 2022-12-24
  Administered 2022-12-24: 20 mg via INTRAVENOUS

## 2022-12-24 MED ORDER — SUCCINYLCHOLINE CHLORIDE 20 MG/ML IJ SOLN
INTRAMUSCULAR | Status: AC | PRN
  Administered 2022-12-24: 140 mg via INTRAVENOUS

## 2022-12-24 MED ORDER — FENTANYL CITRATE (PF) 250 MCG/5ML IJ SOLN
INTRAMUSCULAR | Status: AC
  Filled 2022-12-24: qty 5

## 2022-12-24 MED ORDER — ONDANSETRON 4 MG PO TBDP: 4.0000 mg | ORAL_TABLET | Freq: Four times a day (QID) | ORAL | Status: DC | PRN

## 2022-12-24 MED ORDER — PANTOPRAZOLE SODIUM 40 MG PO TBEC: 40.0000 mg | DELAYED_RELEASE_TABLET | Freq: Every day | ORAL | Status: DC

## 2022-12-24 MED ORDER — ONDANSETRON HCL 4 MG/2ML IJ SOLN
4.0000 mg | Freq: Four times a day (QID) | INTRAMUSCULAR | Status: DC | PRN
  Filled 2022-12-24 (×4): qty 2

## 2022-12-24 MED ORDER — CEFAZOLIN SODIUM-DEXTROSE 2-4 GM/100ML-% IV SOLN
2.0000 g | Freq: Once | INTRAVENOUS | Status: AC
  Administered 2022-12-24: 2 g via INTRAVENOUS
  Filled 2022-12-24: qty 100

## 2022-12-24 MED ORDER — ACETAMINOPHEN 500 MG PO TABS
1000.0000 mg | ORAL_TABLET | Freq: Four times a day (QID) | ORAL | Status: DC
  Administered 2022-12-24 – 2023-02-14 (×193): 1000 mg
  Filled 2022-12-24 (×200): qty 2

## 2022-12-24 MED ORDER — THROMBIN 5000 UNITS EX SOLR
CUTANEOUS | Status: AC
  Filled 2022-12-24: qty 5000

## 2022-12-24 MED ORDER — CHLORHEXIDINE GLUCONATE CLOTH 2 % EX PADS
6.0000 | MEDICATED_PAD | Freq: Every day | CUTANEOUS | Status: DC
  Administered 2022-12-24 – 2023-01-06 (×16): 6 via TOPICAL

## 2022-12-24 MED ORDER — METHOCARBAMOL 500 MG PO TABS
500.0000 mg | ORAL_TABLET | Freq: Three times a day (TID) | ORAL | Status: AC
  Administered 2022-12-24 – 2022-12-27 (×8): 500 mg
  Filled 2022-12-24 (×8): qty 1

## 2022-12-24 MED ORDER — PROPOFOL 10 MG/ML IV BOLUS
INTRAVENOUS | Status: AC
  Filled 2022-12-24: qty 20

## 2022-12-24 MED ORDER — MIDAZOLAM HCL 2 MG/2ML IJ SOLN
INTRAMUSCULAR | Status: DC | PRN
  Administered 2022-12-24: 2 mg via INTRAVENOUS

## 2022-12-24 MED ORDER — LIDOCAINE-EPINEPHRINE 1 %-1:100000 IJ SOLN
INTRAMUSCULAR | Status: DC | PRN
  Administered 2022-12-24: 9.5 mL via INTRADERMAL

## 2022-12-24 MED ORDER — MIDAZOLAM HCL 2 MG/2ML IJ SOLN
INTRAMUSCULAR | Status: AC
  Filled 2022-12-24: qty 2

## 2022-12-24 MED ORDER — PROPOFOL 500 MG/50ML IV EMUL
INTRAVENOUS | Status: DC | PRN
  Administered 2022-12-24: 50 ug/kg/min via INTRAVENOUS

## 2022-12-24 MED ORDER — HYDRALAZINE HCL 20 MG/ML IJ SOLN: 10.0000 mg | INTRAMUSCULAR | Status: DC | PRN

## 2022-12-24 MED ORDER — PROPOFOL 1000 MG/100ML IV EMUL
0.0000 ug/kg/min | INTRAVENOUS | Status: DC
  Administered 2022-12-24: 10 ug/kg/min via INTRAVENOUS
  Administered 2022-12-25 (×2): 40 ug/kg/min via INTRAVENOUS

## 2022-12-24 MED ORDER — THROMBIN 5000 UNITS EX SOLR: OROMUCOSAL | Status: DC | PRN

## 2022-12-24 MED ORDER — POLYETHYLENE GLYCOL 3350 17 G PO PACK
17.0000 g | PACK | Freq: Every day | ORAL | Status: DC | PRN
  Administered 2023-01-24 – 2023-02-07 (×5): 17 g
  Filled 2022-12-24 (×6): qty 1

## 2022-12-24 MED ORDER — FENTANYL BOLUS VIA INFUSION: 50.0000 ug | INTRAVENOUS | Status: DC | PRN

## 2022-12-24 MED ORDER — FENTANYL 2500MCG IN NS 250ML (10MCG/ML) PREMIX INFUSION
50.0000 ug/h | INTRAVENOUS | Status: DC
  Administered 2022-12-24: 50 ug/h via INTRAVENOUS
  Filled 2022-12-24 (×2): qty 250

## 2022-12-24 MED ORDER — FENTANYL CITRATE PF 50 MCG/ML IJ SOSY: 50.0000 ug | PREFILLED_SYRINGE | Freq: Once | INTRAMUSCULAR | Status: DC

## 2022-12-24 MED ORDER — PHENYLEPHRINE HCL-NACL 20-0.9 MG/250ML-% IV SOLN
INTRAVENOUS | Status: DC | PRN
  Administered 2022-12-24: 30 ug/min via INTRAVENOUS

## 2022-12-24 MED ORDER — PHENYLEPHRINE 80 MCG/ML (10ML) SYRINGE FOR IV PUSH (FOR BLOOD PRESSURE SUPPORT)
PREFILLED_SYRINGE | INTRAVENOUS | Status: DC | PRN
  Administered 2022-12-24 (×3): 80 ug via INTRAVENOUS

## 2022-12-24 MED ORDER — CEFAZOLIN SODIUM-DEXTROSE 1-4 GM/50ML-% IV SOLN
1.0000 g | Freq: Three times a day (TID) | INTRAVENOUS | Status: AC
  Administered 2022-12-25 (×2): 1 g via INTRAVENOUS
  Filled 2022-12-24 (×2): qty 50

## 2022-12-24 MED ORDER — SODIUM CHLORIDE 0.9% IV SOLUTION: Freq: Once | INTRAVENOUS | Status: DC

## 2022-12-24 MED ORDER — BUPIVACAINE-EPINEPHRINE (PF) 0.5% -1:200000 IJ SOLN
INTRAMUSCULAR | Status: DC | PRN
  Administered 2022-12-24: 9.5 mL via PERINEURAL

## 2022-12-24 MED ORDER — LABETALOL HCL 5 MG/ML IV SOLN
10.0000 mg | INTRAVENOUS | Status: DC | PRN
  Administered 2022-12-25: 10 mg via INTRAVENOUS
  Filled 2022-12-24: qty 4

## 2022-12-24 MED ORDER — ORAL CARE MOUTH RINSE
15.0000 mL | OROMUCOSAL | Status: DC
  Administered 2022-12-24 – 2023-01-08 (×177): 15 mL via OROMUCOSAL

## 2022-12-24 MED ORDER — ONDANSETRON HCL 4 MG/2ML IJ SOLN
4.0000 mg | INTRAMUSCULAR | Status: DC | PRN
  Administered 2023-01-05 – 2023-01-09 (×3): 4 mg via INTRAVENOUS

## 2022-12-24 MED ORDER — 0.9 % SODIUM CHLORIDE (POUR BTL) OPTIME
TOPICAL | Status: DC | PRN
  Administered 2022-12-24 (×3): 1000 mL

## 2022-12-24 SURGICAL SUPPLY — 80 items
BAG COUNTER SPONGE SURGICOUNT (BAG) ×1 IMPLANT
BAG SPNG CNTER NS LX DISP (BAG) ×1
BIT DRILL WIRE PASS 1.3MM (BIT) IMPLANT
BLADE CLIPPER SURG (BLADE) ×1 IMPLANT
BUR CARBIDE MATCH 3.0 (BURR) ×1 IMPLANT
BUR SPIRAL ROUTER 2.3 (BUR) ×1 IMPLANT
CANISTER SUCT 3000ML PPV (MISCELLANEOUS) ×1 IMPLANT
CNTNR URN SCR LID CUP LEK RST (MISCELLANEOUS) IMPLANT
CONT SPEC 4OZ STRL OR WHT (MISCELLANEOUS) ×1
COVER BURR HOLE 14 (Orthopedic Implant) IMPLANT
DRAIN CHANNEL 10M FLAT 3/4 FLT (DRAIN) IMPLANT
DRAIN JACKSON PRATT 10MM FLAT (MISCELLANEOUS) IMPLANT
DRAIN JACKSON RD 7FR 3/32 (WOUND CARE) IMPLANT
DRAIN JP 10F RND RADIO (DRAIN) IMPLANT
DRAIN RELI 100 BL SUC LF ST (DRAIN) ×1
DRAPE NEUROLOGICAL W/INCISE (DRAPES) ×1 IMPLANT
DRAPE SHEET LG 3/4 BI-LAMINATE (DRAPES) ×1 IMPLANT
DRAPE SURG 17X23 STRL (DRAPES) IMPLANT
DRAPE WARM FLUID 44X44 (DRAPES) ×1 IMPLANT
DRILL WIRE PASS 1.3MM (BIT)
DRSG AQUACEL AG ADV 3.5X 6 (GAUZE/BANDAGES/DRESSINGS) IMPLANT
DRSG AQUACEL AG ADV 3.5X10 (GAUZE/BANDAGES/DRESSINGS) IMPLANT
DURAPREP 6ML APPLICATOR 50/CS (WOUND CARE) ×1 IMPLANT
ELECT COATED BLADE 2.86 ST (ELECTRODE) ×1 IMPLANT
ELECT REM PT RETURN 9FT ADLT (ELECTROSURGICAL) ×1
ELECTRODE REM PT RTRN 9FT ADLT (ELECTROSURGICAL) ×1 IMPLANT
EVACUATOR SILICONE 100CC (DRAIN) IMPLANT
FORCEPS BIPO MALIS IRRIG 9X1.5 (NEUROSURGERY SUPPLIES) ×1 IMPLANT
GAUZE 4X4 16PLY ~~LOC~~+RFID DBL (SPONGE) IMPLANT
GAUZE SPONGE 4X4 12PLY STRL (GAUZE/BANDAGES/DRESSINGS) IMPLANT
GLOVE BIO SURGEON STRL SZ7 (GLOVE) ×1 IMPLANT
GLOVE BIOGEL PI IND STRL 7.5 (GLOVE) ×1 IMPLANT
GLOVE BIOGEL PI IND STRL 8 (GLOVE) ×2 IMPLANT
GLOVE ECLIPSE 8.0 STRL XLNG CF (GLOVE) ×2 IMPLANT
GLOVE EXAM NITRILE LRG STRL (GLOVE) IMPLANT
GLOVE EXAM NITRILE XL STR (GLOVE) IMPLANT
GLOVE EXAM NITRILE XS STR PU (GLOVE) IMPLANT
GOWN STRL REUS W/ TWL LRG LVL3 (GOWN DISPOSABLE) IMPLANT
GOWN STRL REUS W/ TWL XL LVL3 (GOWN DISPOSABLE) ×2 IMPLANT
GOWN STRL REUS W/TWL 2XL LVL3 (GOWN DISPOSABLE) IMPLANT
GOWN STRL REUS W/TWL LRG LVL3 (GOWN DISPOSABLE)
GOWN STRL REUS W/TWL XL LVL3 (GOWN DISPOSABLE) ×2
GRAFT DURAGEN MATRIX 5WX7L (Graft) IMPLANT
HEMOSTAT POWDER KIT SURGIFOAM (HEMOSTASIS) ×1 IMPLANT
HEMOSTAT SURGICEL 2X14 (HEMOSTASIS) ×1 IMPLANT
IV NS 1000ML (IV SOLUTION) ×1
IV NS 1000ML BAXH (IV SOLUTION) ×1 IMPLANT
KIT BASIN OR (CUSTOM PROCEDURE TRAY) ×1 IMPLANT
KIT TURNOVER KIT B (KITS) ×1 IMPLANT
NDL HYPO 22X1.5 SAFETY MO (MISCELLANEOUS) ×1 IMPLANT
NEEDLE HYPO 22X1.5 SAFETY MO (MISCELLANEOUS) ×1 IMPLANT
NS IRRIG 1000ML POUR BTL (IV SOLUTION) ×1 IMPLANT
PACK BATTERY CMF DISP FOR DVR (ORTHOPEDIC DISPOSABLE SUPPLIES) IMPLANT
PACK CRANIOTOMY CUSTOM (CUSTOM PROCEDURE TRAY) ×1 IMPLANT
PATTIES SURGICAL .5 X.5 (GAUZE/BANDAGES/DRESSINGS) IMPLANT
PATTIES SURGICAL .5 X3 (DISPOSABLE) IMPLANT
PATTIES SURGICAL 1X1 (DISPOSABLE) IMPLANT
PERFORATOR LRG 14-11MM (BIT) ×1 IMPLANT
PLATE DOUBLE Y CMF 6H (Plate) IMPLANT
RETRACTOR LONE STAR DISPOSABLE (INSTRUMENTS) ×2 IMPLANT
SCREW UNIII AXS SD 1.5X4 (Screw) IMPLANT
SET TUBING IRRIGATION DISP (TUBING) ×1 IMPLANT
SPONGE NEURO XRAY DETECT 1X3 (DISPOSABLE) IMPLANT
SPONGE SURGIFOAM ABS GEL 100 (HEMOSTASIS) ×1 IMPLANT
STAPLER VISISTAT 35W (STAPLE) ×1 IMPLANT
STOCKINETTE 6 STRL (DRAPES) ×1 IMPLANT
STRIP CLOSURE SKIN 1/2X4 (GAUZE/BANDAGES/DRESSINGS) ×1 IMPLANT
SUT ETHILON 3 0 FSL (SUTURE) IMPLANT
SUT ETHILON 3 0 PS 1 (SUTURE) IMPLANT
SUT NURALON 4 0 TR CR/8 (SUTURE) ×3 IMPLANT
SUT VIC AB 0 CT1 18XCR BRD8 (SUTURE) ×1 IMPLANT
SUT VIC AB 0 CT1 8-18 (SUTURE) ×1
SUT VIC AB 2-0 CP2 18 (SUTURE) ×1 IMPLANT
SUT VICRYL RAPIDE 4/0 PS 2 (SUTURE) ×1 IMPLANT
TOWEL GREEN STERILE (TOWEL DISPOSABLE) ×1 IMPLANT
TOWEL GREEN STERILE FF (TOWEL DISPOSABLE) ×1 IMPLANT
TRAY FOLEY MTR SLVR 16FR STAT (SET/KITS/TRAYS/PACK) ×1 IMPLANT
TUBE CONNECTING 12X1/4 (SUCTIONS) ×1 IMPLANT
UNDERPAD 30X36 HEAVY ABSORB (UNDERPADS AND DIAPERS) ×1 IMPLANT
WATER STERILE IRR 1000ML POUR (IV SOLUTION) ×1 IMPLANT
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
## 2022-12-24 NOTE — Progress Notes (Signed)
Transported patient on vent yo OR with RN and ED tech, passed off to CRNA to be bagged to room for surgery. Took patients vent to 4N and plugged up outside 4N25 and report given to 4N RT. No complications
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
## 2022-12-24 NOTE — Anesthesia Postprocedure Evaluation (Signed)
Anesthesia Post Note  Patient: Alexander Fritz  Procedure(s) Performed: HEMICRANIOTOMY FOR EVACUATION OF SUBDURAL HEMATOMA (Left: Head)     Patient location during evaluation: SICU Anesthesia Type: General Level of consciousness: sedated Pain management: pain level controlled Vital Signs Assessment: post-procedure vital signs reviewed and stable Respiratory status: patient remains intubated per anesthesia plan Cardiovascular status: stable Postop Assessment: no apparent nausea or vomiting Anesthetic complications: no  No notable events documented.  Last Vitals:  Vitals:   12/24/22 1748 12/24/22 1750  BP:  123/81  Pulse: (!) 122 (!) 107  Resp: 18 (!) 25  Temp:    SpO2: 100% 93%    Last Pain:  Vitals:   12/24/22 1737  TempSrc: Bladder                 Abigaile Rossie,W. EDMOND
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
## 2022-12-24 NOTE — ED Notes (Signed)
C-collar removed per Dr Janee Morn
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
## 2022-12-24 NOTE — ED Notes (Signed)
This RN and Leota Sauers, RN wasted fentanyl in pyxis.
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
## 2022-12-24 NOTE — Anesthesia Procedure Notes (Signed)
Arterial Line Insertion Start/End8/08/2022 7:57 PM, 12/24/2022 8:02 PM Performed by: Gaynelle Adu, MD, anesthesiologist  Patient location: Pre-op. Preanesthetic checklist: patient identified, IV checked, site marked, risks and benefits discussed, surgical consent, monitors and equipment checked, pre-op evaluation, timeout performed and anesthesia consent Lidocaine 1% used for infiltration Right, radial was placed Catheter size: 20 G Hand hygiene performed , maximum sterile barriers used  and Seldinger technique used  Attempts: 1 Procedure performed without using ultrasound guided technique. Following insertion, dressing applied and Biopatch. Post procedure assessment: normal and unchanged  Post procedure complications: unsuccessful attempts and second provider assisted. Patient tolerated the procedure well with no immediate complications.

## 2022-12-24 NOTE — ED Notes (Signed)
Pt transported to CT with MD and Rns on portable monitor.

## 2022-12-24 NOTE — Procedures (Signed)
Laceration Repair  Date/Time: 12/24/2022 7:06 PM  Performed by: Violeta Gelinas, MD Authorized by: Violeta Gelinas, MD   Consent:    Consent obtained:  Emergent situation Universal protocol:    Imaging studies available: yes     Site/side marked: yes     Immediately prior to procedure, a time out was called: yes     Patient identity confirmed:  Arm band Laceration details:    Location:  Face   Length (cm):  2   Depth (mm):  5 Pre-procedure details:    Preparation:  Patient was prepped and draped in usual sterile fashion and imaging obtained to evaluate for foreign bodies Exploration:    Hemostasis achieved with:  Direct pressure Treatment:    Area cleansed with:  Povidone-iodine   Irrigation solution:  Sterile saline Skin repair:    Repair method:  Sutures   Suture size:  4-0   Wound skin closure material used: vicryl. Repair type:    Repair type:  Simple Post-procedure details:    Dressing:  Open (no dressing)  Violeta Gelinas, MD, MPH, FACS Please use [email or web address] to contact on call provider

## 2022-12-24 NOTE — Anesthesia Preprocedure Evaluation (Signed)
Anesthesia Evaluation  Patient identified by MRN, date of birth, ID band Patient unresponsive    Reviewed: Allergy & Precautions, H&P , NPO status , Patient's Chart, lab work & pertinent test results  Airway Mallampati: Intubated  TM Distance: >3 FB     Dental no notable dental hx. (+) Teeth Intact, Dental Advisory Given   Pulmonary neg pulmonary ROS   Pulmonary exam normal breath sounds clear to auscultation       Cardiovascular negative cardio ROS  Rhythm:Regular Rate:Tachycardia     Neuro/Psych Developmental delay  negative psych ROS   GI/Hepatic negative GI ROS, Neg liver ROS,,,  Endo/Other  negative endocrine ROS    Renal/GU negative Renal ROS  negative genitourinary   Musculoskeletal   Abdominal   Peds  Hematology negative hematology ROS (+)   Anesthesia Other Findings   Reproductive/Obstetrics negative OB ROS                             Anesthesia Physical Anesthesia Plan  ASA: 2 and emergent  Anesthesia Plan: General   Post-op Pain Management:    Induction: Intravenous  PONV Risk Score and Plan: 2 and Treatment may vary due to age or medical condition  Airway Management Planned: Oral ETT  Additional Equipment: Arterial line  Intra-op Plan:   Post-operative Plan: Post-operative intubation/ventilation  Informed Consent: I have reviewed the patients History and Physical, chart, labs and discussed the procedure including the risks, benefits and alternatives for the proposed anesthesia with the patient or authorized representative who has indicated his/her understanding and acceptance.     Dental advisory given  Plan Discussed with: CRNA  Anesthesia Plan Comments:        Anesthesia Quick Evaluation
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
## 2022-12-24 NOTE — Transfer of Care (Signed)
Immediate Anesthesia Transfer of Care Note  Patient: Alexander Fritz  Procedure(s) Performed: HEMICRANIOTOMY FOR EVACUATION OF SUBDURAL HEMATOMA (Left: Head)  Patient Location: ICU  Anesthesia Type:General  Level of Consciousness: sedated and Patient remains intubated per anesthesia plan  Airway & Oxygen Therapy: Patient remains intubated per anesthesia plan and Patient placed on Ventilator (see vital sign flow sheet for setting)  Post-op Assessment: Report given to RN and Post -op Vital signs reviewed and stable  Post vital signs: Reviewed and stable  Last Vitals:  Vitals Value Taken Time  BP 107/90 12/24/22 2142  Temp 38 C 12/24/22 2151  Pulse 87 12/24/22 2151  Resp 19 12/24/22 2151  SpO2 100 % 12/24/22 2151  Vitals shown include unfiled device data.  Last Pain:  Vitals:   12/24/22 1737  TempSrc: Bladder         Complications: No notable events documented.

## 2022-12-24 NOTE — Progress Notes (Signed)
Patient intubated by ED MD without complications.  Bilateral breath sounds auscultated.  Positive color change noted.  Chest xray obtained and confirmed ETT placement.  No complications at this time.  Will continue to monitor.
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
## 2022-12-25 ENCOUNTER — Encounter (HOSPITAL_COMMUNITY): Payer: Self-pay | Admitting: Neurological Surgery

## 2022-12-25 ENCOUNTER — Inpatient Hospital Stay (HOSPITAL_COMMUNITY): Payer: 59

## 2022-12-25 LAB — PREPARE FRESH FROZEN PLASMA
Unit division: 0
Unit division: 0

## 2022-12-25 LAB — ABO/RH: ABO/RH(D): O POS

## 2022-12-25 LAB — POCT I-STAT 7, (LYTES, BLD GAS, ICA,H+H)
Acid-base deficit: 1 mmol/L (ref 0.0–2.0)
Acid-base deficit: 2 mmol/L (ref 0.0–2.0)
Bicarbonate: 23.7 mmol/L (ref 20.0–28.0)
Bicarbonate: 23.2 mmol/L (ref 20.0–28.0)
Calcium, Ion: 1.08 mmol/L — ABNORMAL LOW (ref 1.15–1.40)
Calcium, Ion: 1.06 mmol/L — ABNORMAL LOW (ref 1.15–1.40)
HCT: 34 % — ABNORMAL LOW (ref 39.0–52.0)
HCT: 26 % — ABNORMAL LOW (ref 39.0–52.0)
Hemoglobin: 11.6 g/dL — ABNORMAL LOW (ref 13.0–17.0)
Hemoglobin: 8.8 g/dL — ABNORMAL LOW (ref 13.0–17.0)
O2 Saturation: 100 %
O2 Saturation: 100 %
Patient temperature: 98.6
Potassium: 3.1 mmol/L — ABNORMAL LOW (ref 3.5–5.1)
Potassium: 3.3 mmol/L — ABNORMAL LOW (ref 3.5–5.1)
Sodium: 142 mmol/L (ref 135–145)
Sodium: 142 mmol/L (ref 135–145)
TCO2: 25 mmol/L (ref 22–32)
TCO2: 24 mmol/L (ref 22–32)
pCO2 arterial: 40.1 mmHg (ref 32–48)
pCO2 arterial: 39.2 mmHg (ref 32–48)
pH, Arterial: 7.379 (ref 7.35–7.45)
pH, Arterial: 7.381 (ref 7.35–7.45)
pO2, Arterial: 447 mmHg — ABNORMAL HIGH (ref 83–108)
pO2, Arterial: 546 mmHg — ABNORMAL HIGH (ref 83–108)

## 2022-12-25 LAB — CBC
HCT: 26.6 % — ABNORMAL LOW (ref 39.0–52.0)
Hemoglobin: 9.3 g/dL — ABNORMAL LOW (ref 13.0–17.0)
MCH: 30.6 pg (ref 26.0–34.0)
MCHC: 35 g/dL (ref 30.0–36.0)
MCV: 87.5 fL (ref 80.0–100.0)
Platelets: 105 10*3/uL — ABNORMAL LOW (ref 150–400)
RBC: 3.04 MIL/uL — ABNORMAL LOW (ref 4.22–5.81)
RDW: 13.2 % (ref 11.5–15.5)
WBC: 10.5 10*3/uL (ref 4.0–10.5)
nRBC: 0 % (ref 0.0–0.2)

## 2022-12-25 LAB — BASIC METABOLIC PANEL
Anion gap: 11 (ref 5–15)
BUN: 10 mg/dL (ref 6–20)
CO2: 19 mmol/L — ABNORMAL LOW (ref 22–32)
Calcium: 7.6 mg/dL — ABNORMAL LOW (ref 8.9–10.3)
Chloride: 108 mmol/L (ref 98–111)
Creatinine, Ser: 1.02 mg/dL (ref 0.61–1.24)
GFR, Estimated: >60 mL/min (ref >60)
Glucose, Bld: 151 mg/dL — ABNORMAL HIGH (ref 70–99)
Potassium: 3.8 mmol/L (ref 3.5–5.1)
Sodium: 138 mmol/L (ref 135–145)

## 2022-12-25 LAB — BPAM FFP
Blood Product Expiration Date: 202408122359
Blood Product Expiration Date: 202408122359
ISSUE DATE / TIME: 202408041856
ISSUE DATE / TIME: 202408041856
Unit Type and Rh: 6200
Unit Type and Rh: 6200

## 2022-12-25 LAB — GLUCOSE, CAPILLARY
Glucose-Capillary: 93 mg/dL (ref 70–99)
Glucose-Capillary: 102 mg/dL — ABNORMAL HIGH (ref 70–99)

## 2022-12-25 LAB — TRIGLYCERIDES: Triglycerides: 123 mg/dL (ref <150)

## 2022-12-25 LAB — BLOOD GAS, ARTERIAL
Acid-base deficit: 1.2 mmol/L (ref 0.0–2.0)
Bicarbonate: 23.6 mmol/L (ref 20.0–28.0)
O2 Saturation: 100 %
Patient temperature: 37.3
pCO2 arterial: 40 mmHg (ref 32–48)
pH, Arterial: 7.39 (ref 7.35–7.45)
pO2, Arterial: 170 mmHg — ABNORMAL HIGH (ref 83–108)

## 2022-12-25 LAB — MAGNESIUM: Magnesium: 1.5 mg/dL — ABNORMAL LOW (ref 1.7–2.4)

## 2022-12-25 LAB — PHOSPHORUS: Phosphorus: 2.2 mg/dL — ABNORMAL LOW (ref 2.5–4.6)

## 2022-12-25 MED ORDER — VALPROIC ACID 250 MG/5ML PO SOLN
500.0000 mg | Freq: Three times a day (TID) | ORAL | Status: DC
  Administered 2022-12-25 – 2023-02-14 (×155): 500 mg
  Filled 2022-12-25 (×156): qty 10

## 2022-12-25 MED ORDER — PROMETHAZINE HCL 25 MG PO TABS: 12.5000 mg | ORAL_TABLET | ORAL | Status: DC | PRN

## 2022-12-25 MED ORDER — OXYCODONE HCL 5 MG PO TABS
5.0000 mg | ORAL_TABLET | ORAL | Status: DC | PRN
  Filled 2022-12-25: qty 1

## 2022-12-25 MED ORDER — VITAL AF 1.2 CAL PO LIQD
1000.0000 mL | ORAL | Status: DC
  Filled 2022-12-25: qty 1000

## 2022-12-25 MED ORDER — METOCLOPRAMIDE HCL 5 MG/ML IJ SOLN: 5.0000 mg | Freq: Once | INTRAMUSCULAR | Status: DC

## 2022-12-25 MED ORDER — METOCLOPRAMIDE HCL 5 MG/ML IJ SOLN
INTRAMUSCULAR | Status: AC
  Filled 2022-12-25: qty 2

## 2022-12-25 MED ORDER — PIVOT 1.5 CAL PO LIQD
1000.0000 mL | ORAL | Status: DC
  Administered 2022-12-25 – 2022-12-27 (×3): 1000 mL
  Filled 2022-12-25: qty 1000

## 2022-12-25 MED ORDER — PIVOT 1.5 CAL PO LIQD
1000.0000 mL | ORAL | Status: DC
  Administered 2022-12-25: 1000 mL

## 2022-12-25 MED ORDER — RISPERIDONE 2 MG PO TABS
2.0000 mg | ORAL_TABLET | Freq: Every day | ORAL | Status: DC
  Administered 2022-12-25 – 2023-02-14 (×52): 2 mg
  Filled 2022-12-25 (×52): qty 1

## 2022-12-25 MED ORDER — HYDROXYZINE HCL 25 MG PO TABS
50.0000 mg | ORAL_TABLET | Freq: Three times a day (TID) | ORAL | Status: DC
  Administered 2022-12-25 – 2023-02-14 (×154): 50 mg
  Filled 2022-12-25 (×155): qty 2

## 2022-12-25 MED ORDER — PROSOURCE TF20 ENFIT COMPATIBL EN LIQD
60.0000 mL | Freq: Every day | ENTERAL | Status: DC
  Administered 2022-12-25: 60 mL
  Filled 2022-12-25: qty 60

## 2022-12-25 MED ORDER — SODIUM CHLORIDE 0.9% IV SOLUTION: Freq: Once | INTRAVENOUS | Status: AC

## 2022-12-25 MED ORDER — TRIHEXYPHENIDYL HCL 5 MG PO TABS
5.0000 mg | ORAL_TABLET | Freq: Two times a day (BID) | ORAL | Status: DC
  Administered 2022-12-25 – 2023-02-14 (×103): 5 mg
  Filled 2022-12-25 (×105): qty 1

## 2022-12-25 MED FILL — Thrombin For Soln 5000 Unit: CUTANEOUS | Qty: 5000 | Status: AC
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
## 2022-12-25 NOTE — Procedures (Signed)
Cortrak  Tube Type:  Cortrak - 43 inches Tube Location:  Left nare Initial Placement:  Postpyloric Secured by: Bridle Technique Used to Measure Tube Placement:  Marking at nare/corner of mouth Cortrak Secured At:  95 cm   Cortrak Tube Team Note:  Consult received to place a Cortrak feeding tube.   X-ray is required, abdominal x-ray has been ordered by the Cortrak team. Please confirm tube placement before using the Cortrak tube.   If the tube becomes dislodged please keep the tube and contact the Cortrak team at [email or web address] for replacement.  If after hours and replacement cannot be delayed, place a NG tube and confirm placement with an abdominal x-ray.    Sedonia Kitner MS, RD, LDN Please refer to AMION for RD and/or RD on-call/weekend/after hours pager   

## 2022-12-25 NOTE — Progress Notes (Signed)
EEG complete - results pending 
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
## 2022-12-25 NOTE — Progress Notes (Signed)
Patient transported to CT and back to 4N via vent without incident
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
## 2022-12-25 NOTE — Progress Notes (Signed)
Many leads displaced due to bandage on head due to surgery. P3/4 displaced 1cm down, c3/4 displaced 6cm down to hatband, f3/4 displaced 2cm forward, p7/8 displaced 2cm back
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
## 2022-12-26 ENCOUNTER — Inpatient Hospital Stay (HOSPITAL_COMMUNITY): Payer: 59

## 2022-12-26 DIAGNOSIS — G936 Cerebral edema: Secondary | ICD-10-CM | POA: Diagnosis not present

## 2022-12-26 LAB — CBC
HCT: 20.2 % — ABNORMAL LOW (ref 39.0–52.0)
Hemoglobin: 6.9 g/dL — CL (ref 13.0–17.0)
MCH: 29.4 pg (ref 26.0–34.0)
MCHC: 34.2 g/dL (ref 30.0–36.0)
MCV: 86 fL (ref 80.0–100.0)
Platelets: 78 10*3/uL — ABNORMAL LOW (ref 150–400)
RBC: 2.35 MIL/uL — ABNORMAL LOW (ref 4.22–5.81)
RDW: 13.2 % (ref 11.5–15.5)
WBC: 8.4 10*3/uL (ref 4.0–10.5)
nRBC: 0 % (ref 0.0–0.2)

## 2022-12-26 LAB — BASIC METABOLIC PANEL
Anion gap: 9 (ref 5–15)
BUN: 6 mg/dL (ref 6–20)
CO2: 24 mmol/L (ref 22–32)
Calcium: 7.6 mg/dL — ABNORMAL LOW (ref 8.9–10.3)
Chloride: 107 mmol/L (ref 98–111)
Creatinine, Ser: 0.82 mg/dL (ref 0.61–1.24)
GFR, Estimated: >60 mL/min (ref >60)
Glucose, Bld: 128 mg/dL — ABNORMAL HIGH (ref 70–99)
Potassium: 3.7 mmol/L (ref 3.5–5.1)
Sodium: 140 mmol/L (ref 135–145)

## 2022-12-26 LAB — GLUCOSE, CAPILLARY
Glucose-Capillary: 130 mg/dL — ABNORMAL HIGH (ref 70–99)
Glucose-Capillary: 130 mg/dL — ABNORMAL HIGH (ref 70–99)
Glucose-Capillary: 100 mg/dL — ABNORMAL HIGH (ref 70–99)
Glucose-Capillary: 107 mg/dL — ABNORMAL HIGH (ref 70–99)
Glucose-Capillary: 79 mg/dL (ref 70–99)
Glucose-Capillary: 115 mg/dL — ABNORMAL HIGH (ref 70–99)

## 2022-12-26 LAB — MAGNESIUM
Magnesium: 1.9 mg/dL (ref 1.7–2.4)
Magnesium: 2.1 mg/dL (ref 1.7–2.4)

## 2022-12-26 LAB — PREPARE RBC (CROSSMATCH)

## 2022-12-26 LAB — HEMOGLOBIN AND HEMATOCRIT, BLOOD
HCT: 23.6 % — ABNORMAL LOW (ref 39.0–52.0)
Hemoglobin: 8.2 g/dL — ABNORMAL LOW (ref 13.0–17.0)

## 2022-12-26 LAB — PHOSPHORUS
Phosphorus: 1.6 mg/dL — ABNORMAL LOW (ref 2.5–4.6)
Phosphorus: 2.4 mg/dL — ABNORMAL LOW (ref 2.5–4.6)

## 2022-12-26 MED ORDER — POTASSIUM PHOSPHATES 15 MMOLE/5ML IV SOLN
45.0000 mmol | Freq: Once | INTRAVENOUS | Status: AC
  Administered 2022-12-26: 45 mmol via INTRAVENOUS
  Filled 2022-12-26: qty 15

## 2022-12-26 MED ORDER — SODIUM CHLORIDE 0.9% IV SOLUTION: Freq: Once | INTRAVENOUS | Status: AC

## 2022-12-26 MED ORDER — OXYCODONE HCL 5 MG PO TABS
5.0000 mg | ORAL_TABLET | ORAL | Status: DC | PRN
  Administered 2022-12-26: 5 mg
  Administered 2022-12-27: 10 mg
  Administered 2022-12-27: 5 mg
  Administered 2022-12-28 – 2023-01-05 (×17): 10 mg
  Administered 2023-01-06 – 2023-01-07 (×4): 5 mg
  Administered 2023-01-07 (×3): 10 mg
  Administered 2023-01-08 (×3): 5 mg
  Administered 2023-01-09 – 2023-01-14 (×10): 10 mg
  Administered 2023-01-21 (×3): 5 mg
  Administered 2023-01-22 – 2023-01-23 (×3): 10 mg
  Administered 2023-01-24: 5 mg
  Administered 2023-01-24: 10 mg
  Administered 2023-01-25 – 2023-01-26 (×4): 5 mg
  Administered 2023-01-27 – 2023-02-05 (×16): 10 mg
  Administered 2023-02-06 (×2): 5 mg
  Administered 2023-02-06: 10 mg
  Filled 2022-12-26: qty 1
  Filled 2022-12-26 (×2): qty 2
  Filled 2022-12-26: qty 1
  Filled 2022-12-26: qty 2
  Filled 2022-12-26: qty 1
  Filled 2022-12-26 (×2): qty 2
  Filled 2022-12-26: qty 1
  Filled 2022-12-26 (×3): qty 2
  Filled 2022-12-26 (×4): qty 1
  Filled 2022-12-26 (×6): qty 2
  Filled 2022-12-26 (×2): qty 1
  Filled 2022-12-26 (×3): qty 2
  Filled 2022-12-26: qty 1
  Filled 2022-12-26 (×2): qty 2
  Filled 2022-12-26 (×2): qty 1
  Filled 2022-12-26 (×4): qty 2
  Filled 2022-12-26: qty 1
  Filled 2022-12-26: qty 2
  Filled 2022-12-26 (×2): qty 1
  Filled 2022-12-26 (×6): qty 2
  Filled 2022-12-26 (×2): qty 1
  Filled 2022-12-26 (×9): qty 2
  Filled 2022-12-26: qty 1
  Filled 2022-12-26 (×11): qty 2
  Filled 2022-12-26 (×2): qty 1
  Filled 2022-12-26: qty 2
  Filled 2022-12-26: qty 1
  Filled 2022-12-26: qty 2

## 2022-12-26 MED ORDER — METOPROLOL TARTRATE 25 MG PO TABS
25.0000 mg | ORAL_TABLET | Freq: Two times a day (BID) | ORAL | Status: DC
  Administered 2022-12-26 – 2022-12-31 (×12): 25 mg
  Filled 2022-12-26 (×13): qty 1

## 2022-12-26 NOTE — Progress Notes (Signed)
Pt transported to and from MRI on the ventilator without incident. 
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
## 2022-12-26 NOTE — Progress Notes (Signed)
   Providing Compassionate, Quality Care - Together  NEUROSURGERY PROGRESS NOTE   S: No issues overnight.   O: EXAM:  BP 108/64   Pulse 95   Temp (!) 100.9 F (38.3 C) (Bladder)   Resp (!) 22   Ht 5\' 9"  (1.753 m)   Wt 81.6 kg   SpO2 100%   BMI 26.57 kg/m   Intubated Eyes closed to pain Withdrawing bilateral upper extremities to pain Intermittently FC on LUE/LLE Dressing clean dry and intact, JP in place, blood-tinged CSF draining Pupils equally round reactive light, midline gaze   ASSESSMENT:  32 y.o. male with    Large left subdural hematoma, with midline shift TBI, with shear injury   PLAN: -DC JP -ok for DVT ppx tomorrow -mri pending for eval of TBI   Thank you for allowing me to participate in this patient's care.  Please do not hesitate to call with questions or concerns.   Monia Pouch, DO Neurosurgeon [facility] Cell: [phone]

## 2022-12-26 NOTE — Progress Notes (Signed)
Date and time results received: 12/26/22 0658  Test: Hemoglobin Critical Value: 6.9  Name of Provider Notified: Trauma MD Janee Morn  Orders Received? Or Actions Taken?: MD stated he will be rounding on patient shortly and will place orders.    Care ongoing.   Harriett Sine, RN

## 2022-12-26 NOTE — Progress Notes (Signed)
Hgb requiring RBC transfusion. Temp is 38.1. MD notified and ok to give RBC.  Legal Guardian [name]  [phone] called several time without respond. On call guardian [phone] called, stated they will call back after speaking with supervisor.  Per MD Neuro check q 1hr, pain q2 hr  will be changed in new orders

## 2022-12-26 NOTE — Plan of Care (Signed)
Pt is responding only to pain by slightly extending posture. Left extremities have some purposeful movements. No eye opening. Edema bilaterally (periorbital, all extremities). Pulses present and strong.  1 RBC was given today, Hgb increased. JP drain was removed by MD at the bedside. MRI done (see results).  Bed in low position, alarms are on, turns, mouth care, neuro checks q 2 hrs. Continue to monitor

## 2022-12-27 ENCOUNTER — Inpatient Hospital Stay (HOSPITAL_COMMUNITY): Payer: 59

## 2022-12-27 LAB — TYPE AND SCREEN
ABO/RH(D): O POS
Antibody Screen: NEGATIVE
Unit division: 0
Unit division: 0
Unit division: 0

## 2022-12-27 LAB — BPAM RBC
Blood Product Expiration Date: 202408282359
Blood Product Expiration Date: 202409022359
Blood Product Expiration Date: 202409062359
ISSUE DATE / TIME: 202408041744
ISSUE DATE / TIME: 202408041832
ISSUE DATE / TIME: 202408060946
Unit Type and Rh: 5100
Unit Type and Rh: 5100
Unit Type and Rh: 5100

## 2022-12-27 LAB — CBC
HCT: 22 % — ABNORMAL LOW (ref 39.0–52.0)
Hemoglobin: 7.7 g/dL — ABNORMAL LOW (ref 13.0–17.0)
MCH: 30.6 pg (ref 26.0–34.0)
MCHC: 35 g/dL (ref 30.0–36.0)
MCV: 87.3 fL (ref 80.0–100.0)
Platelets: 84 10*3/uL — ABNORMAL LOW (ref 150–400)
RBC: 2.52 MIL/uL — ABNORMAL LOW (ref 4.22–5.81)
RDW: 13.2 % (ref 11.5–15.5)
WBC: 9.4 10*3/uL (ref 4.0–10.5)
nRBC: 0 % (ref 0.0–0.2)

## 2022-12-27 LAB — PHOSPHORUS: Phosphorus: 1.3 mg/dL — ABNORMAL LOW (ref 2.5–4.6)

## 2022-12-27 LAB — GLUCOSE, CAPILLARY
Glucose-Capillary: 102 mg/dL — ABNORMAL HIGH (ref 70–99)
Glucose-Capillary: 103 mg/dL — ABNORMAL HIGH (ref 70–99)
Glucose-Capillary: 110 mg/dL — ABNORMAL HIGH (ref 70–99)
Glucose-Capillary: 108 mg/dL — ABNORMAL HIGH (ref 70–99)
Glucose-Capillary: 123 mg/dL — ABNORMAL HIGH (ref 70–99)
Glucose-Capillary: 140 mg/dL — ABNORMAL HIGH (ref 70–99)

## 2022-12-27 LAB — BASIC METABOLIC PANEL
Anion gap: 14 (ref 5–15)
BUN: 9 mg/dL (ref 6–20)
CO2: 23 mmol/L (ref 22–32)
Calcium: 8.2 mg/dL — ABNORMAL LOW (ref 8.9–10.3)
Chloride: 102 mmol/L (ref 98–111)
Creatinine, Ser: 0.69 mg/dL (ref 0.61–1.24)
GFR, Estimated: >60 mL/min (ref >60)
Glucose, Bld: 132 mg/dL — ABNORMAL HIGH (ref 70–99)
Potassium: 3.8 mmol/L (ref 3.5–5.1)
Sodium: 139 mmol/L (ref 135–145)

## 2022-12-27 LAB — MAGNESIUM: Magnesium: 2 mg/dL (ref 1.7–2.4)

## 2022-12-27 MED ORDER — ENOXAPARIN SODIUM 30 MG/0.3ML IJ SOSY
30.0000 mg | PREFILLED_SYRINGE | Freq: Two times a day (BID) | INTRAMUSCULAR | Status: DC
  Administered 2022-12-28 – 2023-02-14 (×97): 30 mg via SUBCUTANEOUS
  Filled 2022-12-27 (×98): qty 0.3

## 2022-12-27 MED ORDER — FENTANYL CITRATE PF 50 MCG/ML IJ SOSY
PREFILLED_SYRINGE | INTRAMUSCULAR | Status: AC
  Filled 2022-12-27: qty 1

## 2022-12-27 MED ORDER — ENOXAPARIN SODIUM 30 MG/0.3ML IJ SOSY: 30.0000 mg | PREFILLED_SYRINGE | Freq: Two times a day (BID) | INTRAMUSCULAR | Status: DC

## 2022-12-27 MED ORDER — FENTANYL CITRATE PF 50 MCG/ML IJ SOSY
25.0000 ug | PREFILLED_SYRINGE | INTRAMUSCULAR | Status: DC | PRN
  Administered 2022-12-27 – 2023-01-27 (×25): 50 ug via INTRAVENOUS
  Filled 2022-12-27 (×22): qty 1

## 2022-12-27 MED ORDER — POTASSIUM PHOSPHATES 15 MMOLE/5ML IV SOLN
45.0000 mmol | Freq: Once | INTRAVENOUS | Status: AC
  Administered 2022-12-27: 45 mmol via INTRAVENOUS
  Filled 2022-12-27: qty 15

## 2022-12-27 NOTE — Progress Notes (Signed)
    Providing Compassionate, Quality Care - Together   NEUROSURGERY PROGRESS NOTE     S: NAEs o/n.    O: EXAM:  BP 120/76   Pulse (!) 117   Temp 100.2 F (37.9 C)   Resp 19   Ht 5\' 9"  (1.753 m)   Wt 81.6 kg   SpO2 100%   BMI 26.57 kg/m     Intubated Eyes closed to pain Withdrawing all extremities to pain Dressing c/d/i PERRL   ASSESSMENT:  32 y.o.   -s/p hemicraniotomy for large L SDH, POD#3 -TBI with DAI on MRI   PLAN: -TBI therapies -Ok for DVT ppx today -Cont supportive care  -Call w/ questions/concerns.   Patrici Ranks, [facility]
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
## 2022-12-28 LAB — CBC
HCT: 21.6 % — ABNORMAL LOW (ref 39.0–52.0)
HCT: 23.6 % — ABNORMAL LOW (ref 39.0–52.0)
Hemoglobin: 7.3 g/dL — ABNORMAL LOW (ref 13.0–17.0)
Hemoglobin: 7.7 g/dL — ABNORMAL LOW (ref 13.0–17.0)
MCH: 30.4 pg (ref 26.0–34.0)
MCH: 30.2 pg (ref 26.0–34.0)
MCHC: 33.8 g/dL (ref 30.0–36.0)
MCHC: 32.6 g/dL (ref 30.0–36.0)
MCV: 90 fL (ref 80.0–100.0)
MCV: 92.5 fL (ref 80.0–100.0)
Platelets: 114 10*3/uL — ABNORMAL LOW (ref 150–400)
Platelets: 165 10*3/uL (ref 150–400)
RBC: 2.4 MIL/uL — ABNORMAL LOW (ref 4.22–5.81)
RBC: 2.55 MIL/uL — ABNORMAL LOW (ref 4.22–5.81)
RDW: 13.4 % (ref 11.5–15.5)
RDW: 13.6 % (ref 11.5–15.5)
WBC: 8.3 10*3/uL (ref 4.0–10.5)
WBC: 9.2 10*3/uL (ref 4.0–10.5)
nRBC: 0 % (ref 0.0–0.2)
nRBC: 0.5 % — ABNORMAL HIGH (ref 0.0–0.2)

## 2022-12-28 LAB — GLUCOSE, CAPILLARY
Glucose-Capillary: 122 mg/dL — ABNORMAL HIGH (ref 70–99)
Glucose-Capillary: 138 mg/dL — ABNORMAL HIGH (ref 70–99)
Glucose-Capillary: 132 mg/dL — ABNORMAL HIGH (ref 70–99)
Glucose-Capillary: 107 mg/dL — ABNORMAL HIGH (ref 70–99)
Glucose-Capillary: 95 mg/dL (ref 70–99)
Glucose-Capillary: 121 mg/dL — ABNORMAL HIGH (ref 70–99)

## 2022-12-28 LAB — BASIC METABOLIC PANEL
Anion gap: 8 (ref 5–15)
BUN: 13 mg/dL (ref 6–20)
CO2: 26 mmol/L (ref 22–32)
Calcium: 8.1 mg/dL — ABNORMAL LOW (ref 8.9–10.3)
Chloride: 104 mmol/L (ref 98–111)
Creatinine, Ser: 0.6 mg/dL — ABNORMAL LOW (ref 0.61–1.24)
GFR, Estimated: >60 mL/min (ref >60)
Glucose, Bld: 124 mg/dL — ABNORMAL HIGH (ref 70–99)
Potassium: 4.1 mmol/L (ref 3.5–5.1)
Sodium: 138 mmol/L (ref 135–145)

## 2022-12-28 LAB — PHOSPHORUS: Phosphorus: 2.9 mg/dL (ref 2.5–4.6)

## 2022-12-28 LAB — MAGNESIUM: Magnesium: 2.1 mg/dL (ref 1.7–2.4)

## 2022-12-28 MED ORDER — SODIUM CHLORIDE 0.9 % BOLUS PEDS: 1000.0000 mL | Freq: Once | INTRAVENOUS | Status: DC

## 2022-12-28 MED ORDER — PIVOT 1.5 CAL PO LIQD
1000.0000 mL | ORAL | Status: DC
  Administered 2022-12-29 – 2023-01-03 (×7): 1000 mL
  Filled 2022-12-28: qty 1000

## 2022-12-28 NOTE — Progress Notes (Signed)
   Providing Compassionate, Quality Care - Together  NEUROSURGERY PROGRESS NOTE   S: No issues overnight.   O: EXAM:  BP (!) 151/90   Pulse (!) 118   Temp 99.9 F (37.7 C) (Axillary)   Resp (!) 23   Ht 5\' 9"  (1.753 m)   Wt 81.6 kg   SpO2 100%   BMI 26.57 kg/m   Intubated Eyes closed PERRL Midline gaze Purposeful withdrawal left upper extremity, left lower extremity Extensor posturing right upper extremity Withdrawal to pain right lower extremity Dressing clean dry and intact  ASSESSMENT:  32 y.o. male with   Left acute subdural hematoma status post craniotomy for evacuation Moderate to severe traumatic brain injury  PLAN: -Okay for DVT prophylaxis -MRI of the brain reviewed, evidence of moderate to severe TBI.  There is minimal brainstem involvement but nonetheless there is some involvement.  There is still significant potential of neurologic improvement over the next 12 months and therefore I believe early trach and PEG would be beneficial.  Unsure if he has the capabilities to return to his neurologic baseline, does have significant autism, however there is decent probability of him regaining consciousness even with moderate to severe TBI. -Continue Keppra x 14 days   Thank you for allowing me to participate in this patient's care.  Please do not hesitate to call with questions or concerns.   Monia Pouch, DO Neurosurgeon [facility] Cell: [phone]

## 2022-12-28 NOTE — Progress Notes (Signed)
  Progress Note   Date: 12/28/2022  Patient Name: Alexander Fritz        MRN#: [id number]  Acute blood loss anemia
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
## 2022-12-28 NOTE — TOC Progression Note (Addendum)
Transition of Care [facility]) - Progression Note    Patient Details  Name: Alexander Fritz MRN: [id number] Date of Birth: 1991/04/10  Transition of Care [facility]) CM/SW Contact  Mearl Latin, LCSW Phone Number: 12/28/2022, 9:11 AM  Clinical Narrative:    TOC continuing to follow. CSW received call from Wanita Chamberlain [phone] with [facility] [place] (Guardian Alexander Fritz's team lead). He wanted to confirm that all decisions need to go through them and not to release info to any other family members that may contact the hospital. He or Dorathy Daft can be of assistance and their cell phones have after hours contact info as well if needed.      Barriers to Discharge: Continued Medical Work up  Expected Discharge Plan and Services In-house Referral: Clinical Social Work     Living arrangements for the past 2 months: Group Home                                       Social Determinants of Health (SDOH) Interventions    Readmission Risk Interventions     No data to display

## 2022-12-29 LAB — GLUCOSE, CAPILLARY
Glucose-Capillary: 131 mg/dL — ABNORMAL HIGH (ref 70–99)
Glucose-Capillary: 138 mg/dL — ABNORMAL HIGH (ref 70–99)
Glucose-Capillary: 109 mg/dL — ABNORMAL HIGH (ref 70–99)
Glucose-Capillary: 109 mg/dL — ABNORMAL HIGH (ref 70–99)
Glucose-Capillary: 104 mg/dL — ABNORMAL HIGH (ref 70–99)
Glucose-Capillary: 140 mg/dL — ABNORMAL HIGH (ref 70–99)

## 2022-12-29 LAB — CBC
HCT: 23.7 % — ABNORMAL LOW (ref 39.0–52.0)
Hemoglobin: 7.8 g/dL — ABNORMAL LOW (ref 13.0–17.0)
MCH: 30.2 pg (ref 26.0–34.0)
MCHC: 32.9 g/dL (ref 30.0–36.0)
MCV: 91.9 fL (ref 80.0–100.0)
Platelets: 190 10*3/uL (ref 150–400)
RBC: 2.58 MIL/uL — ABNORMAL LOW (ref 4.22–5.81)
RDW: 13.5 % (ref 11.5–15.5)
WBC: 9.5 10*3/uL (ref 4.0–10.5)
nRBC: 0.5 % — ABNORMAL HIGH (ref 0.0–0.2)

## 2022-12-29 LAB — BASIC METABOLIC PANEL
Anion gap: 14 (ref 5–15)
BUN: 18 mg/dL (ref 6–20)
CO2: 27 mmol/L (ref 22–32)
Calcium: 8.5 mg/dL — ABNORMAL LOW (ref 8.9–10.3)
Chloride: 99 mmol/L (ref 98–111)
Creatinine, Ser: 0.8 mg/dL (ref 0.61–1.24)
GFR, Estimated: >60 mL/min (ref >60)
Glucose, Bld: 144 mg/dL — ABNORMAL HIGH (ref 70–99)
Potassium: 4.2 mmol/L (ref 3.5–5.1)
Sodium: 140 mmol/L (ref 135–145)

## 2022-12-29 MED ORDER — IBUPROFEN 100 MG/5ML PO SUSP
600.0000 mg | Freq: Four times a day (QID) | ORAL | Status: DC | PRN
  Administered 2022-12-29 – 2023-01-10 (×17): 600 mg
  Filled 2022-12-29 (×20): qty 30

## 2022-12-29 NOTE — Progress Notes (Signed)
   Providing Compassionate, Quality Care - Together  NEUROSURGERY PROGRESS NOTE   S: No issues overnight.   O: EXAM:  BP (!) 115/59   Pulse (!) 110   Temp (!) 102.7 F (39.3 C) (Axillary)   Resp 19   Ht 5\' 9"  (1.753 m)   Wt 74.5 kg   SpO2 98%   BMI 24.25 kg/m   Intubated Eyes closed PERRL Midline gaze Purposeful withdrawal left upper extremity, left lower extremity Extensor posturing right upper extremity Withdrawal to pain right lower extremity Dressing clean dry and intact   ASSESSMENT:  32 y.o. 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  Please do not hesitate to call with questions or concerns.   Monia Pouch, DO Neurosurgeon [facility] Cell: [phone]

## 2022-12-30 LAB — BASIC METABOLIC PANEL
Anion gap: 13 (ref 5–15)
BUN: 20 mg/dL (ref 6–20)
CO2: 27 mmol/L (ref 22–32)
Calcium: 8.4 mg/dL — ABNORMAL LOW (ref 8.9–10.3)
Chloride: 98 mmol/L (ref 98–111)
Creatinine, Ser: 0.73 mg/dL (ref 0.61–1.24)
GFR, Estimated: >60 mL/min (ref >60)
Glucose, Bld: 133 mg/dL — ABNORMAL HIGH (ref 70–99)
Potassium: 4.4 mmol/L (ref 3.5–5.1)
Sodium: 138 mmol/L (ref 135–145)

## 2022-12-30 LAB — CBC
HCT: 26.6 % — ABNORMAL LOW (ref 39.0–52.0)
Hemoglobin: 8.4 g/dL — ABNORMAL LOW (ref 13.0–17.0)
MCH: 30 pg (ref 26.0–34.0)
MCHC: 31.6 g/dL (ref 30.0–36.0)
MCV: 95 fL (ref 80.0–100.0)
Platelets: 288 K/uL (ref 150–400)
RBC: 2.8 MIL/uL — ABNORMAL LOW (ref 4.22–5.81)
RDW: 13.6 % (ref 11.5–15.5)
WBC: 11.8 K/uL — ABNORMAL HIGH (ref 4.0–10.5)
nRBC: 0.7 % — ABNORMAL HIGH (ref 0.0–0.2)

## 2022-12-30 LAB — GLUCOSE, CAPILLARY
Glucose-Capillary: 117 mg/dL — ABNORMAL HIGH (ref 70–99)
Glucose-Capillary: 118 mg/dL — ABNORMAL HIGH (ref 70–99)
Glucose-Capillary: 143 mg/dL — ABNORMAL HIGH (ref 70–99)
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
## 2022-12-30 NOTE — Progress Notes (Signed)
   Providing Compassionate, Quality Care - Together  NEUROSURGERY PROGRESS NOTE   S: No issues overnight.   O: EXAM:  BP 129/67 (BP Location: Left Arm)   Pulse (!) 103   Temp (!) 100.6 F (38.1 C) (Oral)   Resp 17   Ht 5\' 9"  (1.753 m)   Wt 74.5 kg   SpO2 100%   BMI 24.25 kg/m   Intubated Eyes closed PERRL Midline gaze Purposeful withdrawal left upper extremity, left lower extremity-wiggles toes Extensor posturing right upper extremity Withdrawal to pain right lower extremity Dressing clean dry and intact   ASSESSMENT:  32 y.o. 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  Please do not hesitate to call with questions or concerns.   Monia Pouch, DO Neurosurgeon [facility] Cell: [phone]
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
## 2022-12-31 LAB — CBC
HCT: 24.1 % — ABNORMAL LOW (ref 39.0–52.0)
Hemoglobin: 7.8 g/dL — ABNORMAL LOW (ref 13.0–17.0)
MCH: 30.4 pg (ref 26.0–34.0)
MCHC: 32.4 g/dL (ref 30.0–36.0)
MCV: 93.8 fL (ref 80.0–100.0)
Platelets: 391 10*3/uL (ref 150–400)
RBC: 2.57 MIL/uL — ABNORMAL LOW (ref 4.22–5.81)
RDW: 13.5 % (ref 11.5–15.5)
WBC: 12.4 10*3/uL — ABNORMAL HIGH (ref 4.0–10.5)
nRBC: 0.4 % — ABNORMAL HIGH (ref 0.0–0.2)

## 2022-12-31 LAB — BASIC METABOLIC PANEL
Anion gap: 11 (ref 5–15)
BUN: 22 mg/dL — ABNORMAL HIGH (ref 6–20)
CO2: 29 mmol/L (ref 22–32)
Calcium: 8.4 mg/dL — ABNORMAL LOW (ref 8.9–10.3)
Chloride: 98 mmol/L (ref 98–111)
Creatinine, Ser: 0.62 mg/dL (ref 0.61–1.24)
GFR, Estimated: >60 mL/min (ref >60)
Glucose, Bld: 123 mg/dL — ABNORMAL HIGH (ref 70–99)
Potassium: 4 mmol/L (ref 3.5–5.1)
Sodium: 138 mmol/L (ref 135–145)

## 2022-12-31 LAB — GLUCOSE, CAPILLARY
Glucose-Capillary: 112 mg/dL — ABNORMAL HIGH (ref 70–99)
Glucose-Capillary: 119 mg/dL — ABNORMAL HIGH (ref 70–99)
Glucose-Capillary: 137 mg/dL — ABNORMAL HIGH (ref 70–99)
Glucose-Capillary: 139 mg/dL — ABNORMAL HIGH (ref 70–99)
Glucose-Capillary: 142 mg/dL — ABNORMAL HIGH (ref 70–99)
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
## 2022-12-31 NOTE — Progress Notes (Signed)
..  Trauma Event Note    Reason for Call :   Called by primary RN, advised phlebotomy unable to obtain morning labs. I was able to get labs from existing saline locked line. 5cc waste was obtained prior to blood collection.    Last imported Vital Signs BP 128/76 (BP Location: Left Arm)   Pulse (!) 106   Temp 98.3 F (36.8 C) (Oral)   Resp 15   Ht 5\' 9"  (1.753 m)   Wt 164 lb 3.9 oz (74.5 kg)   SpO2 100%   BMI 24.25 kg/m   Trending CBC Recent Labs    12/28/22 2148 12/29/22 0656 12/30/22 0456  WBC 9.2 9.5 11.8*  HGB 7.7* 7.8* 8.4*  HCT 23.6* 23.7* 26.6*  PLT 165 190 288    Trending Coag's No results for input(s): "APTT", "INR" in the last 72 hours.  Trending BMET Recent Labs    12/29/22 0656 12/30/22 0456  NA 140 138  K 4.2 4.4  CL 99 98  CO2 27 27  BUN 18 20  CREATININE 0.80 0.73  GLUCOSE 144* 133*      Nomie Buchberger Dee  Trauma Response RN  Please call TRN at [phone] for further assistance.

## 2023-01-01 ENCOUNTER — Inpatient Hospital Stay (HOSPITAL_COMMUNITY): Payer: 59

## 2023-01-01 LAB — GLUCOSE, CAPILLARY
Glucose-Capillary: 145 mg/dL — ABNORMAL HIGH (ref 70–99)
Glucose-Capillary: 140 mg/dL — ABNORMAL HIGH (ref 70–99)
Glucose-Capillary: 135 mg/dL — ABNORMAL HIGH (ref 70–99)
Glucose-Capillary: 113 mg/dL — ABNORMAL HIGH (ref 70–99)
Glucose-Capillary: 117 mg/dL — ABNORMAL HIGH (ref 70–99)
Glucose-Capillary: 123 mg/dL — ABNORMAL HIGH (ref 70–99)
Glucose-Capillary: 122 mg/dL — ABNORMAL HIGH (ref 70–99)
Glucose-Capillary: 108 mg/dL — ABNORMAL HIGH (ref 70–99)
Glucose-Capillary: 118 mg/dL — ABNORMAL HIGH (ref 70–99)

## 2023-01-01 MED ORDER — METOPROLOL TARTRATE 50 MG PO TABS
50.0000 mg | ORAL_TABLET | Freq: Two times a day (BID) | ORAL | Status: DC
  Administered 2023-01-01 – 2023-01-02 (×3): 50 mg
  Filled 2023-01-01 (×3): qty 1

## 2023-01-01 MED ORDER — SODIUM CHLORIDE 0.9 % IV SOLN
2.0000 g | Freq: Three times a day (TID) | INTRAVENOUS | Status: DC
  Administered 2023-01-01 – 2023-01-03 (×7): 2 g via INTRAVENOUS
  Filled 2023-01-01 (×7): qty 12.5

## 2023-01-01 NOTE — Progress Notes (Incomplete)
Case discussed with patient's

## 2023-01-01 NOTE — Progress Notes (Signed)
Orthopedic Tech Progress Note Patient Details:  Alexander Fritz 1990-09-14 [id number]  Ortho Devices Type of Ortho Device: CAM walker Ortho Device/Splint Location: RLE Ortho Device/Splint Interventions: Ordered, Application, Adjustment   Post Interventions Patient Tolerated: Well Instructions Provided: Care of device  [name] 01/01/2023, 4:26 PM
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
## 2023-01-01 NOTE — Progress Notes (Signed)
RT changed and replaced ETT holder, HME, ballard and circuit tubing to ventilator. Pt tolerated well.

## 2023-01-01 NOTE — Progress Notes (Signed)
Patient ID: Alexander Fritz, male   DOB: [birth year], 32 y.o.   MRN: [id number]  Reviewed new right ankle x-rays that show a ND lateral mal fx. This is a stable fx and may be WBAT in CAM boot.    Freeman Caldron, PA-C Orthopedic Surgery [phone]
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
## 2023-01-02 LAB — GLUCOSE, CAPILLARY
Glucose-Capillary: 100 mg/dL — ABNORMAL HIGH (ref 70–99)
Glucose-Capillary: 108 mg/dL — ABNORMAL HIGH (ref 70–99)
Glucose-Capillary: 116 mg/dL — ABNORMAL HIGH (ref 70–99)
Glucose-Capillary: 119 mg/dL — ABNORMAL HIGH (ref 70–99)
Glucose-Capillary: 126 mg/dL — ABNORMAL HIGH (ref 70–99)
Glucose-Capillary: 141 mg/dL — ABNORMAL HIGH (ref 70–99)

## 2023-01-02 LAB — CULTURE, RESPIRATORY W GRAM STAIN: Culture: NORMAL

## 2023-01-02 MED ORDER — METOPROLOL TARTRATE 50 MG PO TABS
75.0000 mg | ORAL_TABLET | Freq: Two times a day (BID) | ORAL | Status: DC
  Administered 2023-01-02 – 2023-01-05 (×7): 75 mg
  Filled 2023-01-02 (×7): qty 1
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
## 2023-01-03 LAB — GLUCOSE, CAPILLARY
Glucose-Capillary: 101 mg/dL — ABNORMAL HIGH (ref 70–99)
Glucose-Capillary: 126 mg/dL — ABNORMAL HIGH (ref 70–99)
Glucose-Capillary: 100 mg/dL — ABNORMAL HIGH (ref 70–99)
Glucose-Capillary: 124 mg/dL — ABNORMAL HIGH (ref 70–99)
Glucose-Capillary: 130 mg/dL — ABNORMAL HIGH (ref 70–99)
Glucose-Capillary: 114 mg/dL — ABNORMAL HIGH (ref 70–99)

## 2023-01-03 MED ORDER — FENTANYL 2500MCG IN NS 250ML (10MCG/ML) PREMIX INFUSION
0.0000 ug/h | INTRAVENOUS | Status: DC
  Administered 2023-01-03 – 2023-01-04 (×2): 25 ug/h via INTRAVENOUS
  Administered 2023-01-05: 50 ug/h via INTRAVENOUS
  Filled 2023-01-03 (×3): qty 250

## 2023-01-03 MED ORDER — DEXMEDETOMIDINE HCL IN NACL 400 MCG/100ML IV SOLN
0.0000 ug/kg/h | INTRAVENOUS | Status: DC
  Administered 2023-01-03 – 2023-01-04 (×2): 0.4 ug/kg/h via INTRAVENOUS
  Administered 2023-01-05 (×2): 0.3 ug/kg/h via INTRAVENOUS
  Administered 2023-01-05: 1 ug/kg/h via INTRAVENOUS
  Administered 2023-01-06: 0.4 ug/kg/h via INTRAVENOUS
  Administered 2023-01-07: 0.3 ug/kg/h via INTRAVENOUS
  Administered 2023-01-07 – 2023-01-09 (×3): 0.5 ug/kg/h via INTRAVENOUS
  Filled 2023-01-03 (×8): qty 100

## 2023-01-03 MED ORDER — METOPROLOL TARTRATE 5 MG/5ML IV SOLN: 5.0000 mg | Freq: Four times a day (QID) | INTRAVENOUS | Status: DC | PRN

## 2023-01-03 MED ORDER — PIVOT 1.5 CAL PO LIQD
1000.0000 mL | ORAL | Status: DC
  Administered 2023-01-03 – 2023-02-14 (×57): 1000 mL
  Filled 2023-01-03 (×12): qty 1000

## 2023-01-03 MED ORDER — DEXMEDETOMIDINE HCL IN NACL 400 MCG/100ML IV SOLN
INTRAVENOUS | Status: AC
  Filled 2023-01-03: qty 100

## 2023-01-03 MED ORDER — BANATROL TF EN LIQD
60.0000 mL | Freq: Two times a day (BID) | ENTERAL | Status: DC
  Administered 2023-01-03: 60 mL
  Filled 2023-01-03: qty 60

## 2023-01-03 MED ORDER — SODIUM CHLORIDE 0.9 % IV BOLUS
1000.0000 mL | Freq: Once | INTRAVENOUS | Status: AC
  Administered 2023-01-03: 1000 mL via INTRAVENOUS
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
## 2023-01-03 NOTE — TOC Progression Note (Signed)
Transition of Care [facility]) - Progression Note    Patient Details  Name: Sai Licursi MRN: [id number] Date of Birth: [birth year]  [facility]) CM/SW Contact  Glennon Mac, RN Phone Number: 01/03/2023, 2:05 PM  Clinical Narrative:    Patient currently remains intubated; unable to extubate until neuro status improves.  He remains NPO with Cortrak in place.  MD has been in discussions with patient's guardian regarding consent for upcoming trach/PEG and receipt of guardianship paperwork.   Guardianship paperwork has not been received, though patient's guardian states she has faxed it.  Spoke with [name], patient's guardian: She states she will fax the paperwork to [facility] ICU fax machine this afternoon at [phone]. Marybelle Killings to be marked attention Dr. Bedelia Person.  Please place guardianship paperwork in patient's chart when received.     Barriers to Discharge: Continued Medical Work up  Expected Discharge Plan and Services In-house Referral: Clinical Social Work     Living arrangements for the past 2 months: Group Home                                       Social Determinants of Health (SDOH) Interventions    Readmission Risk Interventions     No data to display         Quintella Baton, RN, BSN  Trauma/Neuro ICU Case Manager [phone]
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
## 2023-01-03 NOTE — Progress Notes (Signed)
Beginning of shift RR 38-45 while weaning with HR 120s. PRN fentanyl given and flipped to full support; remains dyssynchronous. Obtained order from MD [facility] for continuous fentanyl gtt. Vent settings discussed with MD/RT throughout day. Dyssynchrony decreased though continues despite boluses and increase in gtt rate, pt without secretions, HME clear, etc; patient now retaining urine, HR elevated 120s-130s. Order obtained from MD [facility] for precedex gtt and foley insertion; HR goal <130. Hold on retention meds as flomax unable to be given through cortrak.

## 2023-01-04 LAB — GLUCOSE, CAPILLARY
Glucose-Capillary: 103 mg/dL — ABNORMAL HIGH (ref 70–99)
Glucose-Capillary: 107 mg/dL — ABNORMAL HIGH (ref 70–99)
Glucose-Capillary: 111 mg/dL — ABNORMAL HIGH (ref 70–99)
Glucose-Capillary: 123 mg/dL — ABNORMAL HIGH (ref 70–99)
Glucose-Capillary: 124 mg/dL — ABNORMAL HIGH (ref 70–99)
Glucose-Capillary: 124 mg/dL — ABNORMAL HIGH (ref 70–99)
Glucose-Capillary: 131 mg/dL — ABNORMAL HIGH (ref 70–99)

## 2023-01-04 MED ORDER — DOCUSATE SODIUM 50 MG/5ML PO LIQD
100.0000 mg | Freq: Every day | ORAL | Status: DC
  Administered 2023-01-04 – 2023-02-08 (×24): 100 mg
  Filled 2023-01-04 (×31): qty 10
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
## 2023-01-04 NOTE — Plan of Care (Signed)
  Problem: Education: Goal: Knowledge of the prescribed therapeutic regimen will improve Outcome: Not Progressing   Problem: Clinical Measurements: Goal: Usual level of consciousness will be regained or maintained. Outcome: Not Progressing Goal: Neurologic status will improve Outcome: Not Progressing   Problem: Activity: Goal: Risk for activity intolerance will decrease Outcome: Not Progressing   Problem: Nutrition: Goal: Adequate nutrition will be maintained Outcome: Progressing

## 2023-01-05 ENCOUNTER — Inpatient Hospital Stay (HOSPITAL_COMMUNITY): Payer: 59

## 2023-01-05 LAB — GLUCOSE, CAPILLARY
Glucose-Capillary: 103 mg/dL — ABNORMAL HIGH (ref 70–99)
Glucose-Capillary: 108 mg/dL — ABNORMAL HIGH (ref 70–99)
Glucose-Capillary: 113 mg/dL — ABNORMAL HIGH (ref 70–99)
Glucose-Capillary: 114 mg/dL — ABNORMAL HIGH (ref 70–99)
Glucose-Capillary: 124 mg/dL — ABNORMAL HIGH (ref 70–99)
Glucose-Capillary: 134 mg/dL — ABNORMAL HIGH (ref 70–99)

## 2023-01-05 LAB — CBC
HCT: 24.2 % — ABNORMAL LOW (ref 39.0–52.0)
Hemoglobin: 7.7 g/dL — ABNORMAL LOW (ref 13.0–17.0)
MCH: 30.3 pg (ref 26.0–34.0)
MCHC: 31.8 g/dL (ref 30.0–36.0)
MCV: 95.3 fL (ref 80.0–100.0)
Platelets: 763 10*3/uL — ABNORMAL HIGH (ref 150–400)
RBC: 2.54 MIL/uL — ABNORMAL LOW (ref 4.22–5.81)
RDW: 13.2 % (ref 11.5–15.5)
WBC: 13 10*3/uL — ABNORMAL HIGH (ref 4.0–10.5)
nRBC: 0 % (ref 0.0–0.2)

## 2023-01-05 LAB — BASIC METABOLIC PANEL
Anion gap: 11 (ref 5–15)
BUN: 29 mg/dL — ABNORMAL HIGH (ref 6–20)
CO2: 28 mmol/L (ref 22–32)
Calcium: 8.7 mg/dL — ABNORMAL LOW (ref 8.9–10.3)
Chloride: 103 mmol/L (ref 98–111)
Creatinine, Ser: 0.57 mg/dL — ABNORMAL LOW (ref 0.61–1.24)
GFR, Estimated: >60 mL/min (ref >60)
Glucose, Bld: 111 mg/dL — ABNORMAL HIGH (ref 70–99)
Potassium: 4.1 mmol/L (ref 3.5–5.1)
Sodium: 142 mmol/L (ref 135–145)

## 2023-01-05 MED ORDER — LIDOCAINE HCL (PF) 1 % IJ SOLN
INTRAMUSCULAR | Status: AC
  Filled 2023-01-05: qty 30

## 2023-01-05 MED ORDER — ROCURONIUM BROMIDE 10 MG/ML (PF) SYRINGE
100.0000 mg | PREFILLED_SYRINGE | Freq: Once | INTRAVENOUS | Status: AC
  Administered 2023-01-05: 100 mg via INTRAVENOUS

## 2023-01-05 MED ORDER — ROCURONIUM BROMIDE 10 MG/ML (PF) SYRINGE: 100.0000 mg | PREFILLED_SYRINGE | Freq: Once | INTRAVENOUS | Status: AC

## 2023-01-05 MED ORDER — MIDAZOLAM HCL (PF) 10 MG/2ML IJ SOLN
INTRAMUSCULAR | Status: AC
  Filled 2023-01-05: qty 2

## 2023-01-05 MED ORDER — ROCURONIUM BROMIDE 10 MG/ML (PF) SYRINGE
PREFILLED_SYRINGE | INTRAVENOUS | Status: AC
  Administered 2023-01-05: 100 mg
  Filled 2023-01-05: qty 10

## 2023-01-05 MED ORDER — MIDAZOLAM HCL 2 MG/2ML IJ SOLN
2.0000 mg | Freq: Once | INTRAMUSCULAR | Status: AC
  Administered 2023-01-05: 2 mg via INTRAVENOUS

## 2023-01-05 MED ORDER — MIDAZOLAM HCL 2 MG/2ML IJ SOLN
INTRAMUSCULAR | Status: AC
  Administered 2023-01-05: 2 mg via INTRAVENOUS
  Filled 2023-01-05: qty 10

## 2023-01-05 MED ORDER — MIDAZOLAM HCL 2 MG/2ML IJ SOLN: 2.0000 mg | Freq: Once | INTRAMUSCULAR | Status: AC

## 2023-01-05 MED ORDER — LACTATED RINGERS IV BOLUS
500.0000 mL | Freq: Once | INTRAVENOUS | Status: AC
  Administered 2023-01-05: 500 mL via INTRAVENOUS

## 2023-01-05 MED ORDER — ROCURONIUM BROMIDE 10 MG/ML (PF) SYRINGE
PREFILLED_SYRINGE | INTRAVENOUS | Status: AC
  Administered 2023-01-05: 100 mg via INTRAVENOUS
  Filled 2023-01-05: qty 10

## 2023-01-05 NOTE — Progress Notes (Signed)
Patient experiencing hypotension after 2200 meds. Precedex titrated down and Alycia Rossetti, [employer] notified. SBP continued to drop after sedation titrations and order was given for 500 cc LR bolus.

## 2023-01-05 NOTE — Procedures (Signed)
Operative Note  Date: 01/05/2023  Procedure: percutaneous tracheostomy without bronchoscopic assistance, esophagogastroduodenoscopy (EGD) and percutaneous endoscopic gastrostomy (PEG) tube placement  Pre-op diagnosis: TBI Post-op diagnosis: same  Indication and clinical history: The patient is a 32 y.o. year old male with TBI  Surgeon: Diamantina Monks, MD Assistant: Emmaline Kluver, PA  Findings:  Specimen: none EBL: <5cc  Drains/Implants: #6 Shiley cuffed  tracheostomy tube, PEG tube, 3 cm at the skin   Disposition: ICU  Description of procedure: The patient was positioned supine with a shoulder roll. Time-out was performed verifying correct patient, procedure, signature of informed consent, and pre-operative antibiotics as indicated. Anesthetic induction was uneventful and the patient was confirmed to be on 100% FiO2. The neck was prepped and draped in the usual sterile fashion. Palpation of neck anatomy was performed. A longitudinal incision was made in the neck and deepened down to the trachea.   The pilot balloon was deflated and the endotracheal tube slowly retracted proximally until the tip of the endotracheal tube was palpated just below the cricoid cartilage. An introducer needle was inserted between the second and third tracheal rings. A guidewire was passed through the introducer needle and the needle removed. Serial dilation was performed and a #6   cuffed Shiley and stylet were inserted over the guidewire and the guidewire and stylet removed. The inner cannula was inserted, the pilot balloon on the tracheostomy inflated, and the ventilator tubing disconnected from the endotracheal tube and connected to the tracheostomy. Chest rise, appropriate end-tidal CO2, and return tidal volumes were confirmed. The tracheostomy tube was sutured in four quadrants and a tracheostomy tie applied to the neck. The endotracheal tube was removed. The patient tolerated the procedure well. There were no  complications. Post-procedure chest x-ray was ordered to confirm tube position and the absence of a pneumothorax.  A bite block was placed into the oropharynx. The endoscope was inserted into the oropharynx and advanced down the esophagus into the stomach and into the duodenum. The visualized esophagus and duodenum were unremarkable. The endoscope was retracted back into the stomach and the stomach was insufflated. The stomach was inspected and was also normal. Transillumination was performed. The light was visible on the external skin and dimpling of the stomach was noted endoscopically with manual pressure. The abdomen was prepped and draped in the usual sterile fashion. Transillumination and dimpling were repeated and local anesthetic was infiltrated to make a skin wheal at the site of transillumination. The needle was inserted perpendicularly to the skin and the tip of the needle was visualized endoscopically. As the needle was retracted, the tract was also anesthetized. A skin nick was made at the site of the wheal and an introducer needle and sheath were inserted. The needle was removed and guidewire inserted. The guidewire was grasped by an endoscopic snare and the snare, guidewire, and endoscope retracted out of the oropharynx. The PEG tube was secured to the guidewire and retracted through the mouth and esophagus into the stomach. The PEG tube was secured with a bolster and was visualized endoscopically to spin freely circumferentially and also be without gaps between the internal bumper and the stomach wall. There was no evidence of bleeding. The PEG bolster was secured at 3cm at the skin and there were no gaps between the bolster and the abdominal wall. The stomach was desufflated endoscopically and the endoscope removed. The bite block was also removed. The patient tolerated the procedure well and there were no complications.   The patient  may have water and medications administered via the PEG tube  beginning immediately and tube feeds may be initiated four hours post-procedure.    Diamantina Monks, MD General and Trauma Surgery [facility]

## 2023-01-05 NOTE — TOC Progression Note (Signed)
Transition of Care [facility]) - Progression Note    Patient Details  Name: Alexander Fritz MRN: [id number] Date of Birth: Jan 12, 1991  Transition of Care [facility]) CM/SW Contact  Glennon Mac, RN Phone Number: 01/05/2023, 5:34 PM  Clinical Narrative:    Letter prepared for Jeanice Lim at [facility], where patient was residing prior to admission.  Patient will need a higher level of care at discharge, and [name] needed documentation that patient would not return to group home.  Nehemiah Settle, RN to facilitate getting letter to [name].      Barriers to Discharge: Continued Medical Work up  Expected Discharge Plan and Services In-house Referral: Clinical Social Work     Living arrangements for the past 2 months: Group Home                                       Social Determinants of Health (SDOH) Interventions    Readmission Risk Interventions     No data to display         Quintella Baton, RN, BSN  Trauma/Neuro ICU Case Manager [phone]
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
## 2023-01-05 NOTE — Procedures (Signed)
Bedside Tracheostomy Insertion Procedure Note   Patient Details:   Name: Alexander Fritz DOB: 26-May-1990 MRN: [id number]  Procedure: Tracheostomy  Pre Procedure Assessment: ET Tube Size:8.0 ET Tube secured at lip (cm):24 Bite block in place: Yes Breath Sounds: Diminished  Post Procedure Assessment: BP (!) 152/91   Pulse (!) 114   Temp 98.9 F (37.2 C) (Axillary)   Resp 20   Ht 5\' 9"  (1.753 m)   Wt 67.5 kg   SpO2 100%   BMI 21.98 kg/m  O2 sats: stable throughout Complications: No apparent complications Patient did tolerate procedure well Tracheostomy Brand:Shiley Tracheostomy Style:Cuffed Tracheostomy Size:  6.0 Tracheostomy Secured WUJ:[id number] Tracheostomy Placement Confirmation:Chest X ray ordered for placement    Leafy Ro 01/05/2023, 9:32 AM

## 2023-01-06 ENCOUNTER — Inpatient Hospital Stay (HOSPITAL_COMMUNITY): Payer: 59

## 2023-01-06 LAB — GLUCOSE, CAPILLARY
Glucose-Capillary: 102 mg/dL — ABNORMAL HIGH (ref 70–99)
Glucose-Capillary: 122 mg/dL — ABNORMAL HIGH (ref 70–99)
Glucose-Capillary: 125 mg/dL — ABNORMAL HIGH (ref 70–99)
Glucose-Capillary: 126 mg/dL — ABNORMAL HIGH (ref 70–99)
Glucose-Capillary: 129 mg/dL — ABNORMAL HIGH (ref 70–99)
Glucose-Capillary: 134 mg/dL — ABNORMAL HIGH (ref 70–99)

## 2023-01-06 MED ORDER — LEVETIRACETAM 100 MG/ML PO SOLN
500.0000 mg | Freq: Two times a day (BID) | ORAL | Status: AC
  Administered 2023-01-06 – 2023-01-07 (×2): 500 mg
  Filled 2023-01-06 (×2): qty 5

## 2023-01-06 MED ORDER — CHLORHEXIDINE GLUCONATE CLOTH 2 % EX PADS
6.0000 | MEDICATED_PAD | Freq: Every day | CUTANEOUS | Status: DC
  Administered 2023-01-06 – 2023-01-07 (×2): 6 via TOPICAL

## 2023-01-06 NOTE — Progress Notes (Signed)
Upon arrival to unit at 1900, patient was tachycardic in 130-140s, hypoxic with oxygen saturation in mid 80s and RAS score of ~2. Tracheal suction x2 performed by myself with minimal output or improvement, inner canula was also changed at this time. Called RT for assistance and patient was placed back on full support, precedex was titrated, and PRN pain medication was given. Patient is now resting comfortably at the time of this note in NSR and oxygen at 100%. TRN notified of interventions and in agreement with plan of care.
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
## 2023-01-06 NOTE — Plan of Care (Signed)
  Problem: Skin Integrity: Goal: Demonstration of wound healing without infection will improve Outcome: Progressing   Problem: Clinical Measurements: Goal: Ability to maintain clinical measurements within normal limits will improve Outcome: Progressing Goal: Will remain free from infection Outcome: Progressing Goal: Diagnostic test results will improve Outcome: Progressing Goal: Respiratory complications will improve Outcome: Progressing Goal: Cardiovascular complication will be avoided Outcome: Progressing   Problem: Activity: Goal: Risk for activity intolerance will decrease Outcome: Progressing   Problem: Nutrition: Goal: Adequate nutrition will be maintained Outcome: Progressing   Problem: Elimination: Goal: Will not experience complications related to bowel motility Outcome: Progressing Goal: Will not experience complications related to urinary retention Outcome: Progressing   Problem: Pain Managment: Goal: General experience of comfort will improve Outcome: Progressing   Problem: Safety: Goal: Ability to remain free from injury will improve Outcome: Progressing   Problem: Education: Goal: Knowledge of the prescribed therapeutic regimen will improve Outcome: Not Progressing   Problem: Clinical Measurements: Goal: Usual level of consciousness will be regained or maintained. Outcome: Not Progressing Goal: Neurologic status will improve Outcome: Not Progressing   Problem: Skin Integrity: Goal: Risk for impaired skin integrity will decrease Outcome: Not Progressing

## 2023-01-07 LAB — HEPATIC FUNCTION PANEL
ALT: 70 U/L — ABNORMAL HIGH (ref 0–44)
AST: 53 U/L — ABNORMAL HIGH (ref 15–41)
Albumin: 2.2 g/dL — ABNORMAL LOW (ref 3.5–5.0)
Alkaline Phosphatase: 229 U/L — ABNORMAL HIGH (ref 38–126)
Bilirubin, Direct: 0.2 mg/dL (ref 0.0–0.2)
Indirect Bilirubin: 0.5 mg/dL (ref 0.3–0.9)
Total Bilirubin: 0.7 mg/dL (ref 0.3–1.2)
Total Protein: 6.6 g/dL (ref 6.5–8.1)

## 2023-01-07 LAB — GLUCOSE, CAPILLARY
Glucose-Capillary: 107 mg/dL — ABNORMAL HIGH (ref 70–99)
Glucose-Capillary: 111 mg/dL — ABNORMAL HIGH (ref 70–99)
Glucose-Capillary: 114 mg/dL — ABNORMAL HIGH (ref 70–99)
Glucose-Capillary: 124 mg/dL — ABNORMAL HIGH (ref 70–99)
Glucose-Capillary: 127 mg/dL — ABNORMAL HIGH (ref 70–99)
Glucose-Capillary: 128 mg/dL — ABNORMAL HIGH (ref 70–99)

## 2023-01-07 MED ORDER — BETHANECHOL CHLORIDE 10 MG PO TABS
5.0000 mg | ORAL_TABLET | Freq: Three times a day (TID) | ORAL | Status: DC
  Administered 2023-01-07 – 2023-01-11 (×13): 5 mg
  Filled 2023-01-07 (×13): qty 1

## 2023-01-07 MED ORDER — BETHANECHOL NICU ORAL SYRINGE 1 MG/ML: 0.2000 mg/kg | Freq: Four times a day (QID) | ORAL | Status: DC
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
## 2023-01-07 NOTE — Plan of Care (Signed)
  Problem: Clinical Measurements: Goal: Ability to maintain clinical measurements within normal limits will improve Outcome: Progressing Goal: Will remain free from infection Outcome: Progressing Goal: Diagnostic test results will improve Outcome: Progressing Goal: Respiratory complications will improve Outcome: Progressing Goal: Cardiovascular complication will be avoided Outcome: Progressing   Problem: Activity: Goal: Risk for activity intolerance will decrease Outcome: Progressing   Problem: Nutrition: Goal: Adequate nutrition will be maintained Outcome: Progressing   Problem: Elimination: Goal: Will not experience complications related to bowel motility Outcome: Progressing Goal: Will not experience complications related to urinary retention Outcome: Progressing   Problem: Pain Managment: Goal: General experience of comfort will improve Outcome: Progressing   Problem: Safety: Goal: Ability to remain free from injury will improve Outcome: Progressing   Problem: Skin Integrity: Goal: Risk for impaired skin integrity will decrease Outcome: Progressing   Problem: Education: Goal: Knowledge of the prescribed therapeutic regimen will improve Outcome: Not Progressing   Problem: Clinical Measurements: Goal: Usual level of consciousness will be regained or maintained. Outcome: Not Progressing Goal: Neurologic status will improve Outcome: Not Progressing   Problem: Skin Integrity: Goal: Demonstration of wound healing without infection will improve Outcome: Not Progressing

## 2023-01-08 ENCOUNTER — Inpatient Hospital Stay (HOSPITAL_COMMUNITY): Payer: 59

## 2023-01-08 LAB — COMPREHENSIVE METABOLIC PANEL
ALT: 58 U/L — ABNORMAL HIGH (ref 0–44)
AST: 40 U/L (ref 15–41)
Albumin: 2.3 g/dL — ABNORMAL LOW (ref 3.5–5.0)
Alkaline Phosphatase: 255 U/L — ABNORMAL HIGH (ref 38–126)
Anion gap: 12 (ref 5–15)
BUN: 29 mg/dL — ABNORMAL HIGH (ref 6–20)
CO2: 25 mmol/L (ref 22–32)
Calcium: 8.4 mg/dL — ABNORMAL LOW (ref 8.9–10.3)
Chloride: 103 mmol/L (ref 98–111)
Creatinine, Ser: 0.72 mg/dL (ref 0.61–1.24)
GFR, Estimated: >60 mL/min (ref >60)
Glucose, Bld: 107 mg/dL — ABNORMAL HIGH (ref 70–99)
Potassium: 4.2 mmol/L (ref 3.5–5.1)
Sodium: 140 mmol/L (ref 135–145)
Total Bilirubin: 0.2 mg/dL — ABNORMAL LOW (ref 0.3–1.2)
Total Protein: 6.7 g/dL (ref 6.5–8.1)

## 2023-01-08 LAB — GLUCOSE, CAPILLARY
Glucose-Capillary: 104 mg/dL — ABNORMAL HIGH (ref 70–99)
Glucose-Capillary: 108 mg/dL — ABNORMAL HIGH (ref 70–99)
Glucose-Capillary: 109 mg/dL — ABNORMAL HIGH (ref 70–99)
Glucose-Capillary: 123 mg/dL — ABNORMAL HIGH (ref 70–99)
Glucose-Capillary: 85 mg/dL (ref 70–99)
Glucose-Capillary: 99 mg/dL (ref 70–99)

## 2023-01-08 LAB — VALPROIC ACID LEVEL: Valproic Acid Lvl: 17 ug/mL — ABNORMAL LOW (ref 50.0–100.0)

## 2023-01-08 MED ORDER — METOCLOPRAMIDE HCL 5 MG/5ML PO SOLN
10.0000 mg | Freq: Four times a day (QID) | ORAL | Status: DC
  Administered 2023-01-08 – 2023-01-15 (×28): 10 mg
  Filled 2023-01-08 (×30): qty 10

## 2023-01-08 MED ORDER — ORAL CARE MOUTH RINSE
15.0000 mL | OROMUCOSAL | Status: DC
  Administered 2023-01-08 – 2023-01-14 (×22): 15 mL via OROMUCOSAL

## 2023-01-08 MED ORDER — CHLORHEXIDINE GLUCONATE CLOTH 2 % EX PADS
6.0000 | MEDICATED_PAD | Freq: Every day | CUTANEOUS | Status: DC
  Administered 2023-01-08 – 2023-01-17 (×8): 6 via TOPICAL

## 2023-01-08 MED ORDER — GLYCOPYRROLATE 0.2 MG/ML IJ SOLN
0.1000 mg | Freq: Two times a day (BID) | INTRAMUSCULAR | Status: DC
  Administered 2023-01-08 – 2023-01-30 (×46): 0.1 mg via INTRAVENOUS
  Filled 2023-01-08 (×47): qty 1
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
## 2023-01-08 NOTE — Progress Notes (Signed)
Per night shift RN, tube feeds held overnight due to vomiting.   Per Janee Morn MD, given orders to resume tube feeds at 1700.   Advised by dietician Herbert Seta to start back tube feeds at rate 20 mL/hr and increase by 10 mL q4h until goal rate met (75 mL/h). Will notify night shift RN.

## 2023-01-08 NOTE — Plan of Care (Signed)
  Problem: Clinical Measurements: Goal: Ability to maintain clinical measurements within normal limits will improve Outcome: Progressing Goal: Diagnostic test results will improve Outcome: Progressing Goal: Respiratory complications will improve Outcome: Progressing Goal: Cardiovascular complication will be avoided Outcome: Progressing   Problem: Activity: Goal: Risk for activity intolerance will decrease Outcome: Progressing   Problem: Elimination: Goal: Will not experience complications related to bowel motility Outcome: Progressing Goal: Will not experience complications related to urinary retention Outcome: Progressing   Problem: Pain Managment: Goal: General experience of comfort will improve Outcome: Progressing   Problem: Safety: Goal: Ability to remain free from injury will improve Outcome: Progressing   Problem: Education: Goal: Knowledge of the prescribed therapeutic regimen will improve Outcome: Not Progressing   Problem: Clinical Measurements: Goal: Usual level of consciousness will be regained or maintained. Outcome: Not Progressing Goal: Neurologic status will improve Outcome: Not Progressing   Problem: Skin Integrity: Goal: Demonstration of wound healing without infection will improve Outcome: Not Progressing   Problem: Clinical Measurements: Goal: Will remain free from infection Outcome: Not Progressing   Problem: Nutrition: Goal: Adequate nutrition will be maintained Outcome: Not Progressing   Problem: Skin Integrity: Goal: Risk for impaired skin integrity will decrease Outcome: Not Progressing
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
## 2023-01-09 LAB — GLUCOSE, CAPILLARY
Glucose-Capillary: 103 mg/dL — ABNORMAL HIGH (ref 70–99)
Glucose-Capillary: 107 mg/dL — ABNORMAL HIGH (ref 70–99)
Glucose-Capillary: 114 mg/dL — ABNORMAL HIGH (ref 70–99)
Glucose-Capillary: 90 mg/dL (ref 70–99)
Glucose-Capillary: 105 mg/dL — ABNORMAL HIGH (ref 70–99)

## 2023-01-09 LAB — URINALYSIS, ROUTINE W REFLEX MICROSCOPIC
Glucose, UA: NEGATIVE mg/dL
Hgb urine dipstick: NEGATIVE
Ketones, ur: NEGATIVE mg/dL
Leukocytes,Ua: NEGATIVE
Nitrite: NEGATIVE
Protein, ur: NEGATIVE mg/dL
Specific Gravity, Urine: 1.032 — ABNORMAL HIGH (ref 1.005–1.030)
pH: 5 (ref 5.0–8.0)

## 2023-01-09 LAB — CBC
HCT: 25.6 % — ABNORMAL LOW (ref 39.0–52.0)
Hemoglobin: 8.2 g/dL — ABNORMAL LOW (ref 13.0–17.0)
MCH: 29.2 pg (ref 26.0–34.0)
MCHC: 32 g/dL (ref 30.0–36.0)
MCV: 91.1 fL (ref 80.0–100.0)
Platelets: 855 10*3/uL — ABNORMAL HIGH (ref 150–400)
RBC: 2.81 MIL/uL — ABNORMAL LOW (ref 4.22–5.81)
RDW: 12.8 % (ref 11.5–15.5)
WBC: 8.2 10*3/uL (ref 4.0–10.5)
nRBC: 0 % (ref 0.0–0.2)

## 2023-01-09 LAB — CULTURE, RESPIRATORY W GRAM STAIN: Culture: NORMAL

## 2023-01-09 MED ORDER — PROCHLORPERAZINE EDISYLATE 10 MG/2ML IJ SOLN
10.0000 mg | Freq: Four times a day (QID) | INTRAMUSCULAR | Status: DC
  Administered 2023-01-09 – 2023-01-10 (×4): 10 mg via INTRAVENOUS
  Filled 2023-01-09 (×4): qty 2

## 2023-01-09 NOTE — Progress Notes (Signed)
Staples from craniotomy site and drain site removed without complication. Incision healing well w/o signs of infection.   Rita Prom Margaree Mackintosh, PA-C

## 2023-01-09 NOTE — TOC Progression Note (Signed)
Transition of Care [facility]) - Progression Note    Patient Details  Name: Alexander Fritz MRN: [id number] Date of Birth: 04-29-1991  [facility] of [facility]) CM/SW Contact  Glennon Mac, RN Phone Number: 01/09/2023, 10:02 AM  Clinical Narrative:    Patient has been discussed in multidisciplinary Trauma Rounds.  Patient may be a candidate for long-term acute care hospital and referrals have been made to both area [facility] hospitals.  Will follow with updates when available regarding eligibility.     Barriers to Discharge: Continued Medical Work up  Expected Discharge Plan and Services In-house Referral: Clinical Social Work     Living arrangements for the past 2 months: Group Home                                       Social Determinants of Health (SDOH) Interventions    Readmission Risk Interventions     No data to display         Quintella Baton, RN, BSN  Trauma/Neuro ICU Case Manager [phone]
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
## 2023-01-10 LAB — CBC
HCT: 26.5 % — ABNORMAL LOW (ref 39.0–52.0)
Hemoglobin: 8.2 g/dL — ABNORMAL LOW (ref 13.0–17.0)
MCH: 28.8 pg (ref 26.0–34.0)
MCHC: 30.9 g/dL (ref 30.0–36.0)
MCV: 93 fL (ref 80.0–100.0)
Platelets: 900 10*3/uL — ABNORMAL HIGH (ref 150–400)
RBC: 2.85 MIL/uL — ABNORMAL LOW (ref 4.22–5.81)
RDW: 13 % (ref 11.5–15.5)
WBC: 8.2 10*3/uL (ref 4.0–10.5)
nRBC: 0 % (ref 0.0–0.2)

## 2023-01-10 LAB — GLUCOSE, CAPILLARY
Glucose-Capillary: 103 mg/dL — ABNORMAL HIGH (ref 70–99)
Glucose-Capillary: 110 mg/dL — ABNORMAL HIGH (ref 70–99)
Glucose-Capillary: 110 mg/dL — ABNORMAL HIGH (ref 70–99)
Glucose-Capillary: 115 mg/dL — ABNORMAL HIGH (ref 70–99)
Glucose-Capillary: 124 mg/dL — ABNORMAL HIGH (ref 70–99)
Glucose-Capillary: 128 mg/dL — ABNORMAL HIGH (ref 70–99)
Glucose-Capillary: 142 mg/dL — ABNORMAL HIGH (ref 70–99)

## 2023-01-10 LAB — BASIC METABOLIC PANEL
Anion gap: 11 (ref 5–15)
BUN: 27 mg/dL — ABNORMAL HIGH (ref 6–20)
CO2: 26 mmol/L (ref 22–32)
Calcium: 8.6 mg/dL — ABNORMAL LOW (ref 8.9–10.3)
Chloride: 104 mmol/L (ref 98–111)
Creatinine, Ser: 0.53 mg/dL — ABNORMAL LOW (ref 0.61–1.24)
GFR, Estimated: >60 mL/min (ref >60)
Glucose, Bld: 116 mg/dL — ABNORMAL HIGH (ref 70–99)
Potassium: 4 mmol/L (ref 3.5–5.1)
Sodium: 141 mmol/L (ref 135–145)
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
## 2023-01-10 NOTE — TOC Progression Note (Signed)
Transition of Care [facility]) - Progression Note    Patient Details  Name: Alexander Fritz MRN: [id number] Date of Birth: 1990/10/29  Transition of Care [facility]) CM/SW Contact  Glennon Mac, RN Phone Number: 01/10/2023, 12:00 PM  Clinical Narrative:    Both area LTAC hospitals have expressed an interest in possible admission for patient.  Spoke with patient's guardian, Alexander Fritz, phone [phone] to discuss LTAC choice.  Explained need for [facility] hospital and provided names and addresses of the both area LTAC hospitals. She states she will discuss with her Board of decision makers and let me know what they decide.  Will follow with updates as they are available.    Barriers to Discharge: Continued Medical Work up  Expected Discharge Plan and Services In-house Referral: Clinical Social Work     Living arrangements for the past 2 months: Group Home                                       Social Determinants of Health (SDOH) Interventions    Readmission Risk Interventions     No data to display         Quintella Baton, RN, BSN  Trauma/Neuro ICU Case Manager [phone]

## 2023-01-10 NOTE — Plan of Care (Signed)
  Problem: Clinical Measurements: Goal: Usual level of consciousness will be regained or maintained. Outcome: Progressing   Problem: Skin Integrity: Goal: Demonstration of wound healing without infection will improve Outcome: Progressing   Problem: Clinical Measurements: Goal: Will remain free from infection Outcome: Progressing Goal: Diagnostic test results will improve Outcome: Progressing Goal: Respiratory complications will improve Outcome: Progressing   Problem: Nutrition: Goal: Adequate nutrition will be maintained Outcome: Progressing   Problem: Elimination: Goal: Will not experience complications related to bowel motility Outcome: Progressing   Problem: Pain Managment: Goal: General experience of comfort will improve Outcome: Progressing

## 2023-01-11 LAB — BASIC METABOLIC PANEL
Anion gap: 8 (ref 5–15)
BUN: 27 mg/dL — ABNORMAL HIGH (ref 6–20)
CO2: 26 mmol/L (ref 22–32)
Calcium: 8.7 mg/dL — ABNORMAL LOW (ref 8.9–10.3)
Chloride: 106 mmol/L (ref 98–111)
Creatinine, Ser: 0.61 mg/dL (ref 0.61–1.24)
GFR, Estimated: >60 mL/min (ref >60)
Glucose, Bld: 134 mg/dL — ABNORMAL HIGH (ref 70–99)
Potassium: 3.9 mmol/L (ref 3.5–5.1)
Sodium: 140 mmol/L (ref 135–145)

## 2023-01-11 LAB — GLUCOSE, CAPILLARY
Glucose-Capillary: 114 mg/dL — ABNORMAL HIGH (ref 70–99)
Glucose-Capillary: 108 mg/dL — ABNORMAL HIGH (ref 70–99)
Glucose-Capillary: 122 mg/dL — ABNORMAL HIGH (ref 70–99)
Glucose-Capillary: 125 mg/dL — ABNORMAL HIGH (ref 70–99)
Glucose-Capillary: 109 mg/dL — ABNORMAL HIGH (ref 70–99)
Glucose-Capillary: 104 mg/dL — ABNORMAL HIGH (ref 70–99)

## 2023-01-11 LAB — CBC
HCT: 25.8 % — ABNORMAL LOW (ref 39.0–52.0)
Hemoglobin: 8.1 g/dL — ABNORMAL LOW (ref 13.0–17.0)
MCH: 29.6 pg (ref 26.0–34.0)
MCHC: 31.4 g/dL (ref 30.0–36.0)
MCV: 94.2 fL (ref 80.0–100.0)
Platelets: 944 10*3/uL (ref 150–400)
RBC: 2.74 MIL/uL — ABNORMAL LOW (ref 4.22–5.81)
RDW: 13.1 % (ref 11.5–15.5)
WBC: 10.9 10*3/uL — ABNORMAL HIGH (ref 4.0–10.5)
nRBC: 0.2 % (ref 0.0–0.2)
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
## 2023-01-11 NOTE — Progress Notes (Signed)
   01/11/23 0756  Provider Notification  Provider Name/Title Hosie Spangle, PA-C  Date Provider Notified 01/11/23  Time Provider Notified [phone]  Method of Notification Page  Notification Reason Critical Result (Plt count 944)  Date Critical Result Received 01/11/23  Time Critical Result Received 0729  Provider response No new orders  Date of Provider Response 01/11/23  Time of Provider Response [phone]

## 2023-01-11 NOTE — Progress Notes (Signed)
   01/11/23 1100  Assess: MEWS Score  Temp 100.1 F (37.8 C) (ice packs)  BP 116/82  MAP (mmHg) 91  Pulse Rate (!) 131  ECG Heart Rate (!) 132  Resp (!) 24  Level of Consciousness Alert  SpO2 95 %  O2 Device Tracheostomy Collar  O2 Flow Rate (L/min) 6 L/min  FiO2 (%) 28 %  Assess: MEWS Score  MEWS Temp 0  MEWS Systolic 0  MEWS Pulse 3  MEWS RR 1  MEWS LOC 0  MEWS Score 4  MEWS Score Color Red  Assess: if the MEWS score is Yellow or Red  MEWS guidelines implemented  Yes, red  Treat  MEWS Interventions Considered administering scheduled or prn medications/treatments as ordered  Take Vital Signs  Increase Vital Sign Frequency  Red: Q1hr x2, continue Q4hrs until patient remains green for 12hrs  Escalate  MEWS: Escalate Red: Discuss with charge nurse and notify provider. Consider notifying RRT. If remains red for 2 hours consider need for higher level of care  Notify: Charge Nurse/RN  Name of Charge Nurse/RN Notified Lennox Pippins, RN  Provider Notification  Provider Name/Title E. Simaan PA-C  Date Provider Notified 01/11/23  Time Provider Notified 1155  Method of Notification Page (Secure Chat)  Notification Reason Other (Comment) (Red MEWS)  Provider response Evaluate remotely;No new orders  Date of Provider Response 01/11/23  Time of Provider Response 1155  Assess: SIRS CRITERIA  SIRS Temperature  0  SIRS Pulse 1  SIRS Respirations  1  SIRS WBC 0  SIRS Score Sum  2

## 2023-01-12 LAB — BASIC METABOLIC PANEL
Anion gap: 11 (ref 5–15)
BUN: 26 mg/dL — ABNORMAL HIGH (ref 6–20)
CO2: 25 mmol/L (ref 22–32)
Calcium: 8.8 mg/dL — ABNORMAL LOW (ref 8.9–10.3)
Chloride: 103 mmol/L (ref 98–111)
Creatinine, Ser: 0.48 mg/dL — ABNORMAL LOW (ref 0.61–1.24)
GFR, Estimated: >60 mL/min (ref >60)
Glucose, Bld: 126 mg/dL — ABNORMAL HIGH (ref 70–99)
Potassium: 4.1 mmol/L (ref 3.5–5.1)
Sodium: 139 mmol/L (ref 135–145)

## 2023-01-12 LAB — CBC
HCT: 27.3 % — ABNORMAL LOW (ref 39.0–52.0)
Hemoglobin: 8.6 g/dL — ABNORMAL LOW (ref 13.0–17.0)
MCH: 28.8 pg (ref 26.0–34.0)
MCHC: 31.5 g/dL (ref 30.0–36.0)
MCV: 91.3 fL (ref 80.0–100.0)
Platelets: 905 10*3/uL (ref 150–400)
RBC: 2.99 MIL/uL — ABNORMAL LOW (ref 4.22–5.81)
RDW: 13.2 % (ref 11.5–15.5)
WBC: 10.3 10*3/uL (ref 4.0–10.5)
nRBC: 0 % (ref 0.0–0.2)

## 2023-01-12 LAB — GLUCOSE, CAPILLARY
Glucose-Capillary: 116 mg/dL — ABNORMAL HIGH (ref 70–99)
Glucose-Capillary: 111 mg/dL — ABNORMAL HIGH (ref 70–99)
Glucose-Capillary: 120 mg/dL — ABNORMAL HIGH (ref 70–99)
Glucose-Capillary: 121 mg/dL — ABNORMAL HIGH (ref 70–99)
Glucose-Capillary: 122 mg/dL — ABNORMAL HIGH (ref 70–99)

## 2023-01-12 NOTE — Progress Notes (Signed)
RTX2 change pt trach from a #6 shiley cuffed to a #6 shiley uncuffed. Positive color change, trach secured with new trach ties and vitals stable throughout. Pt tolerated procedure well.
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
## 2023-01-13 LAB — GLUCOSE, CAPILLARY
Glucose-Capillary: 103 mg/dL — ABNORMAL HIGH (ref 70–99)
Glucose-Capillary: 116 mg/dL — ABNORMAL HIGH (ref 70–99)
Glucose-Capillary: 117 mg/dL — ABNORMAL HIGH (ref 70–99)
Glucose-Capillary: 118 mg/dL — ABNORMAL HIGH (ref 70–99)
Glucose-Capillary: 119 mg/dL — ABNORMAL HIGH (ref 70–99)
Glucose-Capillary: 121 mg/dL — ABNORMAL HIGH (ref 70–99)
Glucose-Capillary: 99 mg/dL (ref 70–99)
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
## 2023-01-13 NOTE — Plan of Care (Signed)
  Problem: Nutrition: Goal: Adequate nutrition will be maintained Outcome: Progressing   

## 2023-01-14 LAB — GLUCOSE, CAPILLARY
Glucose-Capillary: 122 mg/dL — ABNORMAL HIGH (ref 70–99)
Glucose-Capillary: 120 mg/dL — ABNORMAL HIGH (ref 70–99)
Glucose-Capillary: 113 mg/dL — ABNORMAL HIGH (ref 70–99)
Glucose-Capillary: 126 mg/dL — ABNORMAL HIGH (ref 70–99)
Glucose-Capillary: 130 mg/dL — ABNORMAL HIGH (ref 70–99)
Glucose-Capillary: 117 mg/dL — ABNORMAL HIGH (ref 70–99)

## 2023-01-14 MED ORDER — ORAL CARE MOUTH RINSE: 15.0000 mL | OROMUCOSAL | Status: DC | PRN

## 2023-01-14 MED ORDER — ORAL CARE MOUTH RINSE
15.0000 mL | OROMUCOSAL | Status: DC
  Administered 2023-01-14 – 2023-02-14 (×122): 15 mL via OROMUCOSAL
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
## 2023-01-15 LAB — GLUCOSE, CAPILLARY
Glucose-Capillary: 107 mg/dL — ABNORMAL HIGH (ref 70–99)
Glucose-Capillary: 114 mg/dL — ABNORMAL HIGH (ref 70–99)
Glucose-Capillary: 114 mg/dL — ABNORMAL HIGH (ref 70–99)
Glucose-Capillary: 118 mg/dL — ABNORMAL HIGH (ref 70–99)
Glucose-Capillary: 120 mg/dL — ABNORMAL HIGH (ref 70–99)
Glucose-Capillary: 128 mg/dL — ABNORMAL HIGH (ref 70–99)
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
## 2023-01-15 NOTE — TOC Progression Note (Signed)
Transition of Care [facility]) - Progression Note    Patient Details  Name: Terry Vandale MRN: [id number] Date of Birth: 1990-07-04  Transition of Care [facility]) CM/SW Contact  Glennon Mac, RN Phone Number: 01/15/2023, 12:47pm  Clinical Narrative:    Spoke with patient's guardian, Dorathy Daft, and she/committee prefer transfer to [facility].  Per Glee Arvin, admissions coordinator with [facility], patient still meets criteria for admission to LTAC.  Facility to [employer] authorization.  Will follow with updates as available.  Addendum: 1:01 PM LTAC admissions coordinator requesting Social Security number for patient, and it is not in demographic section.  Message sent to patient's guardian; she states she will call [facility] case manager with Social Security number ASAP.    Barriers to Discharge: Continued Medical Work up  Expected Discharge Plan and Services In-house Referral: Clinical Social Work     Living arrangements for the past 2 months: Group Home                                       Social Determinants of Health (SDOH) Interventions    Readmission Risk Interventions     No data to display         Quintella Baton, RN, BSN  Trauma/Neuro ICU Case Manager [phone]

## 2023-01-16 LAB — GLUCOSE, CAPILLARY
Glucose-Capillary: 114 mg/dL — ABNORMAL HIGH (ref 70–99)
Glucose-Capillary: 120 mg/dL — ABNORMAL HIGH (ref 70–99)
Glucose-Capillary: 120 mg/dL — ABNORMAL HIGH (ref 70–99)
Glucose-Capillary: 127 mg/dL — ABNORMAL HIGH (ref 70–99)
Glucose-Capillary: 131 mg/dL — ABNORMAL HIGH (ref 70–99)
Glucose-Capillary: 132 mg/dL — ABNORMAL HIGH (ref 70–99)

## 2023-01-16 MED ORDER — PROPRANOLOL HCL 10 MG PO TABS
20.0000 mg | ORAL_TABLET | Freq: Two times a day (BID) | ORAL | Status: DC
  Administered 2023-01-16 – 2023-01-18 (×5): 20 mg
  Filled 2023-01-16 (×2): qty 1
  Filled 2023-01-16 (×4): qty 2
  Filled 2023-01-16: qty 1
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
## 2023-01-16 NOTE — Care Management Important Message (Signed)
Important Message  Patient Details  Name: Miron Letts MRN: [id number] Date of Birth: 1990-11-04   Medicare Important Message Given:  Yes     Sherilyn Banker 01/16/2023, 12:58 PM
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
## 2023-01-17 LAB — GLUCOSE, CAPILLARY
Glucose-Capillary: 117 mg/dL — ABNORMAL HIGH (ref 70–99)
Glucose-Capillary: 129 mg/dL — ABNORMAL HIGH (ref 70–99)
Glucose-Capillary: 130 mg/dL — ABNORMAL HIGH (ref 70–99)
Glucose-Capillary: 123 mg/dL — ABNORMAL HIGH (ref 70–99)
Glucose-Capillary: 130 mg/dL — ABNORMAL HIGH (ref 70–99)
Glucose-Capillary: 113 mg/dL — ABNORMAL HIGH (ref 70–99)
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
## 2023-01-17 NOTE — Plan of Care (Signed)
  Problem: Education: Goal: Knowledge of the prescribed therapeutic regimen will improve Outcome: Not Progressing   Problem: Elimination: Goal: Will not experience complications related to urinary retention Outcome: Not Progressing   Problem: Safety: Goal: Ability to remain free from injury will improve Outcome: Not Progressing

## 2023-01-18 LAB — GLUCOSE, CAPILLARY
Glucose-Capillary: 102 mg/dL — ABNORMAL HIGH (ref 70–99)
Glucose-Capillary: 115 mg/dL — ABNORMAL HIGH (ref 70–99)
Glucose-Capillary: 117 mg/dL — ABNORMAL HIGH (ref 70–99)
Glucose-Capillary: 120 mg/dL — ABNORMAL HIGH (ref 70–99)
Glucose-Capillary: 123 mg/dL — ABNORMAL HIGH (ref 70–99)
Glucose-Capillary: 128 mg/dL — ABNORMAL HIGH (ref 70–99)

## 2023-01-18 MED ORDER — PROPRANOLOL HCL 10 MG PO TABS
20.0000 mg | ORAL_TABLET | Freq: Three times a day (TID) | ORAL | Status: DC
  Administered 2023-01-18 – 2023-02-05 (×54): 20 mg
  Filled 2023-01-18 (×54): qty 2
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
## 2023-01-18 NOTE — TOC Progression Note (Signed)
Transition of Care [facility]) - Progression Note    Patient Details  Name: Anes Fross MRN: [id number] Date of Birth: [birth year]  [facility] CM/SW Contact  Glennon Mac, RN Phone Number: 01/18/2023, 4:00pm  Clinical Narrative:    Patient's insurance denied LTAC, and peer to peer was offered.  Dr. Janee Morn completed peer to peer, and we now have approval for long-term acute care admission.  Per MD, patient medically stable for DC to LTAC on 01/19/2023; [facility] admissions coordinator checking bed availability for potential admission tomorrow.   Expected Discharge Plan: Long Term Acute Care (LTAC) Barriers to Discharge: Continued Medical Work up  Expected Discharge Plan and Services In-house Referral: Clinical Social Work   Post Acute Care Choice: Long Term Acute Care (LTAC) Living arrangements for the past 2 months: Group Home                                       Social Determinants of Health (SDOH) Interventions    Readmission Risk Interventions     No data to display         Quintella Baton, RN, BSN  Trauma/Neuro ICU Case Manager [phone]
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
## 2023-01-19 LAB — GLUCOSE, CAPILLARY
Glucose-Capillary: 109 mg/dL — ABNORMAL HIGH (ref 70–99)
Glucose-Capillary: 111 mg/dL — ABNORMAL HIGH (ref 70–99)
Glucose-Capillary: 120 mg/dL — ABNORMAL HIGH (ref 70–99)
Glucose-Capillary: 125 mg/dL — ABNORMAL HIGH (ref 70–99)
Glucose-Capillary: 132 mg/dL — ABNORMAL HIGH (ref 70–99)
Glucose-Capillary: 137 mg/dL — ABNORMAL HIGH (ref 70–99)
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
## 2023-01-19 NOTE — Plan of Care (Signed)
  Problem: Education: Goal: Knowledge of the prescribed therapeutic regimen will improve Outcome: Progressing   Problem: Clinical Measurements: Goal: Usual level of consciousness will be regained or maintained. Outcome: Progressing Goal: Neurologic status will improve Outcome: Progressing   Problem: Skin Integrity: Goal: Demonstration of wound healing without infection will improve Outcome: Progressing   Problem: Clinical Measurements: Goal: Ability to maintain clinical measurements within normal limits will improve Outcome: Progressing Goal: Will remain free from infection Outcome: Progressing Goal: Diagnostic test results will improve Outcome: Progressing Goal: Respiratory complications will improve Outcome: Progressing Goal: Cardiovascular complication will be avoided Outcome: Progressing   Problem: Activity: Goal: Risk for activity intolerance will decrease Outcome: Progressing   Problem: Nutrition: Goal: Adequate nutrition will be maintained Outcome: Progressing   Problem: Elimination: Goal: Will not experience complications related to bowel motility Outcome: Progressing Goal: Will not experience complications related to urinary retention Outcome: Progressing   Problem: Pain Managment: Goal: General experience of comfort will improve Outcome: Progressing   Problem: Safety: Goal: Ability to remain free from injury will improve Outcome: Progressing   Problem: Skin Integrity: Goal: Risk for impaired skin integrity will decrease Outcome: Progressing
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
## 2023-01-20 LAB — GLUCOSE, CAPILLARY
Glucose-Capillary: 117 mg/dL — ABNORMAL HIGH (ref 70–99)
Glucose-Capillary: 131 mg/dL — ABNORMAL HIGH (ref 70–99)
Glucose-Capillary: 114 mg/dL — ABNORMAL HIGH (ref 70–99)
Glucose-Capillary: 117 mg/dL — ABNORMAL HIGH (ref 70–99)
Glucose-Capillary: 113 mg/dL — ABNORMAL HIGH (ref 70–99)
Glucose-Capillary: 123 mg/dL — ABNORMAL HIGH (ref 70–99)
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
## 2023-01-20 NOTE — Plan of Care (Signed)
  Problem: Education: Goal: Knowledge of the prescribed therapeutic regimen will improve Outcome: Progressing   Problem: Clinical Measurements: Goal: Usual level of consciousness will be regained or maintained. Outcome: Progressing Goal: Neurologic status will improve Outcome: Progressing   Problem: Skin Integrity: Goal: Demonstration of wound healing without infection will improve Outcome: Progressing   Problem: Clinical Measurements: Goal: Ability to maintain clinical measurements within normal limits will improve Outcome: Progressing Goal: Will remain free from infection Outcome: Progressing Goal: Diagnostic test results will improve Outcome: Progressing Goal: Respiratory complications will improve Outcome: Progressing Goal: Cardiovascular complication will be avoided Outcome: Progressing   Problem: Activity: Goal: Risk for activity intolerance will decrease Outcome: Progressing   Problem: Nutrition: Goal: Adequate nutrition will be maintained Outcome: Progressing   Problem: Elimination: Goal: Will not experience complications related to bowel motility Outcome: Progressing Goal: Will not experience complications related to urinary retention Outcome: Progressing   Problem: Pain Managment: Goal: General experience of comfort will improve Outcome: Progressing   Problem: Safety: Goal: Ability to remain free from injury will improve Outcome: Progressing   Problem: Skin Integrity: Goal: Risk for impaired skin integrity will decrease Outcome: Progressing

## 2023-01-21 LAB — GLUCOSE, CAPILLARY
Glucose-Capillary: 114 mg/dL — ABNORMAL HIGH (ref 70–99)
Glucose-Capillary: 107 mg/dL — ABNORMAL HIGH (ref 70–99)
Glucose-Capillary: 107 mg/dL — ABNORMAL HIGH (ref 70–99)
Glucose-Capillary: 99 mg/dL (ref 70–99)
Glucose-Capillary: 86 mg/dL (ref 70–99)

## 2023-01-21 NOTE — Plan of Care (Signed)
  Problem: Clinical Measurements: Goal: Respiratory complications will improve Outcome: Progressing Goal: Cardiovascular complication will be avoided Outcome: Progressing   

## 2023-01-22 LAB — GLUCOSE, CAPILLARY
Glucose-Capillary: 101 mg/dL — ABNORMAL HIGH (ref 70–99)
Glucose-Capillary: 107 mg/dL — ABNORMAL HIGH (ref 70–99)
Glucose-Capillary: 105 mg/dL — ABNORMAL HIGH (ref 70–99)
Glucose-Capillary: 107 mg/dL — ABNORMAL HIGH (ref 70–99)
Glucose-Capillary: 106 mg/dL — ABNORMAL HIGH (ref 70–99)
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
## 2023-01-23 LAB — GLUCOSE, CAPILLARY
Glucose-Capillary: 133 mg/dL — ABNORMAL HIGH (ref 70–99)
Glucose-Capillary: 103 mg/dL — ABNORMAL HIGH (ref 70–99)
Glucose-Capillary: 92 mg/dL (ref 70–99)
Glucose-Capillary: 122 mg/dL — ABNORMAL HIGH (ref 70–99)
Glucose-Capillary: 117 mg/dL — ABNORMAL HIGH (ref 70–99)
Glucose-Capillary: 126 mg/dL — ABNORMAL HIGH (ref 70–99)
Glucose-Capillary: 125 mg/dL — ABNORMAL HIGH (ref 70–99)
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
## 2023-01-24 LAB — GLUCOSE, CAPILLARY
Glucose-Capillary: 134 mg/dL — ABNORMAL HIGH (ref 70–99)
Glucose-Capillary: 110 mg/dL — ABNORMAL HIGH (ref 70–99)
Glucose-Capillary: 105 mg/dL — ABNORMAL HIGH (ref 70–99)
Glucose-Capillary: 140 mg/dL — ABNORMAL HIGH (ref 70–99)

## 2023-01-24 NOTE — TOC Progression Note (Signed)
Transition of Care [facility]) - Progression Note    Patient Details  Name: Alexander Fritz MRN: [id number] Date of Birth: [birth year]  Transition of Care [facility]) CM/SW Contact  Glennon Mac, RN Phone Number: 01/24/2023, 1:57 PM  Clinical Narrative:    Update from [facility] on bed availability: Bed should be available by the end of the week, per Claudean Severance, admissions coordinator. Will follow with updates as available.    Expected Discharge Plan: Long Term Acute Care (LTAC) Barriers to Discharge: Continued Medical Work up  Expected Discharge Plan and Services In-house Referral: Clinical Social Work   Post Acute Care Choice: Long Term Acute Care (LTAC) Living arrangements for the past 2 months: Group Home                                       Social Determinants of Health (SDOH) Interventions    Readmission Risk Interventions     No data to display

## 2023-01-24 NOTE — Care Management Important Message (Signed)
Important Message  Patient Details  Name: Alexander Fritz MRN: [id number] Date of Birth: 1990/12/01   Medicare Important Message Given:  Yes     Sherilyn Banker 01/24/2023, 1:18 PM
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
## 2023-01-25 LAB — GLUCOSE, CAPILLARY
Glucose-Capillary: 100 mg/dL — ABNORMAL HIGH (ref 70–99)
Glucose-Capillary: 109 mg/dL — ABNORMAL HIGH (ref 70–99)
Glucose-Capillary: 110 mg/dL — ABNORMAL HIGH (ref 70–99)
Glucose-Capillary: 117 mg/dL — ABNORMAL HIGH (ref 70–99)
Glucose-Capillary: 118 mg/dL — ABNORMAL HIGH (ref 70–99)
Glucose-Capillary: 119 mg/dL — ABNORMAL HIGH (ref 70–99)
Glucose-Capillary: 122 mg/dL — ABNORMAL HIGH (ref 70–99)
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
## 2023-01-25 NOTE — Plan of Care (Signed)
  Problem: Education: Goal: Knowledge of the prescribed therapeutic regimen will improve Outcome: Progressing   Problem: Clinical Measurements: Goal: Usual level of consciousness will be regained or maintained. Outcome: Progressing Goal: Neurologic status will improve Outcome: Progressing   Problem: Skin Integrity: Goal: Demonstration of wound healing without infection will improve Outcome: Progressing   Problem: Clinical Measurements: Goal: Ability to maintain clinical measurements within normal limits will improve Outcome: Progressing Goal: Will remain free from infection Outcome: Progressing Goal: Diagnostic test results will improve Outcome: Progressing Goal: Respiratory complications will improve Outcome: Progressing Goal: Cardiovascular complication will be avoided Outcome: Progressing   Problem: Activity: Goal: Risk for activity intolerance will decrease Outcome: Progressing   Problem: Nutrition: Goal: Adequate nutrition will be maintained Outcome: Progressing   Problem: Elimination: Goal: Will not experience complications related to bowel motility Outcome: Progressing Goal: Will not experience complications related to urinary retention Outcome: Progressing   Problem: Pain Managment: Goal: General experience of comfort will improve Outcome: Progressing   Problem: Safety: Goal: Ability to remain free from injury will improve Outcome: Progressing   Problem: Skin Integrity: Goal: Risk for impaired skin integrity will decrease Outcome: Progressing
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
## 2023-01-26 LAB — GLUCOSE, CAPILLARY
Glucose-Capillary: 90 mg/dL (ref 70–99)
Glucose-Capillary: 101 mg/dL — ABNORMAL HIGH (ref 70–99)
Glucose-Capillary: 111 mg/dL — ABNORMAL HIGH (ref 70–99)
Glucose-Capillary: 109 mg/dL — ABNORMAL HIGH (ref 70–99)
Glucose-Capillary: 106 mg/dL — ABNORMAL HIGH (ref 70–99)
Glucose-Capillary: 103 mg/dL — ABNORMAL HIGH (ref 70–99)

## 2023-01-26 MED ORDER — BISACODYL 10 MG RE SUPP
10.0000 mg | Freq: Once | RECTAL | Status: AC
  Administered 2023-01-26: 10 mg via RECTAL
  Filled 2023-01-26: qty 1

## 2023-01-26 NOTE — Plan of Care (Signed)
  Problem: Education: Goal: Knowledge of General Education information will improve Description: Including pain rating scale, medication(s)/side effects and non-pharmacologic comfort measures 01/26/2023 1938 by Yvone Neu, RN Outcome: Progressing 01/26/2023 1938 by Yvone Neu, RN Outcome: Progressing   Problem: Health Behavior/Discharge Planning: Goal: Ability to manage health-related needs will improve Outcome: Progressing   Problem: Nutrition: Goal: Adequate nutrition will be maintained Outcome: Progressing   Problem: Coping: Goal: Level of anxiety will decrease Outcome: Progressing   Problem: Pain Managment: Goal: General experience of comfort will improve Outcome: Progressing   Problem: Safety: Goal: Ability to remain free from injury will improve Outcome: Progressing   Problem: Skin Integrity: Goal: Risk for impaired skin integrity will decrease Outcome: Progressing
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
## 2023-01-26 NOTE — TOC CM/SW Note (Signed)
CM checked with [facility] lia[facility] Latoya regarding bed availability for todayRadio producer is working on availability of beds for today and Glee Arvin will let CM know if bed is going to open up for today.

## 2023-01-27 LAB — GLUCOSE, CAPILLARY
Glucose-Capillary: 108 mg/dL — ABNORMAL HIGH (ref 70–99)
Glucose-Capillary: 109 mg/dL — ABNORMAL HIGH (ref 70–99)
Glucose-Capillary: 112 mg/dL — ABNORMAL HIGH (ref 70–99)
Glucose-Capillary: 112 mg/dL — ABNORMAL HIGH (ref 70–99)
Glucose-Capillary: 95 mg/dL (ref 70–99)
Glucose-Capillary: 97 mg/dL (ref 70–99)

## 2023-01-27 NOTE — Plan of Care (Signed)
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
## 2023-01-28 LAB — GLUCOSE, CAPILLARY
Glucose-Capillary: 100 mg/dL — ABNORMAL HIGH (ref 70–99)
Glucose-Capillary: 103 mg/dL — ABNORMAL HIGH (ref 70–99)
Glucose-Capillary: 108 mg/dL — ABNORMAL HIGH (ref 70–99)
Glucose-Capillary: 108 mg/dL — ABNORMAL HIGH (ref 70–99)
Glucose-Capillary: 113 mg/dL — ABNORMAL HIGH (ref 70–99)
Glucose-Capillary: 116 mg/dL — ABNORMAL HIGH (ref 70–99)

## 2023-01-28 NOTE — Plan of Care (Signed)
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
## 2023-01-29 LAB — GLUCOSE, CAPILLARY
Glucose-Capillary: 102 mg/dL — ABNORMAL HIGH (ref 70–99)
Glucose-Capillary: 108 mg/dL — ABNORMAL HIGH (ref 70–99)
Glucose-Capillary: 102 mg/dL — ABNORMAL HIGH (ref 70–99)
Glucose-Capillary: 92 mg/dL (ref 70–99)
Glucose-Capillary: 104 mg/dL — ABNORMAL HIGH (ref 70–99)

## 2023-01-29 NOTE — TOC Progression Note (Addendum)
Transition of Care (TOC) - Progression Note  Donn Pierini RN,BSN Transitions of Care Unit 4NP (Non Trauma)- RN Case Manager See Treatment Team for direct Phone # Coverage for Trauma  Patient Details  Name: Alexander Fritz MRN: [id number] Date of Birth: [birth year]  Transition of Care [facility]) CM/SW Contact  Zenda Alpers, Lenn Sink, RN Phone Number: 01/29/2023, 11:12 AM  Clinical Narrative:    Per message received from [facility]- Latoya this am- [facility] does not have any discharges again today and will not have a bed available to admit pt after review during their morning meeting today. Patient's Berkley Harvey will expire on 9/11.   CM will reach out to [facility] to see about bed availability and follow up with guardian.  1650- [facility] unable to offer bed for LTACH needs, TOC to follow up in the am w/ [facility]- may need to begin SNF bed search   Expected Discharge Plan: Long Term Acute Care (LTAC) Barriers to Discharge: Continued Medical Work up  Expected Discharge Plan and Services In-house Referral: Clinical Social Work   Post Acute Care Choice: Long Term Acute Care (LTAC) Living arrangements for the past 2 months: Group Home Expected Discharge Date: 01/26/23                                     Social Determinants of Health (SDOH) Interventions    Readmission Risk Interventions     No data to display

## 2023-01-29 NOTE — Care Management Important Message (Signed)
Important Message  Patient Details  Name: Alexander Fritz MRN: [id number] Date of Birth: 1990-11-29   Medicare Important Message Given:  Yes     Sherilyn Banker 01/29/2023, 1:16 PM

## 2023-01-29 NOTE — Procedures (Signed)
Tracheostomy Change Note  Patient Details:   Name: Alexander Fritz DOB: 04-13-1991 MRN: [id number]    Airway Documentation:     Evaluation  O2 sats: stable throughout Complications: No apparent complications Patient did tolerate procedure well. Bilateral Breath Sounds: Clear   Patient's trach changed from #6 Shiley cuffless to #4 Shiley cuffless trach without complications.  Positive color change noted.  Bilateral breath sounds auscultated.  Sats and vitals are stable.  Will continue to monitor.   Elyn Peers 01/29/2023, 12:37 PM
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
## 2023-01-30 ENCOUNTER — Inpatient Hospital Stay (HOSPITAL_COMMUNITY): Payer: 59

## 2023-01-30 LAB — GLUCOSE, CAPILLARY
Glucose-Capillary: 126 mg/dL — ABNORMAL HIGH (ref 70–99)
Glucose-Capillary: 105 mg/dL — ABNORMAL HIGH (ref 70–99)
Glucose-Capillary: 110 mg/dL — ABNORMAL HIGH (ref 70–99)
Glucose-Capillary: 127 mg/dL — ABNORMAL HIGH (ref 70–99)
Glucose-Capillary: 121 mg/dL — ABNORMAL HIGH (ref 70–99)
Glucose-Capillary: 110 mg/dL — ABNORMAL HIGH (ref 70–99)
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
## 2023-01-30 NOTE — Plan of Care (Signed)

## 2023-01-30 NOTE — TOC Progression Note (Addendum)
Transition of Care [facility]) - Progression Note    Patient Details  Name: Alexander Fritz MRN: [id number] Date of Birth: [birth year]  Transition of Care [facility]) CM/SW Contact  Glennon Mac, RN Phone Number: 01/30/2023, 1022am  Clinical Narrative:    Spoke with Latoya in [facility] at [facility]; she states there are is no bed availability at facility, and none is expected.  She apologizes, but states it appears patient will need to seek other disposition.  Patient's insurance authorization will terminate tomorrow. Patient is reaching the 30-day Melody status post trach; will fax out for skilled nursing facility in the [place] area.  Will call patient's guardian to inform her of events.  Form  Addendum: 1:58 PM Updated [name], patient's guardian regarding change in plan for discharge.  She is agreeable with plan to fax patient information out for SNF bed search.  Will provide updates as they are available.   Expected Discharge Plan: Skilled Nursing Facility Barriers to Discharge: Continued Medical Work up  Expected Discharge Plan and Services In-house Referral: Clinical Social Work   Post Acute Care Choice: Long Term Acute Care (LTAC) Living arrangements for the past 2 months: Group Home Expected Discharge Date: 01/26/23                                     Social Determinants of Health (SDOH) Interventions    Readmission Risk Interventions     No data to display         Quintella Baton, RN, BSN  Trauma/Neuro ICU Case Manager [phone]

## 2023-01-30 NOTE — Progress Notes (Signed)
Learned from RN during routine cares that patient pulled out his trach. Unknown amount of time it was out but last reported per nursing that trach was in once in the room at 2am. On my arrival noted no respiratory or ventilatory compromise. Patient breathing in equal unlabored respirations. Saturations were stable on RA.  Janina Mayo was placed back into the stoma using obturator and lube without difficulty or resistance. Positive color change on CO2 detector was clearly evident. Janina Mayo ties were changed. Patient is stable at this time no distress noted.   Jocabed Cheese L. Katrinka Blazing, BS, RRT-ACCS, RCP
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
## 2023-01-31 LAB — GLUCOSE, CAPILLARY
Glucose-Capillary: 116 mg/dL — ABNORMAL HIGH (ref 70–99)
Glucose-Capillary: 104 mg/dL — ABNORMAL HIGH (ref 70–99)
Glucose-Capillary: 103 mg/dL — ABNORMAL HIGH (ref 70–99)
Glucose-Capillary: 103 mg/dL — ABNORMAL HIGH (ref 70–99)
Glucose-Capillary: 113 mg/dL — ABNORMAL HIGH (ref 70–99)
Glucose-Capillary: 137 mg/dL — ABNORMAL HIGH (ref 70–99)
Glucose-Capillary: 103 mg/dL — ABNORMAL HIGH (ref 70–99)
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
## 2023-01-31 NOTE — Progress Notes (Signed)
   01/31/23 2039  Therapy Vitals  Patient Position (if appropriate) Lying  Respiratory Assessment  Assessment Type Assess only  Respiratory Pattern Regular;Unlabored  Chest Assessment Chest expansion symmetrical  Cough None  Bilateral Breath Sounds Diminished  Oxygen Therapy/Pulse Ox  O2 Device Room Air  Tracheostomy Shiley Flexible 4 mm Uncuffed  Placement Date/Time: 01/29/23 1235   Expiration Date: 02/12/23  Placed By: Self  Brand: Shiley Flexible  Size (mm): 4 mm  Style: Uncuffed  Status Capped  Site Assessment Clean;Dry  Site Care Dried  Inner Cannula Care Cleansed/dried  Ties Assessment Clean, Dry  Tracheostomy Equipment at bedside Yes and checklist posted at head of bed  Respiratory  Airway LDA Tracheostomy   Pt continues to be capped

## 2023-01-31 NOTE — Progress Notes (Signed)
Patient's trach capped per MD order.  Patient's sats and vitals are stable at this time.  Will continue to monitor.

## 2023-02-01 LAB — GLUCOSE, CAPILLARY
Glucose-Capillary: 122 mg/dL — ABNORMAL HIGH (ref 70–99)
Glucose-Capillary: 116 mg/dL — ABNORMAL HIGH (ref 70–99)
Glucose-Capillary: 124 mg/dL — ABNORMAL HIGH (ref 70–99)
Glucose-Capillary: 123 mg/dL — ABNORMAL HIGH (ref 70–99)
Glucose-Capillary: 108 mg/dL — ABNORMAL HIGH (ref 70–99)
Glucose-Capillary: 107 mg/dL — ABNORMAL HIGH (ref 70–99)

## 2023-02-01 NOTE — Plan of Care (Signed)
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
## 2023-02-01 NOTE — Plan of Care (Signed)
  Problem: Education: Goal: Knowledge of General Education information will improve Description: Including pain rating scale, medication(s)/side effects and non-pharmacologic comfort measures 02/01/2023 0418 by Elijah Birk, RN Outcome: Progressing 02/01/2023 0417 by Elijah Birk, RN Outcome: Progressing   Problem: Health Behavior/Discharge Planning: Goal: Ability to manage health-related needs will improve 02/01/2023 0418 by Elijah Birk, RN Outcome: Progressing 02/01/2023 0417 by Elijah Birk, RN Outcome: Progressing   Problem: Nutrition: Goal: Adequate nutrition will be maintained Outcome: Progressing   Problem: Pain Managment: Goal: General experience of comfort will improve Outcome: Progressing
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
## 2023-02-01 NOTE — NC FL2 (Addendum)
[place] MEDICAID FL2 LEVEL OF CARE FORM     IDENTIFICATION  Patient Name: Alexander Fritz Birthdate: October 24, 1990 Sex: male Admission Date (Current Location): 12/24/2022  [place] and [place] Number:  Haynes Bast [id number] R Facility and Address:  [facility], [address] [place], [place] [address]      Provider Number: [id number]  Attending Physician Name and Address:  Dr. Violeta Gelinas [address], [place] [address]  Relative Name and Phone Number:  [name], legal guardian: phone, [phone]    Current Level of Care: Hospital Recommended Level of Care: Skilled Nursing Facility Prior Approval Number:    Date Approved/Denied:   PASRR Number: [id number]  Discharge Plan: SNF    Current Diagnoses: Patient Active Problem List   Diagnosis Date Noted   TBI (traumatic brain injury) (HCC) 12/24/2022    Orientation RESPIRATION BLADDER Height & Weight      (Nonverbal)  Normal (Room Air; NO TRACHEOSTOMY-REMOVED 02/01/2023) External catheter Weight: 66.5 kg Height:  5\' 9"  (175.3 cm)  BEHAVIORAL SYMPTOMS/MOOD NEUROLOGICAL BOWEL NUTRITION STATUS      Incontinent Diet (NPO; PEG tube with feeding supplement (PIVOT 1.5 CAL) liquid 1,000 mL)  AMBULATORY STATUS COMMUNICATION OF NEEDS Skin   Total Care Does not communicate Other (Comment) (healed laceration Rt eye; tracheostomy site dressing; PEG dressing)                       Personal Care Assistance Level of Assistance  Bathing, Feeding, Dressing, Total care Bathing Assistance: Maximum assistance Feeding assistance: Maximum assistance Dressing Assistance: Maximum assistance Total Care Assistance: Maximum assistance   Functional Limitations Info             SPECIAL CARE FACTORS FREQUENCY                       Contractures      Additional Factors Info  Code Status, Allergies Code Status Info: Full Code Allergies Info: Not on file           Current Medications  (02/01/2023):  This is the current hospital active medication list Current Facility-Administered Medications  Medication Dose Route Frequency Provider Last Rate Last Admin   acetaminophen (TYLENOL) tablet 1,000 mg  1,000 mg Per Tube Q6H Violeta Gelinas, MD   1,000 mg at 02/01/23 1527   docusate (COLACE) 50 MG/5ML liquid 100 mg  100 mg Per Tube Daily Diamantina Monks, MD   100 mg at 01/30/23 1002   enoxaparin (LOVENOX) injection 30 mg  30 mg Subcutaneous Q12H Diamantina Monks, MD   30 mg at 02/01/23 0830   feeding supplement (PIVOT 1.5 CAL) liquid 1,000 mL  1,000 mL Per Tube Continuous Violeta Gelinas, MD 75 mL/hr at 02/01/23 1528 Infusion Verify at 02/01/23 1528   hydrALAZINE (APRESOLINE) injection 10 mg  10 mg Intravenous Q2H PRN Violeta Gelinas, MD       hydrOXYzine (ATARAX) tablet 50 mg  50 mg Per Tube TID Diamantina Monks, MD   50 mg at 02/01/23 1527   ibuprofen (ADVIL) 100 MG/5ML suspension 600 mg  600 mg Per Tube Q6H PRN Diamantina Monks, MD   600 mg at 01/10/23 2103   ondansetron (ZOFRAN-ODT) disintegrating tablet 4 mg  4 mg Oral Q6H PRN Violeta Gelinas, MD       Or   ondansetron [facility]) injection 4 mg  4 mg Intravenous Q6H PRN Violeta Gelinas, MD       Oral  care mouth rinse  15 mL Mouth Rinse PRN Gaynelle Adu, MD   15 mL at 01/10/23 0135   Oral care mouth rinse  15 mL Mouth Rinse 4 times per day Emelia Loron, MD   15 mL at 02/01/23 1528   Oral care mouth rinse  15 mL Mouth Rinse PRN Emelia Loron, MD       oxyCODONE (Oxy IR/ROXICODONE) immediate release tablet 5-10 mg  5-10 mg Per Tube Q4H PRN Violeta Gelinas, MD   10 mg at 02/01/23 0830   pantoprazole (PROTONIX) injection 40 mg  40 mg Intravenous QHS Dawley, Troy C, DO   40 mg at 01/31/23 2145   polyethylene glycol (MIRALAX / GLYCOLAX) packet 17 g  17 g Per Tube Daily PRN Violeta Gelinas, MD   17 g at 01/25/23 0930   promethazine (PHENERGAN) tablet 12.5-25 mg  12.5-25 mg Per Tube Q4H PRN Diamantina Monks, MD       propranolol  (INDERAL) tablet 20 mg  20 mg Per Tube TID Jacinto Halim, PA-C   20 mg at 02/01/23 1527   risperiDONE (RISPERDAL) tablet 2 mg  2 mg Per Tube Daily Diamantina Monks, MD   2 mg at 02/01/23 0830   trihexyphenidyl (ARTANE) tablet 5 mg  5 mg Per Tube BID Diamantina Monks, MD   5 mg at 02/01/23 0830   valproic acid (DEPAKENE) 250 MG/5ML solution 500 mg  500 mg Per Tube Q8H Diamantina Monks, MD   500 mg at 02/01/23 1528     Discharge Medications: Please see discharge summary for a list of discharge medications.  Relevant Imaging Results:  Relevant Lab Results:   Additional Information Patient has legal guardian; SSN [id number]  Quintella Baton, RN, BSN  Trauma/Neuro ICU Case Manager [phone]

## 2023-02-01 NOTE — Progress Notes (Signed)
Patient was de cannulated at 07:33 AM with 2 RT present without any complications. Stoma site was covered with gauze & soft tape.

## 2023-02-02 LAB — GLUCOSE, CAPILLARY
Glucose-Capillary: 102 mg/dL — ABNORMAL HIGH (ref 70–99)
Glucose-Capillary: 128 mg/dL — ABNORMAL HIGH (ref 70–99)
Glucose-Capillary: 114 mg/dL — ABNORMAL HIGH (ref 70–99)
Glucose-Capillary: 109 mg/dL — ABNORMAL HIGH (ref 70–99)
Glucose-Capillary: 113 mg/dL — ABNORMAL HIGH (ref 70–99)
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
## 2023-02-02 NOTE — Plan of Care (Signed)
  Problem: Education: Goal: Knowledge of General Education information will improve Description: Including pain rating scale, medication(s)/side effects and non-pharmacologic comfort measures Outcome: Progressing   Problem: Health Behavior/Discharge Planning: Goal: Ability to manage health-related needs will improve Outcome: Progressing   Problem: Education: Goal: Knowledge of General Education information will improve Description: Including pain rating scale, medication(s)/side effects and non-pharmacologic comfort measures Outcome: Progressing   Problem: Coping: Goal: Level of anxiety will decrease Outcome: Progressing

## 2023-02-03 LAB — GLUCOSE, CAPILLARY
Glucose-Capillary: 111 mg/dL — ABNORMAL HIGH (ref 70–99)
Glucose-Capillary: 89 mg/dL (ref 70–99)
Glucose-Capillary: 99 mg/dL (ref 70–99)
Glucose-Capillary: 104 mg/dL — ABNORMAL HIGH (ref 70–99)
Glucose-Capillary: 97 mg/dL (ref 70–99)
Glucose-Capillary: 86 mg/dL (ref 70–99)
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
## 2023-02-04 LAB — GLUCOSE, CAPILLARY
Glucose-Capillary: 118 mg/dL — ABNORMAL HIGH (ref 70–99)
Glucose-Capillary: 115 mg/dL — ABNORMAL HIGH (ref 70–99)
Glucose-Capillary: 91 mg/dL (ref 70–99)
Glucose-Capillary: 93 mg/dL (ref 70–99)
Glucose-Capillary: 96 mg/dL (ref 70–99)
Glucose-Capillary: 99 mg/dL (ref 70–99)

## 2023-02-04 NOTE — Progress Notes (Signed)
Patient did not void since 0800. Bladder scan showed and I+O cathed out . Gave Miralax PRN since last documented BM was 01/29/23.
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
## 2023-02-05 LAB — GLUCOSE, CAPILLARY
Glucose-Capillary: 126 mg/dL — ABNORMAL HIGH (ref 70–99)
Glucose-Capillary: 85 mg/dL (ref 70–99)
Glucose-Capillary: 84 mg/dL (ref 70–99)
Glucose-Capillary: 114 mg/dL — ABNORMAL HIGH (ref 70–99)
Glucose-Capillary: 86 mg/dL (ref 70–99)
Glucose-Capillary: 106 mg/dL — ABNORMAL HIGH (ref 70–99)
Glucose-Capillary: 96 mg/dL (ref 70–99)

## 2023-02-05 LAB — URINALYSIS, ROUTINE W REFLEX MICROSCOPIC
Bilirubin Urine: NEGATIVE
Glucose, UA: NEGATIVE mg/dL
Hgb urine dipstick: NEGATIVE
Ketones, ur: NEGATIVE mg/dL
Leukocytes,Ua: NEGATIVE
Nitrite: NEGATIVE
Protein, ur: NEGATIVE mg/dL
Specific Gravity, Urine: 1.023 (ref 1.005–1.030)
pH: 5 (ref 5.0–8.0)

## 2023-02-05 MED ORDER — BETHANECHOL 1 MG/ML PEDIATRIC ORAL SUSPENSION: 10.0000 mg | Freq: Three times a day (TID) | ORAL | Status: DC

## 2023-02-05 MED ORDER — BETHANECHOL CHLORIDE 10 MG PO TABS
10.0000 mg | ORAL_TABLET | Freq: Three times a day (TID) | ORAL | Status: DC
  Administered 2023-02-05: 10 mg
  Filled 2023-02-05: qty 1

## 2023-02-05 MED ORDER — PROPRANOLOL HCL 10 MG PO TABS
10.0000 mg | ORAL_TABLET | Freq: Three times a day (TID) | ORAL | Status: DC
  Administered 2023-02-05 (×2): 10 mg
  Filled 2023-02-05 (×2): qty 1

## 2023-02-05 MED ORDER — CHLORHEXIDINE GLUCONATE CLOTH 2 % EX PADS
6.0000 | MEDICATED_PAD | Freq: Every day | CUTANEOUS | Status: DC
  Administered 2023-02-06 – 2023-02-14 (×9): 6 via TOPICAL
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
## 2023-02-05 NOTE — Progress Notes (Signed)
Pt I/O x 3 continues to retain urine. Bladder scan revealed >355. Verbal order received to place foley per Dr. Janee Morn during AM rounds. Foley placed. MD notified to request order for urinary retention medication. Awaiting response.

## 2023-02-05 NOTE — Progress Notes (Signed)
   02/05/23 0839  Urine Characteristics  Urinary Interventions Bladder scan;Intermittent/Straight cath  Bladder Scan Volume (mL) 400 mL  Intermittent/Straight Cath (mL) 600 mL  Intermittent Catheter Size 14  Hygiene Peri care

## 2023-02-06 LAB — GLUCOSE, CAPILLARY
Glucose-Capillary: 81 mg/dL (ref 70–99)
Glucose-Capillary: 106 mg/dL — ABNORMAL HIGH (ref 70–99)
Glucose-Capillary: 97 mg/dL (ref 70–99)
Glucose-Capillary: 87 mg/dL (ref 70–99)
Glucose-Capillary: 96 mg/dL (ref 70–99)
Glucose-Capillary: 104 mg/dL — ABNORMAL HIGH (ref 70–99)

## 2023-02-06 MED ORDER — BETHANECHOL CHLORIDE 25 MG PO TABS
25.0000 mg | ORAL_TABLET | Freq: Three times a day (TID) | ORAL | Status: DC
  Administered 2023-02-06 – 2023-02-14 (×26): 25 mg
  Filled 2023-02-06 (×26): qty 1

## 2023-02-06 MED ORDER — PROPRANOLOL HCL 10 MG PO TABS
10.0000 mg | ORAL_TABLET | Freq: Two times a day (BID) | ORAL | Status: DC
  Administered 2023-02-06 (×2): 10 mg
  Filled 2023-02-06 (×2): qty 1
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
## 2023-02-07 LAB — CBC
HCT: 35.7 % — ABNORMAL LOW (ref 39.0–52.0)
Hemoglobin: 11.4 g/dL — ABNORMAL LOW (ref 13.0–17.0)
MCH: 28.1 pg (ref 26.0–34.0)
MCHC: 31.9 g/dL (ref 30.0–36.0)
MCV: 87.9 fL (ref 80.0–100.0)
Platelets: 339 10*3/uL (ref 150–400)
RBC: 4.06 MIL/uL — ABNORMAL LOW (ref 4.22–5.81)
RDW: 12.7 % (ref 11.5–15.5)
WBC: 5.7 10*3/uL (ref 4.0–10.5)
nRBC: 0 % (ref 0.0–0.2)

## 2023-02-07 LAB — GLUCOSE, CAPILLARY
Glucose-Capillary: 112 mg/dL — ABNORMAL HIGH (ref 70–99)
Glucose-Capillary: 92 mg/dL (ref 70–99)
Glucose-Capillary: 118 mg/dL — ABNORMAL HIGH (ref 70–99)
Glucose-Capillary: 106 mg/dL — ABNORMAL HIGH (ref 70–99)
Glucose-Capillary: 102 mg/dL — ABNORMAL HIGH (ref 70–99)

## 2023-02-07 LAB — BASIC METABOLIC PANEL
Anion gap: 14 (ref 5–15)
BUN: 20 mg/dL (ref 6–20)
CO2: 26 mmol/L (ref 22–32)
Calcium: 9.2 mg/dL (ref 8.9–10.3)
Chloride: 98 mmol/L (ref 98–111)
Creatinine, Ser: 0.44 mg/dL — ABNORMAL LOW (ref 0.61–1.24)
GFR, Estimated: >60 mL/min (ref >60)
Glucose, Bld: 114 mg/dL — ABNORMAL HIGH (ref 70–99)
Potassium: 4 mmol/L (ref 3.5–5.1)
Sodium: 138 mmol/L (ref 135–145)

## 2023-02-07 MED ORDER — OXYCODONE HCL 5 MG PO TABS
5.0000 mg | ORAL_TABLET | Freq: Four times a day (QID) | ORAL | Status: DC | PRN
  Administered 2023-02-07 – 2023-02-13 (×6): 5 mg
  Filled 2023-02-07 (×6): qty 1

## 2023-02-07 MED ORDER — IBUPROFEN 100 MG/5ML PO SUSP: 600.0000 mg | Freq: Four times a day (QID) | ORAL | Status: DC | PRN

## 2023-02-07 NOTE — TOC PASRR Note (Signed)
RE:  Alexander Fritz   Date of Birth:  [birth year] ___________  Date: 02/07/2023       To Whom It May Concern:  Please be advised that the above-named patient will require a short-term nursing home stay - anticipated 30 days or less for rehabilitation and strengthening.  The plan is for return home.         MD signature     _________ Date
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
## 2023-02-08 LAB — GLUCOSE, CAPILLARY
Glucose-Capillary: 105 mg/dL — ABNORMAL HIGH (ref 70–99)
Glucose-Capillary: 111 mg/dL — ABNORMAL HIGH (ref 70–99)
Glucose-Capillary: 85 mg/dL (ref 70–99)
Glucose-Capillary: 92 mg/dL (ref 70–99)
Glucose-Capillary: 95 mg/dL (ref 70–99)
Glucose-Capillary: 96 mg/dL (ref 70–99)
Glucose-Capillary: 96 mg/dL (ref 70–99)

## 2023-02-08 NOTE — TOC Progression Note (Signed)
Transition of Care [facility]) - Progression Note    Patient Details  Name: Alexander Fritz MRN: [id number] Date of Birth: [birth year]  Transition of Care [facility] CM/SW Contact  Glennon Mac, RN Phone Number: 02/08/2023, 12:00pm  Clinical Narrative:    Checked with Whitney in admissions at [facility]; she states that facility director is reviewing patient's case currently, and she will provide an update when available.  Faxed 30-day note, FL-2, and clinical information to [place] [facility] in an attempt to receive PASSR number.   Expected Discharge Plan: Skilled Nursing Facility Barriers to Discharge: Continued Medical Work up  Expected Discharge Plan and Services In-house Referral: Clinical Social Work   Post Acute Care Choice: Skilled Nursing Facility Living arrangements for the past 2 months: Group Home Expected Discharge Date: 01/26/23                                     Social Determinants of Health (SDOH) Interventions    Readmission Risk Interventions     No data to display         Quintella Baton, RN, BSN  Trauma/Neuro ICU Case Manager [phone]
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
## 2023-02-08 NOTE — Progress Notes (Signed)
At 0420, pt foley drainage bag contained 1500 mL of red cloudy urine. RN flushed foley with 20 CC of NS. Freida Busman MD was notified at [phone].

## 2023-02-09 LAB — GLUCOSE, CAPILLARY
Glucose-Capillary: 101 mg/dL — ABNORMAL HIGH (ref 70–99)
Glucose-Capillary: 111 mg/dL — ABNORMAL HIGH (ref 70–99)
Glucose-Capillary: 93 mg/dL (ref 70–99)
Glucose-Capillary: 95 mg/dL (ref 70–99)
Glucose-Capillary: 99 mg/dL (ref 70–99)
Glucose-Capillary: 99 mg/dL (ref 70–99)

## 2023-02-09 MED ORDER — CALCIUM POLYCARBOPHIL 625 MG PO TABS: 625.0000 mg | ORAL_TABLET | Freq: Every day | ORAL | Status: DC

## 2023-02-09 MED ORDER — CALCIUM POLYCARBOPHIL 625 MG PO TABS: 625.0000 mg | ORAL_TABLET | ORAL | Status: DC | PRN

## 2023-02-09 MED ORDER — DOCUSATE SODIUM 50 MG/5ML PO LIQD: 100.0000 mg | Freq: Every day | ORAL | Status: DC | PRN

## 2023-02-09 NOTE — Plan of Care (Signed)

## 2023-02-09 NOTE — Plan of Care (Signed)
Problem: Nutrition: Goal: Adequate nutrition will be maintained Outcome: Progressing   Problem: Coping: Goal: Level of anxiety will decrease Outcome: Progressing   Problem: Pain Managment: Goal: General experience of comfort will improve Outcome: Progressing   Problem: Safety: Goal: Ability to remain free from injury will improve Outcome: Progressing   Problem: Skin Integrity: Goal: Risk for impaired skin integrity will decrease Outcome: Progressing
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
## 2023-02-10 ENCOUNTER — Inpatient Hospital Stay (HOSPITAL_COMMUNITY): Payer: 59

## 2023-02-10 LAB — GLUCOSE, CAPILLARY
Glucose-Capillary: 85 mg/dL (ref 70–99)
Glucose-Capillary: 111 mg/dL — ABNORMAL HIGH (ref 70–99)
Glucose-Capillary: 99 mg/dL (ref 70–99)
Glucose-Capillary: 108 mg/dL — ABNORMAL HIGH (ref 70–99)
Glucose-Capillary: 88 mg/dL (ref 70–99)

## 2023-02-10 MED ORDER — DIATRIZOATE MEGLUMINE & SODIUM 66-10 % PO SOLN
45.0000 mL | Freq: Once | ORAL | Status: AC
  Administered 2023-02-10: 45 mL
  Filled 2023-02-10: qty 60

## 2023-02-10 NOTE — Plan of Care (Signed)
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
## 2023-02-10 NOTE — Progress Notes (Signed)
G-tube replaced bedside with 16 Fr G tube with 5cc saline in balloon.  Check X-ray bedside for placement
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
## 2023-02-11 LAB — GLUCOSE, CAPILLARY
Glucose-Capillary: 100 mg/dL — ABNORMAL HIGH (ref 70–99)
Glucose-Capillary: 106 mg/dL — ABNORMAL HIGH (ref 70–99)
Glucose-Capillary: 87 mg/dL (ref 70–99)
Glucose-Capillary: 98 mg/dL (ref 70–99)
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
## 2023-02-12 ENCOUNTER — Inpatient Hospital Stay (HOSPITAL_COMMUNITY): Payer: 59

## 2023-02-12 LAB — GLUCOSE, CAPILLARY
Glucose-Capillary: 103 mg/dL — ABNORMAL HIGH (ref 70–99)
Glucose-Capillary: 104 mg/dL — ABNORMAL HIGH (ref 70–99)
Glucose-Capillary: 87 mg/dL (ref 70–99)
Glucose-Capillary: 93 mg/dL (ref 70–99)
Glucose-Capillary: 99 mg/dL (ref 70–99)

## 2023-02-12 NOTE — Plan of Care (Signed)
  Problem: Education: Goal: Knowledge of General Education information will improve Description: Including pain rating scale, medication(s)/side effects and non-pharmacologic comfort measures Outcome: Progressing   Problem: Health Behavior/Discharge Planning: Goal: Ability to manage health-related needs will improve Outcome: Progressing   Problem: Pain Managment: Goal: General experience of comfort will improve Outcome: Progressing   Problem: Coping: Goal: Level of anxiety will decrease Outcome: Progressing   Problem: Pain Managment: Goal: General experience of comfort will improve Outcome: Progressing

## 2023-02-12 NOTE — TOC Progression Note (Signed)
Transition of Care [facility]) - Progression Note    Patient Details  Name: Alexander Fritz MRN: [id number] Date of Birth: 08-14-1990  [facility] CM/SW Contact  Glennon Mac, RN Phone Number: 02/12/2023, 4:13 PM  Clinical Narrative:    [name] with [facility] visited patient this afternoon; she states patient will have to be out of mittens and restraints prior to admission to SNF.  Hopeful for possible admission to [facility] pending acceptance; will follow up with Tammy on 02/13/2023.   Expected Discharge Plan: Skilled Nursing Facility Barriers to Discharge: Continued Medical Work up  Expected Discharge Plan and Services In-house Referral: Clinical Social Work   Post Acute Care Choice: Skilled Nursing Facility Living arrangements for the past 2 months: Group Home Expected Discharge Date: 01/26/23                                     Social Determinants of Health (SDOH) Interventions SDOH Screenings   Food Insecurity: Patient Unable To Answer (02/09/2023)  Housing: Patient Unable To Answer (02/09/2023)  Transportation Needs: Patient Unable To Answer (02/09/2023)  Utilities: Patient Unable To Answer (02/09/2023)    Readmission Risk Interventions     No data to display         Quintella Baton, RN, BSN  Trauma/Neuro ICU Case Manager [phone]
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
## 2023-02-12 NOTE — Plan of Care (Signed)
Problem: Nutrition: Goal: Adequate nutrition will be maintained Outcome: Progressing   Problem: Coping: Goal: Level of anxiety will decrease Outcome: Progressing   Problem: Pain Managment: Goal: General experience of comfort will improve Outcome: Progressing

## 2023-02-13 LAB — GLUCOSE, CAPILLARY
Glucose-Capillary: 101 mg/dL — ABNORMAL HIGH (ref 70–99)
Glucose-Capillary: 86 mg/dL (ref 70–99)
Glucose-Capillary: 99 mg/dL (ref 70–99)
Glucose-Capillary: 91 mg/dL (ref 70–99)
Glucose-Capillary: 95 mg/dL (ref 70–99)

## 2023-02-13 NOTE — Plan of Care (Signed)

## 2023-02-13 NOTE — Care Management Important Message (Signed)
Important Message  Patient Details  Name: Hamed Finkenbinder MRN: [id number] Date of Birth: 1991/05/07   Important Message Given:  Yes - Medicare IM     Sherilyn Banker 02/13/2023, 3:07 PM

## 2023-02-13 NOTE — TOC Progression Note (Addendum)
Transition of Care [facility]) - Progression Note    Patient Details  Name: Alexander Fritz MRN: [id number] Date of Birth: January 22, 1991  [facility] CM/SW Contact  Eduard Roux, [place] Phone Number: 02/13/2023, 2:04 PM  Clinical Narrative:     2:31 pm -Update: Tammy states maybe can admit on Wednesday- pending the patient has remains out of restraints for 48 hrs and has insurance approval- patient will need to be seen by PT for initial insurance approval.  RN updated     2:04 pm-CSW sent message to Tammy/[facility] to follow up if hey can make an offer - waiting on response.  Antony Blackbird, MSW, LCSW Clinical Social Worker    Expected Discharge Plan: Skilled Nursing Facility Barriers to Discharge: Continued Medical Work up  Expected Discharge Plan and Services In-house Referral: Clinical Social Work   Post Acute Care Choice: Skilled Nursing Facility Living arrangements for the past 2 months: Group Home Expected Discharge Date: 01/26/23                                     Social Determinants of Health (SDOH) Interventions SDOH Screenings   Food Insecurity: Patient Unable To Answer (02/09/2023)  Housing: Patient Unable To Answer (02/09/2023)  Transportation Needs: Patient Unable To Answer (02/09/2023)  Utilities: Patient Unable To Answer (02/09/2023)    Readmission Risk Interventions     No data to display

## 2023-02-13 NOTE — Plan of Care (Signed)
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
## 2023-02-14 LAB — GLUCOSE, CAPILLARY
Glucose-Capillary: 112 mg/dL — ABNORMAL HIGH (ref 70–99)
Glucose-Capillary: 101 mg/dL — ABNORMAL HIGH (ref 70–99)
Glucose-Capillary: 102 mg/dL — ABNORMAL HIGH (ref 70–99)
Glucose-Capillary: 91 mg/dL (ref 70–99)

## 2023-02-14 MED ORDER — PIVOT 1.5 CAL PO LIQD: 1000.0000 mL | ORAL | Status: DC

## 2023-02-14 MED ORDER — ACETAMINOPHEN 500 MG PO TABS: 1000.0000 mg | ORAL_TABLET | Freq: Three times a day (TID) | ORAL | Status: DC | PRN

## 2023-02-14 MED ORDER — CALCIUM POLYCARBOPHIL 625 MG PO TABS: 625.0000 mg | ORAL_TABLET | ORAL | Status: AC | PRN

## 2023-02-14 MED ORDER — HYDROXYZINE HCL 50 MG PO TABS: 50.0000 mg | ORAL_TABLET | Freq: Three times a day (TID) | ORAL | Status: DC | PRN

## 2023-02-14 MED ORDER — RISPERIDONE 2 MG PO TABS: 2.0000 mg | ORAL_TABLET | Freq: Every day | ORAL | Status: AC

## 2023-02-14 MED ORDER — VALPROIC ACID 250 MG/5ML PO SOLN: 500.0000 mg | Freq: Three times a day (TID) | ORAL | Status: AC

## 2023-02-14 MED ORDER — POLYETHYLENE GLYCOL 3350 17 G PO PACK: 17.0000 g | PACK | Freq: Every day | ORAL | Status: AC | PRN

## 2023-02-14 MED ORDER — BETHANECHOL CHLORIDE 25 MG PO TABS: 25.0000 mg | ORAL_TABLET | Freq: Three times a day (TID) | ORAL | Status: DC

## 2023-02-14 MED ORDER — OXYCODONE HCL 5 MG PO TABS: 5.0000 mg | ORAL_TABLET | Freq: Four times a day (QID) | ORAL | 0 refills | Status: DC | PRN

## 2023-02-14 MED ORDER — TRIHEXYPHENIDYL HCL 5 MG PO TABS: 5.0000 mg | ORAL_TABLET | Freq: Two times a day (BID) | ORAL | Status: AC

## 2023-02-14 MED ORDER — DOCUSATE SODIUM 50 MG/5ML PO LIQD: 100.0000 mg | Freq: Every day | ORAL | Status: AC | PRN
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
## 2023-02-14 NOTE — TOC Progression Note (Addendum)
Transition of Care [facility]) - Progression Note    Patient Details  Name: Alexander Fritz MRN: [id number] Date of Birth: 18-Aug-1990  [facility] CM/SW Contact  Glennon Mac, RN Phone Number: 02/14/2023, 12:00pm  Clinical Narrative:    Patient medically stable for discharge, and Faythe Casa has made bed offer for SNF.  Patient out of restraints x 48h and boot has been removed.  Will initiate insurance authorization for admission to facility.   Addendum: 1330 Mr Gaebler has been approved by [facility] for SNF admission; 9/26 - 9/28 NRD 9/28.  Notified Whitney in admissions of approval; she states patient can be accepted today by facility.  He will go to room 140P; bedside nurse will need to call report to [phone].    Addendum: 2:42pm Notified patient's guardian, [name] of plan for discharge to [facility] today.  She is appreciative of assistance. She has been given name, address, phone # and room # of facility.      Expected Discharge Plan: Skilled Nursing Facility Barriers to Discharge: Continued Medical Work up  Expected Discharge Plan and Services In-house Referral: Clinical Social Work   Post Acute Care Choice: Skilled Nursing Facility Living arrangements for the past 2 months: Group Home Expected Discharge Date: 01/26/23                                     Social Determinants of Health (SDOH) Interventions SDOH Screenings   Food Insecurity: Patient Unable To Answer (02/09/2023)  Housing: Patient Unable To Answer (02/09/2023)  Transportation Needs: Patient Unable To Answer (02/09/2023)  Utilities: Patient Unable To Answer (02/09/2023)    Readmission Risk Interventions     No data to display         Quintella Baton, RN, BSN  Trauma/Neuro ICU Case Manager [phone]

## 2023-02-14 NOTE — Plan of Care (Signed)

## 2023-02-15 ENCOUNTER — Encounter (HOSPITAL_COMMUNITY): Payer: Self-pay | Admitting: *Deleted

## 2023-04-16 ENCOUNTER — Encounter (HOSPITAL_COMMUNITY): Payer: Self-pay

## 2023-04-16 ENCOUNTER — Emergency Department (HOSPITAL_COMMUNITY)
Admission: EM | Admit: 2023-04-16 | Discharge: 2023-04-16 | Disposition: A | Discharge status: Home / Self Care | Payer: 59 | Attending: Emergency Medicine | Admitting: Emergency Medicine

## 2023-04-16 ENCOUNTER — Other Ambulatory Visit: Payer: Self-pay

## 2023-04-16 DIAGNOSIS — F840 Autistic disorder: Secondary | ICD-10-CM | POA: Diagnosis not present

## 2023-04-16 DIAGNOSIS — N3001 Acute cystitis with hematuria: Secondary | ICD-10-CM | POA: Diagnosis not present

## 2023-04-16 DIAGNOSIS — R1030 Lower abdominal pain, unspecified: Secondary | ICD-10-CM | POA: Diagnosis present

## 2023-04-16 LAB — URINALYSIS, W/ REFLEX TO CULTURE (INFECTION SUSPECTED)
Bilirubin Urine: NEGATIVE
Glucose, UA: NEGATIVE mg/dL
Ketones, ur: 5 mg/dL — AB
Nitrite: NEGATIVE
Protein, ur: 100 mg/dL — AB
RBC / HPF: >50 RBC/hpf (ref 0–5)
Specific Gravity, Urine: 1.019 (ref 1.005–1.030)
WBC, UA: >50 WBC/hpf (ref 0–5)
pH: 7 (ref 5.0–8.0)

## 2023-04-16 LAB — COMPREHENSIVE METABOLIC PANEL
ALT: 90 U/L — ABNORMAL HIGH (ref 0–44)
AST: 23 U/L (ref 15–41)
Albumin: 3.7 g/dL (ref 3.5–5.0)
Alkaline Phosphatase: 141 U/L — ABNORMAL HIGH (ref 38–126)
Anion gap: 12 (ref 5–15)
BUN: 7 mg/dL (ref 6–20)
CO2: 25 mmol/L (ref 22–32)
Calcium: 9.8 mg/dL (ref 8.9–10.3)
Chloride: 103 mmol/L (ref 98–111)
Creatinine, Ser: 0.63 mg/dL (ref 0.61–1.24)
GFR, Estimated: >60 mL/min (ref >60)
Glucose, Bld: 92 mg/dL (ref 70–99)
Potassium: 4.1 mmol/L (ref 3.5–5.1)
Sodium: 140 mmol/L (ref 135–145)
Total Bilirubin: 0.6 mg/dL (ref <1.2)
Total Protein: 7.8 g/dL (ref 6.5–8.1)

## 2023-04-16 LAB — CBC
HCT: 44.4 % (ref 39.0–52.0)
Hemoglobin: 14.8 g/dL (ref 13.0–17.0)
MCH: 27.4 pg (ref 26.0–34.0)
MCHC: 33.3 g/dL (ref 30.0–36.0)
MCV: 82.1 fL (ref 80.0–100.0)
Platelets: 334 10*3/uL (ref 150–400)
RBC: 5.41 MIL/uL (ref 4.22–5.81)
RDW: 13.2 % (ref 11.5–15.5)
WBC: 8.6 10*3/uL (ref 4.0–10.5)
nRBC: 0 % (ref 0.0–0.2)

## 2023-04-16 LAB — LIPASE, BLOOD: Lipase: 28 U/L (ref 11–51)

## 2023-04-16 MED ORDER — NITROFURANTOIN MONOHYD MACRO 100 MG PO CAPS
100.0000 mg | ORAL_CAPSULE | Freq: Once | ORAL | Status: AC
  Administered 2023-04-16: 100 mg via ORAL
  Filled 2023-04-16: qty 1

## 2023-04-16 MED ORDER — NITROFURANTOIN MONOHYD MACRO 100 MG PO CAPS: 100.0000 mg | ORAL_CAPSULE | Freq: Two times a day (BID) | ORAL | 0 refills | Status: AC

## 2023-04-16 MED ORDER — SULFAMETHOXAZOLE-TRIMETHOPRIM 800-160 MG PO TABS: 1.0000 | ORAL_TABLET | Freq: Once | ORAL | Status: DC

## 2023-04-16 NOTE — ED Triage Notes (Addendum)
Pt BIB EMS from [facility] for RLQ pain. Pt has urinary catheter in place and stated bloody discharge. Per EMS pt's baseline is altered.  VS 100 HR 16 RR 102/98 98% RA

## 2023-04-16 NOTE — ED Notes (Signed)
EDP notified of patient urinating around foley catheter with pain in the bladder area. Verbal order from EDP to bladder scan and take existing foley out.   Bladder scan >400,  RN removed foley and patient urinated a large amount.  Bladder scan post urinating 
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
## 2023-04-16 NOTE — ED Notes (Signed)
Ptar called pt is number 6 on the list ?

## 2023-04-16 NOTE — Discharge Instructions (Addendum)
Urine is positive for UTI. First dose Macrobid given in the ED. Take this medication twice a day for the next 7 days.  Your urine is being sent for culture.  You will receive a call from the hospital if the bacteria is not susceptible to the antibiotic, and you will be switched to a different medication at the time.  I do not hear from [place], continue the current antibiotic as prescribed.  Follow-up with your PCP in the next 5 to 7 days for reevaluation of your symptoms.  Get help right away if: You have very bad back pain. You have very bad pain in your lower belly. You have a fever. You have chills. You feeling like you will vomit or you vomit.

## 2023-04-16 NOTE — ED Notes (Signed)
Patient picked up by ptar at this time.

## 2023-04-16 NOTE — ED Notes (Signed)
[employer] operational officer to call ptar at this time. Per previous nurse krystal, attempted to contact [facility] and all legal guardians with no answer multiple times.

## 2023-04-18 LAB — URINE CULTURE

## 2023-04-19 ENCOUNTER — Telehealth (HOSPITAL_BASED_OUTPATIENT_CLINIC_OR_DEPARTMENT_OTHER): Payer: Self-pay | Admitting: *Deleted

## 2023-04-19 NOTE — Telephone Encounter (Signed)
Post ED Visit - Positive Culture Follow-up  Culture report reviewed by antimicrobial stewardship pharmacist: [facility] [x]  Anda Kraft, Pharm.D. []  Celedonio Miyamoto, Pharm.D., BCPS AQ-ID []  Garvin Fila, Pharm.D., BCPS []  Georgina Pillion, [address].D., BCPS []  [place], [place].D., BCPS, AAHIVP []  Estella Husk, Pharm.D., BCPS, AAHIVP []  Lysle Pearl, PharmD, BCPS []  Phillips Climes, PharmD, BCPS []  Agapito Games, PharmD, BCPS []  Verlan Friends, PharmD []  Mervyn Gay, PharmD, BCPS []  Vinnie Level, PharmD  [facility] []  Len Childs, PharmD []  Greer Pickerel, PharmD []  Adalberto Cole, PharmD []  Perlie Gold, Rph []  Lonell Face) Jean Rosenthal, PharmD []  Earl Many, PharmD []  Junita Push, PharmD []  Dorna Leitz, PharmD []  Terrilee Files, PharmD []  Lynann Beaver, PharmD []  Keturah Barre, PharmD []  Loralee Pacas, PharmD []  Bernadene Person, PharmD   Positive urine culture Treated with Nitrofurantoin Monohyd Macro, organism sensitive to the same and no further patient follow-up is required at this time.  [facility] 04/19/2023, 8:45 AM

## 2023-04-25 ENCOUNTER — Emergency Department (HOSPITAL_COMMUNITY): Payer: 59

## 2023-04-25 ENCOUNTER — Emergency Department (HOSPITAL_COMMUNITY)
Admission: EM | Admit: 2023-04-25 | Discharge: 2023-04-26 | Disposition: A | Discharge status: Home / Self Care | Payer: 59 | Attending: Emergency Medicine | Admitting: Emergency Medicine

## 2023-04-25 DIAGNOSIS — N390 Urinary tract infection, site not specified: Secondary | ICD-10-CM | POA: Diagnosis not present

## 2023-04-25 DIAGNOSIS — F840 Autistic disorder: Secondary | ICD-10-CM | POA: Diagnosis not present

## 2023-04-25 DIAGNOSIS — R4182 Altered mental status, unspecified: Secondary | ICD-10-CM | POA: Diagnosis present

## 2023-04-25 LAB — COMPREHENSIVE METABOLIC PANEL
ALT: 76 U/L — ABNORMAL HIGH (ref 0–44)
AST: 28 U/L (ref 15–41)
Albumin: 3.8 g/dL (ref 3.5–5.0)
Alkaline Phosphatase: 136 U/L — ABNORMAL HIGH (ref 38–126)
Anion gap: 8 (ref 5–15)
BUN: 11 mg/dL (ref 6–20)
CO2: 27 mmol/L (ref 22–32)
Calcium: 9.4 mg/dL (ref 8.9–10.3)
Chloride: 104 mmol/L (ref 98–111)
Creatinine, Ser: 0.55 mg/dL — ABNORMAL LOW (ref 0.61–1.24)
GFR, Estimated: >60 mL/min (ref >60)
Glucose, Bld: 95 mg/dL (ref 70–99)
Potassium: 3.9 mmol/L (ref 3.5–5.1)
Sodium: 139 mmol/L (ref 135–145)
Total Bilirubin: 0.6 mg/dL (ref <1.2)
Total Protein: 7.9 g/dL (ref 6.5–8.1)

## 2023-04-25 LAB — VALPROIC ACID LEVEL: Valproic Acid Lvl: 24 ug/mL — ABNORMAL LOW (ref 50.0–100.0)

## 2023-04-25 LAB — URINALYSIS, ROUTINE W REFLEX MICROSCOPIC
Bilirubin Urine: NEGATIVE
Glucose, UA: NEGATIVE mg/dL
Hgb urine dipstick: NEGATIVE
Ketones, ur: NEGATIVE mg/dL
Nitrite: NEGATIVE
Protein, ur: 100 mg/dL — AB
RBC / HPF: >50 RBC/hpf (ref 0–5)
Specific Gravity, Urine: 1.021 (ref 1.005–1.030)
WBC, UA: >50 WBC/hpf (ref 0–5)
pH: 7 (ref 5.0–8.0)

## 2023-04-25 LAB — CBC
HCT: 45.1 % (ref 39.0–52.0)
Hemoglobin: 14.9 g/dL (ref 13.0–17.0)
MCH: 27.8 pg (ref 26.0–34.0)
MCHC: 33 g/dL (ref 30.0–36.0)
MCV: 84.1 fL (ref 80.0–100.0)
Platelets: 417 10*3/uL — ABNORMAL HIGH (ref 150–400)
RBC: 5.36 MIL/uL (ref 4.22–5.81)
RDW: 13.3 % (ref 11.5–15.5)
WBC: 8.6 10*3/uL (ref 4.0–10.5)
nRBC: 0 % (ref 0.0–0.2)

## 2023-04-25 LAB — AMMONIA: Ammonia: 22 umol/L (ref 9–35)

## 2023-04-25 MED ORDER — AMOXICILLIN 400 MG/5ML PO SUSR
875.0000 mg | Freq: Once | ORAL | Status: AC
  Administered 2023-04-25: 875 mg
  Filled 2023-04-25: qty 15

## 2023-04-25 MED ORDER — AMOXICILLIN 875 MG PO TABS: 875.0000 mg | ORAL_TABLET | Freq: Two times a day (BID) | ORAL | 0 refills | Status: DC

## 2023-04-25 NOTE — Discharge Instructions (Signed)
The Depakote level was a little low but otherwise lab work was reassuring.  The urine does look like there is an infection.  A culture has been sent but the last infection was Enterococcus that was pansensitive.  We have started amoxicillin.
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
## 2023-04-25 NOTE — ED Triage Notes (Addendum)
BIB [facility] from [facility] for Altered mental status. Facility reports patient is A&O x 1 at baseline but is more combative. Antibiotics have recently been completed for UTI & Foley changed today.

## 2023-04-25 NOTE — ED Notes (Signed)
O2 monitor/BP cuff and EKG monitor reapplied multiple times. Pt continues to remove all monitors. Pt has bed alarm in place.

## 2023-04-30 LAB — URINE CULTURE: Culture: 70000 — AB

## 2023-05-01 ENCOUNTER — Telehealth (HOSPITAL_BASED_OUTPATIENT_CLINIC_OR_DEPARTMENT_OTHER): Payer: Self-pay | Admitting: *Deleted

## 2023-05-01 NOTE — Telephone Encounter (Signed)
Post ED Visit - Positive Culture Follow-up  Culture report reviewed by antimicrobial stewardship pharmacist: [facility] [x]  Daylene Posey Pharm.D. []  Celedonio Miyamoto, Pharm.D., BCPS AQ-ID []  Garvin Fila, Pharm.D., BCPS []  Georgina Pillion, Pharm.D., BCPS []  [place], [address].D., BCPS, AAHIVP []  Estella Husk, Pharm.D., BCPS, AAHIVP []  Lysle Pearl, PharmD, BCPS []  Phillips Climes, PharmD, BCPS []  Agapito Games, PharmD, BCPS []  Verlan Friends, PharmD []  Mervyn Gay, PharmD, BCPS []  Vinnie Level, PharmD  [facility] []  Len Childs, PharmD []  Greer Pickerel, PharmD []  Adalberto Cole, PharmD []  Perlie Gold, Rph []  Lonell Face) Jean Rosenthal, PharmD []  Earl Many, PharmD []  Junita Push, PharmD []  Dorna Leitz, PharmD []  Terrilee Files, PharmD []  Lynann Beaver, PharmD []  Keturah Barre, PharmD []  Loralee Pacas, PharmD []  Bernadene Person, PharmD   Positive urine culture Treated with amoxicillin, organism sensitive to the same and no further patient follow-up is required at this time.  [name] 05/01/2023, 9:43 AM

## 2023-05-16 ENCOUNTER — Encounter (HOSPITAL_COMMUNITY): Payer: Self-pay

## 2023-05-16 ENCOUNTER — Inpatient Hospital Stay (HOSPITAL_COMMUNITY)
Admission: EM | Admit: 2023-05-16 | Discharge: 2023-05-18 | DRG: 698 | Disposition: A | Discharge status: Skilled Nursing Facility | Payer: 59 | Source: Skilled Nursing Facility | Attending: Internal Medicine | Admitting: Internal Medicine

## 2023-05-16 DIAGNOSIS — R748 Abnormal levels of other serum enzymes: Secondary | ICD-10-CM

## 2023-05-16 DIAGNOSIS — Z87820 Personal history of traumatic brain injury: Secondary | ICD-10-CM | POA: Diagnosis present

## 2023-05-16 DIAGNOSIS — T83511A Infection and inflammatory reaction due to indwelling urethral catheter, initial encounter: Principal | ICD-10-CM | POA: Diagnosis present

## 2023-05-16 DIAGNOSIS — R531 Weakness: Secondary | ICD-10-CM

## 2023-05-16 DIAGNOSIS — F17210 Nicotine dependence, cigarettes, uncomplicated: Secondary | ICD-10-CM | POA: Diagnosis present

## 2023-05-16 DIAGNOSIS — Y846 Urinary catheterization as the cause of abnormal reaction of the patient, or of later complication, without mention of misadventure at the time of the procedure: Secondary | ICD-10-CM | POA: Diagnosis present

## 2023-05-16 DIAGNOSIS — E860 Dehydration: Secondary | ICD-10-CM | POA: Diagnosis present

## 2023-05-16 DIAGNOSIS — F250 Schizoaffective disorder, bipolar type: Secondary | ICD-10-CM | POA: Diagnosis present

## 2023-05-16 DIAGNOSIS — B964 Proteus (mirabilis) (morganii) as the cause of diseases classified elsewhere: Secondary | ICD-10-CM | POA: Diagnosis present

## 2023-05-16 DIAGNOSIS — S069XAA Unspecified intracranial injury with loss of consciousness status unknown, initial encounter: Secondary | ICD-10-CM | POA: Diagnosis present

## 2023-05-16 DIAGNOSIS — Z87440 Personal history of urinary (tract) infections: Secondary | ICD-10-CM

## 2023-05-16 DIAGNOSIS — R6510 Systemic inflammatory response syndrome (SIRS) of non-infectious origin without acute organ dysfunction: Secondary | ICD-10-CM

## 2023-05-16 DIAGNOSIS — Z91012 Allergy to eggs: Secondary | ICD-10-CM

## 2023-05-16 DIAGNOSIS — F840 Autistic disorder: Secondary | ICD-10-CM | POA: Diagnosis present

## 2023-05-16 DIAGNOSIS — Z79899 Other long term (current) drug therapy: Secondary | ICD-10-CM

## 2023-05-16 DIAGNOSIS — R7989 Other specified abnormal findings of blood chemistry: Secondary | ICD-10-CM | POA: Diagnosis present

## 2023-05-16 DIAGNOSIS — A419 Sepsis, unspecified organism: Secondary | ICD-10-CM | POA: Diagnosis present

## 2023-05-16 DIAGNOSIS — N390 Urinary tract infection, site not specified: Secondary | ICD-10-CM | POA: Diagnosis not present

## 2023-05-16 DIAGNOSIS — Z888 Allergy status to other drugs, medicaments and biological substances status: Secondary | ICD-10-CM

## 2023-05-16 DIAGNOSIS — Z881 Allergy status to other antibiotic agents status: Secondary | ICD-10-CM

## 2023-05-16 LAB — URINALYSIS, W/ REFLEX TO CULTURE (INFECTION SUSPECTED)
Bilirubin Urine: NEGATIVE
Glucose, UA: NEGATIVE mg/dL
Ketones, ur: 5 mg/dL — AB
Nitrite: NEGATIVE
Protein, ur: 100 mg/dL — AB
RBC / HPF: >50 RBC/hpf (ref 0–5)
Specific Gravity, Urine: 1.02 (ref 1.005–1.030)
WBC, UA: >50 WBC/hpf (ref 0–5)
pH: 7 (ref 5.0–8.0)

## 2023-05-16 LAB — I-STAT CG4 LACTIC ACID, ED: Lactic Acid, Venous: 1.2 mmol/L (ref 0.5–1.9)

## 2023-05-16 LAB — COMPREHENSIVE METABOLIC PANEL
ALT: 205 U/L — ABNORMAL HIGH (ref 0–44)
AST: 44 U/L — ABNORMAL HIGH (ref 15–41)
Albumin: 3.8 g/dL (ref 3.5–5.0)
Alkaline Phosphatase: 143 U/L — ABNORMAL HIGH (ref 38–126)
Anion gap: 13 (ref 5–15)
BUN: 13 mg/dL (ref 6–20)
CO2: 25 mmol/L (ref 22–32)
Calcium: 9.3 mg/dL (ref 8.9–10.3)
Chloride: 103 mmol/L (ref 98–111)
Creatinine, Ser: 0.71 mg/dL (ref 0.61–1.24)
GFR, Estimated: >60 mL/min (ref >60)
Glucose, Bld: 82 mg/dL (ref 70–99)
Potassium: 4 mmol/L (ref 3.5–5.1)
Sodium: 141 mmol/L (ref 135–145)
Total Bilirubin: 0.9 mg/dL (ref <1.2)
Total Protein: 7.7 g/dL (ref 6.5–8.1)

## 2023-05-16 LAB — CBC WITH DIFFERENTIAL/PLATELET
Abs Immature Granulocytes: 0.04 10*3/uL (ref 0.00–0.07)
Basophils Absolute: 0.1 10*3/uL (ref 0.0–0.1)
Basophils Relative: 1 %
Eosinophils Absolute: 0 10*3/uL (ref 0.0–0.5)
Eosinophils Relative: 0 %
HCT: 46.8 % (ref 39.0–52.0)
Hemoglobin: 15.3 g/dL (ref 13.0–17.0)
Immature Granulocytes: 0 %
Lymphocytes Relative: 17 %
Lymphs Abs: 2.1 10*3/uL (ref 0.7–4.0)
MCH: 28.3 pg (ref 26.0–34.0)
MCHC: 32.7 g/dL (ref 30.0–36.0)
MCV: 86.7 fL (ref 80.0–100.0)
Monocytes Absolute: 1.4 10*3/uL — ABNORMAL HIGH (ref 0.1–1.0)
Monocytes Relative: 11 %
Neutro Abs: 8.6 10*3/uL — ABNORMAL HIGH (ref 1.7–7.7)
Neutrophils Relative %: 71 %
Platelets: 332 10*3/uL (ref 150–400)
RBC: 5.4 MIL/uL (ref 4.22–5.81)
RDW: 13.5 % (ref 11.5–15.5)
WBC: 12.2 10*3/uL — ABNORMAL HIGH (ref 4.0–10.5)
nRBC: 0 % (ref 0.0–0.2)

## 2023-05-16 MED ORDER — SODIUM CHLORIDE 0.9 % IV BOLUS
1000.0000 mL | Freq: Once | INTRAVENOUS | Status: AC
  Administered 2023-05-16: 1000 mL via INTRAVENOUS

## 2023-05-16 MED ORDER — SODIUM CHLORIDE 0.9 % IV SOLN
1.0000 g | Freq: Once | INTRAVENOUS | Status: AC
  Administered 2023-05-16: 1 g via INTRAVENOUS
  Filled 2023-05-16: qty 10

## 2023-05-16 MED ORDER — SODIUM CHLORIDE 0.9 % IV SOLN
1.0000 g | INTRAVENOUS | Status: DC
  Administered 2023-05-17 – 2023-05-18 (×2): 1 g via INTRAVENOUS
  Filled 2023-05-16 (×3): qty 10

## 2023-05-16 NOTE — H&P (Signed)
History and Physical    Alexander Fritz ZOX:[id number] DOB: 12-11-1990 DOA: 05/16/2023  PCP: Patient, No Pcp Per  Patient coming from: SNF  Chief Complaint: Lethargy  HPI: Alexander Fritz is a 32 y.o. 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  Vital signs on arrival: Temperature 98.1 F, pulse 118, respiratory rate 18, blood pressure 110/80, and SpO2 100% on room air.  Labs notable for WBC count 12.2, AST 44, ALT 205, alk phos 143, T. bili 0.9, initial lactic acid 1.2 and repeat pending.  UA with negative nitrite, large amount of leukocytes, and microscopy showing >50 RBCs, >50 WBCs, and rare bacteria.  Urine culture pending.  Foley catheter was exchanged.  Patient was given ceftriaxone and 2 L normal saline.  TRH called to admit.  Patient is currently awake and alert, resting comfortably.  He is oriented to person and place but otherwise does not know why he is here and is not able to give any history.  Review of Systems:  Review of Systems  All other systems reviewed and are negative.   Past Medical History:  Diagnosis Date   Autism    Bipolar 1 disorder [facility])     Past Surgical History:  Procedure Laterality Date   CRANIOTOMY Left 12/24/2022   Procedure: HEMICRANIOTOMY FOR EVACUATION OF SUBDURAL HEMATOMA;  Surgeon: Bethann Goo, DO;  Location: [facility] OR;  Service: Neurosurgery;  Laterality: Left;     reports that he has been smoking cigarettes. He has a 2.5 pack-year smoking history. He has quit using smokeless tobacco. He reports that he does not drink alcohol and does not use drugs.  Allergies  Allergen Reactions   Cefaclor Other (See Comments)    Unknown.  Per group home MAR.   Egg-Derived Products    Lithium Palpitations and Rash    History reviewed. No pertinent family  history.  Prior to Admission medications   Medication Sig Start Date End Date Taking? Authorizing Provider  acetaminophen (TYLENOL) 500 MG tablet Place 2 tablets (1,000 mg total) into feeding tube every 8 (eight) hours as needed. 02/14/23   Maczis, Elmer Sow, PA-C  amoxicillin (AMOXIL) 875 MG tablet Place 1 tablet (875 mg total) into feeding tube 2 (two) times daily. 04/25/23   Benjiman Core, MD  ARIPiprazole (ABILIFY) 10 MG tablet Take 1 tablet (10 mg total) by mouth daily. 05/30/18   Laveda Abbe, NP  bethanechol (URECHOLINE) 25 MG tablet Place 1 tablet (25 mg total) into feeding tube 3 (three) times daily. 02/14/23   Maczis, Elmer Sow, PA-C  cetirizine (ZYRTEC) 10 MG tablet Take 10 mg by mouth daily. (0800)    [provider]  divalproex (DEPAKOTE) 500 MG DR tablet Take 1 tablet (500 mg total) by mouth 2 (two) times daily. 11/25/16   Charlynne Pander, MD  docusate (COLACE) 50 MG/5ML liquid Place 10 mLs (100 mg total) into feeding tube daily as needed for mild constipation. 02/14/23   Maczis, Elmer Sow, PA-C  docusate sodium (COLACE) 100 MG capsule Take 100 mg by mouth every morning.     [provider]  haloperidol decanoate (HALDOL DECANOATE) 100 MG/ML injection Inject 0.5 mLs (50 mg total) into the muscle every 30 (thirty) days. 12/23/16   Charm Rings, NP  hydrOXYzine (ATARAX) 50 MG tablet Place 1 tablet (50  mg total) into feeding tube every 8 (eight) hours as needed. 02/14/23   Maczis, Elmer Sow, PA-C  hydrOXYzine (ATARAX/VISTARIL) 25 MG tablet Take 1 tablet (25 mg total) by mouth 2 (two) times daily as needed for anxiety (agitation). 11/23/16   Charm Rings, NP  Nutritional Supplements (FEEDING SUPPLEMENT, PIVOT 1.5 CAL,) LIQD Place 1,000 mLs into feeding tube continuous. 02/14/23   Maczis, Elmer Sow, PA-C  oxyCODONE (OXY IR/ROXICODONE) 5 MG immediate release tablet Place 1 tablet (5 mg total) into feeding tube every 6 (six) hours as needed for breakthrough pain.  02/14/23   Maczis, Elmer Sow, PA-C  polycarbophil (FIBERCON) 625 MG tablet Place 1 tablet (625 mg total) into feeding tube as needed for diarrhea or loose stools. 02/14/23   Maczis, Elmer Sow, PA-C  polyethylene glycol (MIRALAX / GLYCOLAX) 17 g packet Place 17 g into feeding tube daily as needed (constipation). 02/14/23   Maczis, Elmer Sow, PA-C  risperiDONE (RISPERDAL) 2 MG tablet Place 1 tablet (2 mg total) into feeding tube daily. 02/15/23   Maczis, Elmer Sow, PA-C  trihexyphenidyl (ARTANE) 5 MG tablet Take 1 tablet (5 mg total) by mouth 2 (two) times daily with breakfast and lunch. (0800 & 2000) 11/23/16   Charm Rings, NP  trihexyphenidyl (ARTANE) 5 MG tablet Place 1 tablet (5 mg total) into feeding tube 2 (two) times daily. 02/14/23   Maczis, Elmer Sow, PA-C  valproic acid (DEPAKENE) 250 MG/5ML solution Place 10 mLs (500 mg total) into feeding tube every 8 (eight) hours. 02/14/23   Jacinto Halim, PA-C    Physical Exam: Vitals:   05/16/23 1505 05/16/23 1845 05/16/23 1856  BP: 110/80 107/84 108/79  Pulse: (!) 118 (!) 114 (!) 111  Resp: 18  18  Temp: 98.1 F (36.7 C)  98.1 F (36.7 C)  TempSrc: Oral  Oral  SpO2: 100% 100% 100%    Physical Exam Vitals reviewed.  Constitutional:      General: He is not in acute distress. HENT:     Head: Normocephalic and atraumatic.  Eyes:     Extraocular Movements: Extraocular movements intact.     Pupils: Pupils are equal, round, and reactive to light.  Cardiovascular:     Rate and Rhythm: Normal rate and regular rhythm.     Pulses: Normal pulses.  Pulmonary:     Effort: Pulmonary effort is normal. No respiratory distress.     Breath sounds: Normal breath sounds. No wheezing or rales.  Abdominal:     General: Bowel sounds are normal. There is no distension.     Palpations: Abdomen is soft.     Tenderness: There is no abdominal tenderness. There is no guarding.  Musculoskeletal:     Cervical back: Normal range of motion.     Right lower  leg: No edema.     Left lower leg: No edema.  Skin:    General: Skin is warm and dry.  Neurological:     General: No focal deficit present.     Mental Status: He is alert and oriented to person, place, and time.     Cranial Nerves: No cranial nerve deficit.     Sensory: No sensory deficit.     Motor: No weakness.     Labs on Admission: I have personally reviewed following labs and imaging studies  CBC: Recent Labs  Lab 05/16/23 1706  WBC 12.2*  NEUTROABS 8.6*  HGB 15.3  HCT 46.8  MCV 86.7  PLT 332   Basic  Metabolic Panel: Recent Labs  Lab 05/16/23 1706  NA 141  K 4.0  CL 103  CO2 25  GLUCOSE 82  BUN 13  CREATININE 0.71  CALCIUM 9.3   GFR: CrCl cannot be calculated (Unknown ideal weight.). Liver Function Tests: Recent Labs  Lab 05/16/23 1706  AST 44*  ALT 205*  ALKPHOS 143*  BILITOT 0.9  PROT 7.7  ALBUMIN 3.8   No results for input(s): "LIPASE", "AMYLASE" in the last 168 hours. No results for input(s): "AMMONIA" in the last 168 hours. Coagulation Profile: No results for input(s): "INR", "PROTIME" in the last 168 hours. Cardiac Enzymes: No results for input(s): "CKTOTAL", "CKMB", "CKMBINDEX", "TROPONINI" in the last 168 hours. BNP (last 3 results) No results for input(s): "PROBNP" in the last 8760 hours. HbA1C: No results for input(s): "HGBA1C" in the last 72 hours. CBG: No results for input(s): "GLUCAP" in the last 168 hours. Lipid Profile: No results for input(s): "CHOL", "HDL", "LDLCALC", "TRIG", "CHOLHDL", "LDLDIRECT" in the last 72 hours. Thyroid Function Tests: No results for input(s): "TSH", "T4TOTAL", "FREET4", "T3FREE", "THYROIDAB" in the last 72 hours. Anemia Panel: No results for input(s): "VITAMINB12", "FOLATE", "FERRITIN", "TIBC", "IRON", "RETICCTPCT" in the last 72 hours. Urine analysis:    Component Value Date/Time   COLORURINE YELLOW 05/16/2023 1708   APPEARANCEUR HAZY (A) 05/16/2023 1708   LABSPEC 1.020 05/16/2023 1708    PHURINE 7.0 05/16/2023 1708   GLUCOSEU NEGATIVE 05/16/2023 1708   HGBUR MODERATE (A) 05/16/2023 1708   BILIRUBINUR NEGATIVE 05/16/2023 1708   KETONESUR 5 (A) 05/16/2023 1708   PROTEINUR 100 (A) 05/16/2023 1708   NITRITE NEGATIVE 05/16/2023 1708   LEUKOCYTESUR LARGE (A) 05/16/2023 1708    Radiological Exams on Admission: No results found.  Assessment and Plan  Catheter associated urinary tract infection SIRS/ ?Sepsis Meets SIRS criteria with tachycardia and mild leukocytosis. UA with evidence of pyuria and bacteria.  No hypotension or lactic acidosis.  Tachycardia resolved after 2 L IV fluids.  Continue ceftriaxone and follow-up urine culture.  Blood cultures ordered.  Trend WBC count and lactate.  Foley catheter was exchanged in the ED.  Elevated liver enzymes Labs at present showing AST 44, ALT 205, alk phos 143, T. bili 0.9.  No abdominal pain or tenderness.  No nausea or vomiting.  On labs 3 weeks ago, ALT was 76 and alk phos 136.  AST and T. bili were normal at that time.  Acute hepatitis panel ordered and continue to monitor LFTs.  Generalized weakness/physical deconditioning In the setting of UTI.  Fall precautions.  Autism Bipolar disorder History of TBI Pharmacy med rec pending.  DVT prophylaxis: SCDs Code Status: Full code by default.  Patient does not have capacity for decision-making, no surrogate or prior directive available. Family Communication: No family available at this time. Level of care: Telemetry bed Admission status: It is my clinical opinion that referral for OBSERVATION is reasonable and necessary in this patient based on the above information provided. The aforementioned taken together are felt to place the patient at high risk for further clinical deterioration. However, it is anticipated that the patient may be medically stable for discharge from the hospital within 24 to 48 hours.  John Giovanni MD [facility]  If 7PM-7AM, please contact  night-coverage [email or web address]  05/16/2023, 9:44 PM

## 2023-05-16 NOTE — ED Notes (Signed)
ED TO INPATIENT HANDOFF REPORT  ED Nurse Name and Phone #: Berna Spare [phone]  S Name/Age/Gender Alexander Fritz 32 y.o. male Room/Bed: 020C/020C  Code Status   Code Status: Full Code  Home/SNF/Other Skilled nursing facility Patient oriented to: self Is this baseline? Yes   Triage Complete: Triage complete  Chief Complaint Catheter-associated urinary tract infection (HCC) [K02.542H, N39.0]  Triage Note Pt is coming in from [facility], they called Medic for increased lethargy and the staff feels like this is streaming from potential UTI, pt has an indwelling catheter with sediment. He just recently completed a round of Abx last week. Pt can answer some basic questions but staff reports he is usually more alert. Otherwise stable at this time.  Medic vitals   122/74 108hr 18rr Capnography 39 97%ra 118bgl     Allergies Allergies  Allergen Reactions   Cefaclor Other (See Comments)    Unknown.  Per group home MAR.   Egg-Derived Products    Lithium Palpitations and Rash    Level of Care/Admitting Diagnosis ED Disposition     ED Disposition  Admit   Condition  --   Comment  Hospital Area: [facility] [[id number]]  Level of Care: Telemetry Medical [104]  May place patient in observation at [facility] or [facility] if equivalent level of care is available:: Yes  Covid Evaluation: Asymptomatic - no recent exposure (last 10 days) testing not required  Diagnosis: Catheter-associated urinary tract infection [facility]) [[id number]]  Admitting Physician: John Giovanni [[id number]]  Attending Physician: John Giovanni [[id number]]          B Medical/Surgery History Past Medical History:  Diagnosis Date   Autism    Bipolar 1 disorder (HCC)    Past Surgical History:  Procedure Laterality Date   CRANIOTOMY Left 12/24/2022   Procedure: HEMICRANIOTOMY FOR EVACUATION OF SUBDURAL HEMATOMA;  Surgeon: Bethann Goo, DO;  Location: [facility] OR;  Service: Neurosurgery;   Laterality: Left;     A IV Location/Drains/Wounds Patient Lines/Drains/Airways Status     Active Line/Drains/Airways     Name Placement date Placement time Site Days   Peripheral IV 05/16/23 22 G 1.75" Anterior;Left Forearm 05/16/23  1701  Forearm  less than 1   Gastrostomy/Enterostomy Percutaneous endoscopic gastrostomy (PEG) LUQ 01/05/23  1000  LUQ  131   Urethral Catheter Krystal RN Straight-tip 14 Fr. 04/16/23  1540  Straight-tip  30            Intake/Output Last 24 hours  Intake/Output Summary (Last 24 hours) at 05/16/2023 2227 Last data filed at 05/16/2023 2222 Gross per 24 hour  Intake 1100 ml  Output --  Net 1100 ml    Labs/Imaging Results for orders placed or performed during the hospital encounter of 05/16/23 (from the past 48 hours)  Comprehensive metabolic panel     Status: Abnormal   Collection Time: 05/16/23  5:06 PM  Result Value Ref Range   Sodium 141 135 - 145 mmol/L   Potassium 4.0 3.5 - 5.1 mmol/L   Chloride 103 98 - 111 mmol/L   CO2 25 22 - 32 mmol/L   Glucose, Bld 82 70 - 99 mg/dL    Comment: Glucose reference range applies only to samples taken after fasting for at least 8 hours.   BUN 13 6 - 20 mg/dL   Creatinine, Ser 1.06 0.61 - 1.24 mg/dL   Calcium 9.3 8.9 - 26.9 mg/dL   Total Protein 7.7 6.5 - 8.1 g/dL   Albumin 3.8 3.5 -  5.0 g/dL   AST 44 (H) 15 - 41 U/L   ALT 205 (H) 0 - 44 U/L   Alkaline Phosphatase 143 (H) 38 - 126 U/L   Total Bilirubin 0.9 <1.2 mg/dL   GFR, Estimated >40 >98 mL/min    Comment: (NOTE) Calculated using the CKD-EPI Creatinine Equation (2021)    Anion gap 13 5 - 15    Comment: Performed at [facility], [address] [place], [place] [address]  CBC with Differential     Status: Abnormal   Collection Time: 05/16/23  5:06 PM  Result Value Ref Range   WBC 12.2 (H) 4.0 - 10.5 K/uL   RBC 5.40 4.22 - 5.81 MIL/uL   Hemoglobin 15.3 13.0 - 17.0 g/dL   HCT 78.2 95.6 - 21.3 %   MCV 86.7 80.0 - 100.0 fL   MCH 28.3  26.0 - 34.0 pg   MCHC 32.7 30.0 - 36.0 g/dL   RDW 08.6 57.8 - 46.9 %   Platelets 332 150 - 400 K/uL   nRBC 0.0 0.0 - 0.2 %   Neutrophils Relative % 71 %   Neutro Abs 8.6 (H) 1.7 - 7.7 K/uL   Lymphocytes Relative 17 %   Lymphs Abs 2.1 0.7 - 4.0 K/uL   Monocytes Relative 11 %   Monocytes Absolute 1.4 (H) 0.1 - 1.0 K/uL   Eosinophils Relative 0 %   Eosinophils Absolute 0.0 0.0 - 0.5 K/uL   Basophils Relative 1 %   Basophils Absolute 0.1 0.0 - 0.1 K/uL   Immature Granulocytes 0 %   Abs Immature Granulocytes 0.04 0.00 - 0.07 K/uL    Comment: Performed at [facility], [address], [place], [place] [address]  Urinalysis, w/ Reflex to Culture (Infection Suspected) -Urine, Clean Catch     Status: Abnormal   Collection Time: 05/16/23  5:08 PM  Result Value Ref Range   Specimen Source URINE, CLEAN CATCH    Color, Urine YELLOW YELLOW   APPearance HAZY (A) CLEAR   Specific Gravity, Urine 1.020 1.005 - 1.030   pH 7.0 5.0 - 8.0   Glucose, UA NEGATIVE NEGATIVE mg/dL   Hgb urine dipstick MODERATE (A) NEGATIVE   Bilirubin Urine NEGATIVE NEGATIVE   Ketones, ur 5 (A) NEGATIVE mg/dL   Protein, ur 841 (A) NEGATIVE mg/dL   Nitrite NEGATIVE NEGATIVE   Leukocytes,Ua LARGE (A) NEGATIVE   RBC / HPF >50 0 - 5 RBC/hpf   WBC, UA >50 0 - 5 WBC/hpf    Comment:        Reflex urine culture not performed if WBC <=10, OR if Squamous epithelial cells >5. If Squamous epithelial cells >5 suggest recollection.    Bacteria, UA RARE (A) NONE SEEN   Squamous Epithelial / HPF 0-5 0 - 5 /HPF   WBC Clumps PRESENT    Mucus PRESENT    Non Squamous Epithelial 0-5 (A) NONE SEEN    Comment: Performed at [facility], [address] [place], [place] [address]  I-Stat Lactic Acid, ED     Status: None   Collection Time: 05/16/23  5:16 PM  Result Value Ref Range   Lactic Acid, Venous 1.2 0.5 - 1.9 mmol/L   No results found.  Pending Labs Unresulted Labs (From admission, onward)     Start      Ordered   05/17/23 0500  Comprehensive metabolic panel  Tomorrow morning,   R        05/16/23  2220   05/17/23 0500  CBC  Tomorrow morning,   R        05/16/23 2220   05/16/23 2221  Culture, blood (Routine X 2) w Reflex to ID Panel  BLOOD CULTURE X 2,   R      05/16/23 2220   05/16/23 2220  Hepatitis panel, acute  Once,   R        05/16/23 2220   05/16/23 1708  Urine Culture  Once,   R        05/16/23 1708   05/16/23 1546  Urine Culture  Once,   URGENT       Question:  Indication  Answer:  Dysuria   05/16/23 1545            Vitals/Pain Today's Vitals   05/16/23 1505 05/16/23 1845 05/16/23 1856 05/16/23 2145  BP: 110/80 107/84 108/79 115/67  Pulse: (!) 118 (!) 114 (!) 111 90  Resp: 18  18 18   Temp: 98.1 F (36.7 C)  98.1 F (36.7 C) 97.6 F (36.4 C)  TempSrc: Oral  Oral Oral  SpO2: 100% 100% 100% 100%    Isolation Precautions No active isolations  Medications Medications  cefTRIAXone (ROCEPHIN) 1 g in sodium chloride 0.9 % 100 mL IVPB (has no administration in time range)  sodium chloride 0.9 % bolus 1,000 mL (0 mLs Intravenous Stopped 05/16/23 1846)  cefTRIAXone (ROCEPHIN) 1 g in sodium chloride 0.9 % 100 mL IVPB (0 g Intravenous Stopped 05/16/23 2222)  sodium chloride 0.9 % bolus 1,000 mL (1,000 mLs Intravenous New Bag/Given 05/16/23 2149)    Mobility non-ambulatory     Focused Assessments   R Recommendations: See Admitting Provider Note  Report given to:   Additional Notes: Hx of TBI. At SNF. Delayed responses but cooperative with request. Call or message with any questions.

## 2023-05-16 NOTE — ED Triage Notes (Signed)
Pt is coming in from [facility], they called Medic for increased lethargy and the staff feels like this is streaming from potential UTI, pt has an indwelling catheter with sediment. He just recently completed a round of Abx last week. Pt can answer some basic questions but staff reports he is usually more alert. Otherwise stable at this time.  Medic vitals   122/74 108hr 18rr Capnography 39 97%ra 118bgl

## 2023-05-16 NOTE — ED Provider Notes (Signed)
[place] EMERGENCY DEPARTMENT AT [facility] Provider Note  CSN: [id number] Arrival date & time: 05/16/23 1458  Chief Complaint(s) UTI  HPI Alexander Fritz is a 32 y.o. male history of autism, bipolar disorder, traumatic brain injury presenting to the emergency department for weakness.  The patient has had increased weakness lately.  He has had prior UTIs.  Recently had treatment with urine infection.  Patient reports he knows he is in the hospital but does not know exactly why.  He reports that he has some pain in his lower abdomen.  Denies other complaints.  History limited due to.  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 Authorizing Provider  acetaminophen (TYLENOL) 500 MG tablet Place 2 tablets (1,000 mg total) into feeding tube every 8 (eight) hours as needed. 02/14/23   Maczis, Elmer Sow, PA-C  amoxicillin (AMOXIL) 875 MG tablet Place 1 tablet (875 mg total) into feeding tube 2 (two) times daily. 04/25/23   Benjiman Core, MD  ARIPiprazole (ABILIFY) 10 MG tablet Take 1 tablet (10 mg total) by mouth daily. 05/30/18   Laveda Abbe, NP  bethanechol (URECHOLINE) 25 MG tablet Place 1 tablet (25 mg total) into feeding tube 3 (three)  times daily. 02/14/23   Maczis, Elmer Sow, PA-C  cetirizine (ZYRTEC) 10 MG tablet Take 10 mg by mouth daily. (0800)    [provider]  divalproex (DEPAKOTE) 500 MG DR tablet Take 1 tablet (500 mg total) by mouth 2 (two) times daily. 11/25/16   Charlynne Pander, MD  docusate (COLACE) 50 MG/5ML liquid Place 10 mLs (100 mg total) into feeding tube daily as needed for mild constipation. 02/14/23   Maczis, Elmer Sow, PA-C  docusate sodium (COLACE) 100 MG capsule Take 100 mg by mouth every morning.     [provider]  haloperidol decanoate (HALDOL DECANOATE) 100 MG/ML injection Inject 0.5 mLs (50 mg total) into the muscle every 30 (thirty) days. 12/23/16   Charm Rings, NP  hydrOXYzine (ATARAX) 50 MG tablet Place 1 tablet (50 mg total) into feeding tube every 8 (eight) hours as needed. 02/14/23   Maczis, Elmer Sow, PA-C  hydrOXYzine (ATARAX/VISTARIL) 25 MG tablet Take 1 tablet (25 mg total) by mouth 2 (two) times daily as needed for anxiety (agitation). 11/23/16   Charm Rings, NP  Nutritional Supplements (FEEDING SUPPLEMENT, PIVOT 1.5 CAL,) LIQD Place 1,000 mLs into feeding tube continuous. 02/14/23   Maczis, Elmer Sow, PA-C  oxyCODONE (OXY IR/ROXICODONE) 5 MG immediate release tablet Place 1 tablet (5 mg total) into feeding tube every 6 (six) hours as needed for breakthrough pain. 02/14/23   Maczis, Elmer Sow, PA-C  polycarbophil (FIBERCON) 625 MG tablet Place 1 tablet (625 mg total) into feeding tube as needed for diarrhea or loose stools. 02/14/23  Maczis, Elmer Sow, PA-C  polyethylene glycol (MIRALAX / GLYCOLAX) 17 g packet Place 17 g into feeding tube daily as needed (constipation). 02/14/23   Maczis, Elmer Sow, PA-C  risperiDONE (RISPERDAL) 2 MG tablet Place 1 tablet (2 mg total) into feeding tube daily. 02/15/23   Maczis, Elmer Sow, PA-C  trihexyphenidyl (ARTANE) 5 MG tablet Take 1 tablet (5 mg total) by mouth 2 (two) times daily with breakfast and lunch. (0800 & 2000) 11/23/16   Charm Rings, NP  trihexyphenidyl (ARTANE) 5 MG tablet Place 1 tablet (5 mg total) into feeding tube 2 (two) times daily. 02/14/23   Maczis, Elmer Sow, PA-C  valproic acid (DEPAKENE) 250 MG/5ML solution Place 10 mLs (500 mg total) into feeding tube every 8 (eight) hours. 02/14/23   Maczis, Elmer Sow, PA-C                                                                                                                                    Past Surgical History Past Surgical History:  Procedure Laterality Date   CRANIOTOMY Left 12/24/2022   Procedure: HEMICRANIOTOMY FOR EVACUATION OF SUBDURAL HEMATOMA;  Surgeon: Bethann Goo, DO;  Location: [facility] OR;  Service: Neurosurgery;  Laterality: Left;   Family History History reviewed. No pertinent family history.  Social History Social History   Tobacco Use   Smoking status: Every Day    Current packs/day: 0.50    Average packs/day: 0.5 packs/day for 5.0 years (2.5 ttl pk-yrs)    Types: Cigarettes   Smokeless tobacco: Former  Building services engineer status: Never Used  Substance Use Topics   Alcohol use: No   Drug use: No   Allergies Cefaclor, Egg-derived products, and Lithium  Review of Systems Review of Systems  All other systems reviewed and are negative.   Physical Exam Vital Signs  I have reviewed the triage vital signs BP 115/67   Pulse 90   Temp 97.6 F (36.4 C) (Oral)   Resp 18   SpO2 100%  Physical Exam Vitals and nursing note reviewed.  Constitutional:      General: He is not in acute distress.    Appearance: Normal appearance.  HENT:     Mouth/Throat:     Mouth: Mucous membranes are dry.  Eyes:     Conjunctiva/sclera: Conjunctivae normal.  Cardiovascular:     Rate and Rhythm: Normal rate and regular rhythm.  Pulmonary:     Effort: Pulmonary effort is normal. No respiratory distress.     Breath sounds: Normal breath sounds.  Abdominal:     General: Abdomen is flat.     Palpations: Abdomen is soft.     Tenderness: There  is abdominal tenderness (suprapubic).  Musculoskeletal:     Right lower leg: No edema.     Left lower leg: No edema.  Skin:    General: Skin is warm and dry.  Capillary Refill: Capillary refill takes less than 2 seconds.  Neurological:     Mental Status: He is alert. Mental status is at baseline.     Comments: Oriented to self, place.  Follows commands.  Moves all 4 extremities.  Psychiatric:        Mood and Affect: Mood normal.        Behavior: Behavior normal.     ED Results and Treatments Labs (all labs ordered are listed, but only abnormal results are displayed) Labs Reviewed  COMPREHENSIVE METABOLIC PANEL - Abnormal; Notable for the following components:      Result Value   AST 44 (*)    ALT 205 (*)    Alkaline Phosphatase 143 (*)    All other components within normal limits  CBC WITH DIFFERENTIAL/PLATELET - Abnormal; Notable for the following components:   WBC 12.2 (*)    Neutro Abs 8.6 (*)    Monocytes Absolute 1.4 (*)    All other components within normal limits  URINALYSIS, W/ REFLEX TO CULTURE (INFECTION SUSPECTED) - Abnormal; Notable for the following components:   APPearance HAZY (*)    Hgb urine dipstick MODERATE (*)    Ketones, ur 5 (*)    Protein, ur 100 (*)    Leukocytes,Ua LARGE (*)    Bacteria, UA RARE (*)    Non Squamous Epithelial 0-5 (*)    All other components within normal limits  URINE CULTURE  URINE CULTURE  CULTURE, BLOOD (ROUTINE X 2)  CULTURE, BLOOD (ROUTINE X 2)  COMPREHENSIVE METABOLIC PANEL  CBC  HEPATITIS PANEL, ACUTE  I-STAT CG4 LACTIC ACID, ED  I-STAT CG4 LACTIC ACID, ED                                                                                                                          Radiology No results found.  Pertinent labs & imaging results that were available during my care of the patient were reviewed by me and considered in my medical decision making (see MDM for details).  Medications Ordered in ED Medications   cefTRIAXone (ROCEPHIN) 1 g in sodium chloride 0.9 % 100 mL IVPB (has no administration in time range)  sodium chloride 0.9 % bolus 1,000 mL (0 mLs Intravenous Stopped 05/16/23 1846)  cefTRIAXone (ROCEPHIN) 1 g in sodium chloride 0.9 % 100 mL IVPB (0 g Intravenous Stopped 05/16/23 2222)  sodium chloride 0.9 % bolus 1,000 mL (1,000 mLs Intravenous New Bag/Given 05/16/23 2149)  Procedures Procedures  (including critical care time)  Medical Decision Making / ED Course   MDM:  32 year old male presenting to the emergency department with weakness.  Patient appears dehydrated,.   does have some mild lower abdominal tenderness on exam.  Suspect likely UTI related to indwelling urinary catheter.  Will exchange urinary catheter and obtain urinalysis.  He has some suprapubic tenderness which is likely related to this.  Will obtain other laboratory testing to evaluate for AKI to evaluate for dehydration.  No fever in the emergency department.  No other focal examination findings.  If urinalysis is unrevealing would consider further imaging given tenderness  Clinical Course as of 05/16/23 2224  Wed May 16, 2023  2223 Urinalysis does show signs of UTI.  He does have some mild persistent tachycardia.  He continues generally weak.  Given his altered mental status, and lethargy, we will go ahead and admit him for further monitoring.  Discussed with hospitalist Dr. Loney Loh who has admitted the patient. [WS]    Clinical Course User Index [WS] Lonell Grandchild, MD     Additional history obtained: -Additional history obtained from. -External records from outside source obtained and reviewed including: Chart review including previous notes, labs, imaging, consultation notes including prior ER visit    Lab Tests: -I ordered, reviewed, and interpreted labs.   The  pertinent results include:   Labs Reviewed  COMPREHENSIVE METABOLIC PANEL - Abnormal; Notable for the following components:      Result Value   AST 44 (*)    ALT 205 (*)    Alkaline Phosphatase 143 (*)    All other components within normal limits  CBC WITH DIFFERENTIAL/PLATELET - Abnormal; Notable for the following components:   WBC 12.2 (*)    Neutro Abs 8.6 (*)    Monocytes Absolute 1.4 (*)    All other components within normal limits  URINALYSIS, W/ REFLEX TO CULTURE (INFECTION SUSPECTED) - Abnormal; Notable for the following components:   APPearance HAZY (*)    Hgb urine dipstick MODERATE (*)    Ketones, ur 5 (*)    Protein, ur 100 (*)    Leukocytes,Ua LARGE (*)    Bacteria, UA RARE (*)    Non Squamous Epithelial 0-5 (*)    All other components within normal limits  URINE CULTURE  URINE CULTURE  CULTURE, BLOOD (ROUTINE X 2)  CULTURE, BLOOD (ROUTINE X 2)  COMPREHENSIVE METABOLIC PANEL  CBC  HEPATITIS PANEL, ACUTE  I-STAT CG4 LACTIC ACID, ED  I-STAT CG4 LACTIC ACID, ED    Notable for leukocytosis, UTI   Medicines ordered and prescription drug management: Meds ordered this encounter  Medications   sodium chloride 0.9 % bolus 1,000 mL   cefTRIAXone (ROCEPHIN) 1 g in sodium chloride 0.9 % 100 mL IVPB    Antibiotic Indication::   UTI   sodium chloride 0.9 % bolus 1,000 mL   cefTRIAXone (ROCEPHIN) 1 g in sodium chloride 0.9 % 100 mL IVPB    Antibiotic Indication::   UTI    -I have reviewed the patients home medicines and have made adjustments as needed   Consultations Obtained: I requested consultation with the hospitalist,  and discussed lab and imaging findings as well as pertinent plan - they recommend: admission   Cardiac Monitoring: The patient was maintained on a cardiac monitor.  I personally viewed and interpreted the cardiac monitored which showed an underlying rhythm of: NSR  Reevaluation: After the interventions noted above, I reevaluated the patient and  found that their symptoms have stayed the same  Co morbidities that complicate the patient evaluation  Past Medical History:  Diagnosis Date   Autism    Bipolar 1 disorder (HCC)       Dispostion: Disposition decision including need for hospitalization was considered, and patient admitted to the hospital.    Final Clinical Impression(s) / ED Diagnoses Final diagnoses:  Urinary tract infection associated with indwelling urethral catheter, initial encounter [facility])     This chart was dictated using voice recognition software.  Despite best efforts to proofread,  errors can occur which can change the documentation meaning.    Lonell Grandchild, MD 05/16/23 2224

## 2023-05-16 NOTE — ED Notes (Signed)
1st lactic was normal 2nd can be discontinued

## 2023-05-17 ENCOUNTER — Other Ambulatory Visit: Payer: Self-pay

## 2023-05-17 DIAGNOSIS — Z91012 Allergy to eggs: Secondary | ICD-10-CM | POA: Diagnosis not present

## 2023-05-17 DIAGNOSIS — Z87820 Personal history of traumatic brain injury: Secondary | ICD-10-CM | POA: Diagnosis not present

## 2023-05-17 DIAGNOSIS — F250 Schizoaffective disorder, bipolar type: Secondary | ICD-10-CM | POA: Diagnosis present

## 2023-05-17 DIAGNOSIS — F17210 Nicotine dependence, cigarettes, uncomplicated: Secondary | ICD-10-CM | POA: Diagnosis present

## 2023-05-17 DIAGNOSIS — R7989 Other specified abnormal findings of blood chemistry: Secondary | ICD-10-CM | POA: Diagnosis present

## 2023-05-17 DIAGNOSIS — Y846 Urinary catheterization as the cause of abnormal reaction of the patient, or of later complication, without mention of misadventure at the time of the procedure: Secondary | ICD-10-CM | POA: Diagnosis present

## 2023-05-17 DIAGNOSIS — B964 Proteus (mirabilis) (morganii) as the cause of diseases classified elsewhere: Secondary | ICD-10-CM | POA: Diagnosis present

## 2023-05-17 DIAGNOSIS — N390 Urinary tract infection, site not specified: Secondary | ICD-10-CM | POA: Diagnosis present

## 2023-05-17 DIAGNOSIS — A4150 Gram-negative sepsis, unspecified: Secondary | ICD-10-CM | POA: Diagnosis not present

## 2023-05-17 DIAGNOSIS — E860 Dehydration: Secondary | ICD-10-CM | POA: Diagnosis present

## 2023-05-17 DIAGNOSIS — T83511A Infection and inflammatory reaction due to indwelling urethral catheter, initial encounter: Secondary | ICD-10-CM | POA: Diagnosis present

## 2023-05-17 DIAGNOSIS — Z87440 Personal history of urinary (tract) infections: Secondary | ICD-10-CM | POA: Diagnosis not present

## 2023-05-17 DIAGNOSIS — F840 Autistic disorder: Secondary | ICD-10-CM | POA: Diagnosis present

## 2023-05-17 DIAGNOSIS — Z888 Allergy status to other drugs, medicaments and biological substances status: Secondary | ICD-10-CM | POA: Diagnosis not present

## 2023-05-17 DIAGNOSIS — R748 Abnormal levels of other serum enzymes: Secondary | ICD-10-CM | POA: Diagnosis present

## 2023-05-17 DIAGNOSIS — Z881 Allergy status to other antibiotic agents status: Secondary | ICD-10-CM | POA: Diagnosis not present

## 2023-05-17 DIAGNOSIS — A419 Sepsis, unspecified organism: Secondary | ICD-10-CM | POA: Diagnosis present

## 2023-05-17 DIAGNOSIS — Z79899 Other long term (current) drug therapy: Secondary | ICD-10-CM | POA: Diagnosis not present

## 2023-05-17 LAB — COMPREHENSIVE METABOLIC PANEL
ALT: 139 U/L — ABNORMAL HIGH (ref 0–44)
AST: 27 U/L (ref 15–41)
Albumin: 3 g/dL — ABNORMAL LOW (ref 3.5–5.0)
Alkaline Phosphatase: 109 U/L (ref 38–126)
Anion gap: 11 (ref 5–15)
BUN: 11 mg/dL (ref 6–20)
CO2: 23 mmol/L (ref 22–32)
Calcium: 8.9 mg/dL (ref 8.9–10.3)
Chloride: 105 mmol/L (ref 98–111)
Creatinine, Ser: 0.62 mg/dL (ref 0.61–1.24)
GFR, Estimated: >60 mL/min (ref >60)
Glucose, Bld: 76 mg/dL (ref 70–99)
Potassium: 3.8 mmol/L (ref 3.5–5.1)
Sodium: 139 mmol/L (ref 135–145)
Total Bilirubin: 1 mg/dL (ref <1.2)
Total Protein: 6.3 g/dL — ABNORMAL LOW (ref 6.5–8.1)

## 2023-05-17 LAB — HEPATITIS PANEL, ACUTE
HCV Ab: NONREACTIVE
Hep A IgM: NONREACTIVE
Hep B C IgM: NONREACTIVE
Hepatitis B Surface Ag: NONREACTIVE

## 2023-05-17 LAB — CBC
HCT: 38 % — ABNORMAL LOW (ref 39.0–52.0)
Hemoglobin: 12.5 g/dL — ABNORMAL LOW (ref 13.0–17.0)
MCH: 28.7 pg (ref 26.0–34.0)
MCHC: 32.9 g/dL (ref 30.0–36.0)
MCV: 87.2 fL (ref 80.0–100.0)
Platelets: 306 10*3/uL (ref 150–400)
RBC: 4.36 MIL/uL (ref 4.22–5.81)
RDW: 13.4 % (ref 11.5–15.5)
WBC: 9.6 10*3/uL (ref 4.0–10.5)
nRBC: 0 % (ref 0.0–0.2)

## 2023-05-17 MED ORDER — CHLORHEXIDINE GLUCONATE CLOTH 2 % EX PADS
6.0000 | MEDICATED_PAD | Freq: Every day | CUTANEOUS | Status: DC
Start: 2023-05-17 — End: 2023-05-18
  Administered 2023-05-17 – 2023-05-18 (×2): 6 via TOPICAL

## 2023-05-17 NOTE — TOC Progression Note (Signed)
Transition of Care [facility]) - Progression Note    Patient Details  Name: Alexander Fritz MRN: [id number] Date of Birth: 08-Nov-1990  Transition of [facility]) CM/SW Contact  Mal Asher A [place], [place] Phone Number: 05/17/2023, 2:00 PM  Clinical Narrative:      CSW was contacted by Wille Celeste at [facility], who stated that pt was from [facility] and can return when medically stable. CSW not yet heard back from pt's legal guardian.    TOC will continue to follow.       Expected Discharge Plan and Services                                               Social Determinants of Health (SDOH) Interventions SDOH Screenings   Food Insecurity: No Food Insecurity (05/16/2023)  Housing: Low Risk  (05/17/2023)  Transportation Needs: No Transportation Needs (05/16/2023)  Utilities: Not At Risk (05/16/2023)  Tobacco Use: High Risk (05/16/2023)    Readmission Risk Interventions     No data to display

## 2023-05-17 NOTE — Plan of Care (Signed)

## 2023-05-17 NOTE — Progress Notes (Signed)
  Progress Note   Patient: Alexander Fritz ZOX:[id number] DOB: August 12, 1990 DOA: 05/16/2023     0 DOS: the patient was seen and examined on 05/17/2023    Assessment and Plan: Sepsis due to UTI associated with catheter  - IV ceftriaxone 1 g daily  - F/u urine cx   Elevated LFTs  - LFT's are downtrending, continue to monitor   Generalized weakness - PT/OT ordered; recommendations forthcoming       Subjective: Pt seen and examined at the bedside. WBC has downtrended with IV antibx's (12 -->9).  PT/OT ordered for debility/weakness.   Physical Exam: Vitals:   05/16/23 2316 05/16/23 2337 05/17/23 0409 05/17/23 0718  BP: 106/78  115/76 114/77  Pulse: (!) 105  (!) 108 (!) 101  Resp: 17  18 18   Temp: 97.7 F (36.5 C)  98.7 F (37.1 C) (!) 97.4 F (36.3 C)  TempSrc: Oral  Oral Oral  SpO2: 100%  99% 99%  Weight:  68.2 kg    Height:  5\' 8"  (1.727 m)     Physical Exam HENT:     Head: Normocephalic.     Mouth/Throat:     Mouth: Mucous membranes are moist.  Cardiovascular:     Rate and Rhythm: Normal rate.  Pulmonary:     Effort: Pulmonary effort is normal.  Abdominal:     Palpations: Abdomen is soft.  Musculoskeletal:     Cervical back: Neck supple.  Skin:    General: Skin is warm.  Neurological:     Mental Status: He is alert. Mental status is at baseline.  Psychiatric:        Mood and Affect: Mood normal.     Data Reviewed:  There are no new results to review at this time.     Disposition: Status is: Inpatient Remains inpatient appropriate because: he requires IV antibx and PT/OT evaluation  Planned Discharge Destination: Skilled nursing facility    Time spent: 35 minutes  Author: Baron Hamper , MD 05/17/2023 11:26 AM  For on call review [email or web address].

## 2023-05-17 NOTE — Progress Notes (Signed)
Pt received to 2 west room 1 from ED.  Pt oriented to room and call bell.  See admission data base, assessment, vs's.  Pt ST 110 on the monitor.

## 2023-05-17 NOTE — TOC Progression Note (Signed)
Transition of Care [facility]) - Progression Note    Patient Details  Name: Alexander Fritz MRN: [id number] Date of Birth: [birth year]  [facility]) CM/SW Contact  Alexander Fritz, [place] Phone Number: 05/17/2023, 10:15 AM  Clinical Narrative:     CSW contacted pt's legal guardian, Alexander Fritz, to complete assessment. There was no answer, CSW left VM with contact number to reach back out to CSW. CSW to follow back up if does not here back in timely manner.    TOC will continue to follow.        Expected Discharge Plan and Services                                               Social Determinants of Health (SDOH) Interventions SDOH Screenings   Food Insecurity: No Food Insecurity (05/16/2023)  Housing: Low Risk  (05/17/2023)  Transportation Needs: No Transportation Needs (05/16/2023)  Utilities: Not At Risk (05/16/2023)  Tobacco Use: High Risk (05/16/2023)    Readmission Risk Interventions     No data to display

## 2023-05-17 NOTE — TOC Initial Note (Signed)
Transition of Care [facility]) - Initial/Assessment Note    Patient Details  Name: Alexander Fritz MRN: [id number] Date of Birth: July 09, 1990  [facility] Care [facility]) CM/SW Contact:    Arsalan Brisbin A [place], Theresia Majors Phone Number: 05/17/2023, 3:38 PM  Clinical Narrative:                 CSW completed assessment with pt's legal guardian [name]. She stated that is from [facility] and has been there for about one year. CSW to assist with transportation at discharge. EDD tomorrow.    TOC will continue to follow.   Expected Discharge Plan: Long Term Nursing Home Barriers to Discharge: Continued Medical Work up   Patient Goals and CMS Choice Patient states their goals for this hospitalization and ongoing recovery are:: Get better          Expected Discharge Plan and Services       Living arrangements for the past 2 months: Skilled Nursing Facility                                      Prior Living Arrangements/Services Living arrangements for the past 2 months: Skilled Nursing Facility Lives with:: Facility Resident                   Activities of Daily Living   ADL Screening (condition at time of admission) Independently performs ADLs?: No Does the patient have a NEW difficulty with bathing/dressing/toileting/self-feeding that is expected to last >3 days?: No Does the patient have a NEW difficulty with getting in/out of bed, walking, or climbing stairs that is expected to last >3 days?: No Does the patient have a NEW difficulty with communication that is expected to last >3 days?: No Is the patient deaf or have difficulty hearing?: No Does the patient have difficulty seeing, even when wearing glasses/contacts?: No Does the patient have difficulty concentrating, remembering, or making decisions?: Yes  Permission Sought/Granted                  Emotional Assessment   Attitude/Demeanor/Rapport: Unable to Assess (normal) Affect (typically observed): Unable to  Assess Orientation: : Oriented to Self Alcohol / Substance Use: Tobacco Use Psych Involvement: No (comment)  Admission diagnosis:  Catheter-associated urinary tract infection (HCC) [N82.956O, N39.0] Urinary tract infection associated with indwelling urethral catheter, initial encounter (HCC) [Z30.865H, N39.0] Patient Active Problem List   Diagnosis Date Noted   Catheter-associated urinary tract infection (HCC) 05/16/2023   SIRS (systemic inflammatory response syndrome) (HCC) 05/16/2023   Elevated liver enzymes 05/16/2023   Generalized weakness 05/16/2023   TBI (traumatic brain injury) (HCC) 12/24/2022   Suicidal ideation    Visual hallucination    Auditory hallucination 01/31/2017   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 [place] [address] Phone: [phone] Fax: [phone]  [facility] - [place], [place] - [address] [place] [address] Phone: [phone] Fax: [phone]     Social Drivers of Health (SDOH) Social History: SDOH Screenings   Food Insecurity: No Food Insecurity (05/16/2023)  Housing: Low Risk  (05/17/2023)  Transportation Needs: No Transportation Needs (05/16/2023)  Utilities: Not At Risk (05/16/2023)  Tobacco Use:  High Risk (05/16/2023)   SDOH Interventions:     Readmission Risk Interventions     No data to display

## 2023-05-18 DIAGNOSIS — A4150 Gram-negative sepsis, unspecified: Secondary | ICD-10-CM

## 2023-05-18 DIAGNOSIS — N390 Urinary tract infection, site not specified: Secondary | ICD-10-CM | POA: Diagnosis not present

## 2023-05-18 LAB — URINE CULTURE: Culture: 70000 — AB

## 2023-05-18 NOTE — Progress Notes (Signed)
OT Cancellation Note  Patient Details Name: Alexander Fritz MRN: [id number] DOB: 05-09-1991   Cancelled Treatment:    Reason Eval/Treat Not Completed:  (Pt with discharge summary on chart for return to [facility], will defer OT eval to SNF.)  Evern Bio 05/18/2023, 3:27 PM Berna Spare, OTR/L Acute Rehabilitation Services Office: [phone]

## 2023-05-18 NOTE — Plan of Care (Signed)

## 2023-05-18 NOTE — TOC Transition Note (Signed)
Transition of Care [facility]) - Discharge Note   Patient Details  Name: Alexander Fritz MRN: [id number] Date of Birth: October 21, 1990  Transition of Care [facility]) CM/SW Contact:  Breuna Loveall A [place], LCSWA Phone Number: 05/18/2023, 12:59 PM   Clinical Narrative:     Patient will DC to: [name]  Anticipated DC date: 05/18/23  Family notified: [name]  Transport by: Sharin Mons      Per MD patient ready for DC to [facility]. RN, patient, patient's family, and facility notified of DC. Discharge Summary and FL2 sent to facility. RN to call report prior to discharge [phone], [phone]). DC packet on chart. Ambulance transport requested for patient.     CSW will sign off for now as social work intervention is no longer needed. Please consult [place] again if new needs arise.   Final next level of care: Long Term Nursing Home Barriers to Discharge: Barriers Resolved   Patient Goals and CMS Choice Patient states their goals for this hospitalization and ongoing recovery are:: Get better          Discharge Placement              Patient chooses bed at:  [facility]) Patient to be transferred to facility by: [facility] Name of family member notified: [name] Patient and family notified of of transfer: 05/18/23  Discharge Plan and Services Additional resources added to the After Visit Summary for                                       Social Drivers of Health (SDOH) Interventions SDOH Screenings   Food Insecurity: No Food Insecurity (05/16/2023)  Housing: Low Risk  (05/17/2023)  Transportation Needs: No Transportation Needs (05/16/2023)  Utilities: Not At Risk (05/16/2023)  Tobacco Use: High Risk (05/16/2023)     Readmission Risk Interventions     No data to display
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
## 2023-05-20 ENCOUNTER — Encounter (HOSPITAL_COMMUNITY): Payer: Self-pay | Admitting: Emergency Medicine

## 2023-05-20 ENCOUNTER — Emergency Department (HOSPITAL_COMMUNITY)
Admission: EM | Admit: 2023-05-20 | Discharge: 2023-05-21 | Disposition: A | Discharge status: Home / Self Care | Payer: 59 | Attending: Emergency Medicine | Admitting: Emergency Medicine

## 2023-05-20 ENCOUNTER — Emergency Department (HOSPITAL_COMMUNITY): Payer: 59

## 2023-05-20 ENCOUNTER — Other Ambulatory Visit: Payer: Self-pay

## 2023-05-20 DIAGNOSIS — R1084 Generalized abdominal pain: Secondary | ICD-10-CM | POA: Diagnosis not present

## 2023-05-20 DIAGNOSIS — R109 Unspecified abdominal pain: Secondary | ICD-10-CM | POA: Diagnosis present

## 2023-05-20 LAB — CBC WITH DIFFERENTIAL/PLATELET
Abs Immature Granulocytes: 0.02 10*3/uL (ref 0.00–0.07)
Basophils Absolute: 0 10*3/uL (ref 0.0–0.1)
Basophils Relative: 1 %
Eosinophils Absolute: 0.2 10*3/uL (ref 0.0–0.5)
Eosinophils Relative: 3 %
HCT: 42.1 % (ref 39.0–52.0)
Hemoglobin: 13.9 g/dL (ref 13.0–17.0)
Immature Granulocytes: 0 %
Lymphocytes Relative: 36 %
Lymphs Abs: 2.5 10*3/uL (ref 0.7–4.0)
MCH: 28.4 pg (ref 26.0–34.0)
MCHC: 33 g/dL (ref 30.0–36.0)
MCV: 86.1 fL (ref 80.0–100.0)
Monocytes Absolute: 0.5 10*3/uL (ref 0.1–1.0)
Monocytes Relative: 7 %
Neutro Abs: 3.8 10*3/uL (ref 1.7–7.7)
Neutrophils Relative %: 53 %
Platelets: 368 10*3/uL (ref 150–400)
RBC: 4.89 MIL/uL (ref 4.22–5.81)
RDW: 13.2 % (ref 11.5–15.5)
WBC: 7.1 10*3/uL (ref 4.0–10.5)
nRBC: 0 % (ref 0.0–0.2)

## 2023-05-20 NOTE — ED Provider Notes (Signed)
[facility]-EMERGENCY DEPT [facility] Emergency Department Provider Note MRN:  [id number]  Arrival date & time: 05/21/23     Chief Complaint   Abdominal Pain   History of Present Illness   Alexander Fritz is a 32 y.o. year-old male presents to the ED with chief complaint of abdominal pain.  BIB EMS from [facility].  Hx of TBI and autism.  Recently treated for catheter associated UTI that was positive for Proteus Mirabilis.  Patient reports that he hasn't had a BM in several days.  History provided by patient.   Review of Systems  Pertinent positive and negative review of systems noted in HPI.    Physical Exam   Vitals:   05/20/23 2228 05/21/23 0233  BP: 100/70 92/61  Pulse: 82 (!) 57  Resp:  18  Temp: 98.6 F (37 C) 97.7 F (36.5 C)  SpO2: 98% 100%    CONSTITUTIONAL:  non toxic-appearing, NAD NEURO:  Alert and oriented x 3, CN 3-12 grossly intact EYES:  eyes equal and reactive ENT/NECK:  Supple, no stridor  CARDIO:  normal rate, regular rhythm, appears well-perfused  PULM:  No respiratory distress, CTAB GI/GU:  non-distended, no focal abdominal tenderness, g-tube in LUQ CDI MSK/SPINE:  No gross deformities, no edema, moves all extremities  SKIN:  no rash, atraumatic   *Additional and/or pertinent findings included in MDM below  Diagnostic and Interventional Summary    EKG Interpretation Date/Time:    Ventricular Rate:    PR Interval:    QRS Duration:    QT Interval:    QTC Calculation:   R Axis:      Text Interpretation:         Labs Reviewed  URINALYSIS, ROUTINE W REFLEX MICROSCOPIC - Abnormal; Notable for the following components:      Result Value   Color, Urine AMBER (*)    APPearance HAZY (*)    Ketones, ur 5 (*)    Protein, ur 30 (*)    Leukocytes,Ua TRACE (*)    All other components within normal limits  COMPREHENSIVE METABOLIC PANEL - Abnormal; Notable for the following components:   Creatinine, Ser 0.60 (*)    Albumin 3.4 (*)    ALT 59  (*)    All other components within normal limits  URINE CULTURE  CBC WITH DIFFERENTIAL/PLATELET    CT ABDOMEN PELVIS W CONTRAST  Final Result    DG Abdomen 1 View  Final Result      Medications  iohexol (OMNIPAQUE) 350 MG/ML injection 75 mL (75 mLs Intravenous Contrast Given 05/21/23 0215)     Procedures  /  Critical Care Procedures  ED Course and Medical Decision Making  I have reviewed the triage vital signs, the nursing notes, and pertinent available records from the EMR.  Social Determinants Affecting Complexity of Care: Patient has no clinically significant social determinants affecting this chief complaint..   ED Course:    Medical Decision Making Patient here with complaint of abdominal pain.  Facility [name]), called EMS for transport.  Patient had recent admission for catheter associated UTI.  Had an episode of abdominal pain earlier, which has now resolved.  He has a soft non-tender abdomen and is in no distress.  Will check labs, urine, and imaging.  Labs and imaging are reassuring.  The urine looks improved and doesn't appear acutely infected.  I reviewed this with Dr. Madilyn Hook, who agrees and recommends sending for culture, but holding abx treatment at this point.  CT notes cystitis, but will await urine culture per Dr. Madilyn Hook' recommendations.  Patient isn't febrile.  Looks well.  Non-tender.  No leukocytosis.  Might be chronic inflammation seen on CT.  Amount and/or Complexity of Data Reviewed Labs: ordered. Radiology: ordered.  Risk Prescription drug management.         Consultants: No consultations were needed in caring for this patient.   Treatment and Plan: I considered admission due to patient's initial presentation, but after considering the examination and diagnostic results, patient will not require admission and can be discharged with outpatient follow-up.    Final Clinical Impressions(s) / ED Diagnoses     ICD-10-CM   1.  Generalized abdominal pain  R10.84       ED Discharge Orders     None         Discharge Instructions Discussed with and Provided to Patient:     Discharge Instructions      Your tests looked good.  Your urine looked pretty good and doesn't seem infected anymore, but we've sent it to the lab for urine culture.       Roxy Horseman, PA-C 05/21/23 1610    Tilden Fossa, MD 05/21/23 [id number]

## 2023-05-20 NOTE — ED Triage Notes (Signed)
Patient BIBA from [facility] c/o of epigastric, abdominal pain. Did not wince with tenderness. Hx: Communication deficits with autism and TBI. Recently being treated for UTI on antibiotics. Has a foley in place. Hx and foley is baseline per patient. States he has not had a bowel movement in several days. VSS.   140/84 HR-78 RR-20 CBG-113 O2-98% Room air

## 2023-05-21 ENCOUNTER — Emergency Department (HOSPITAL_COMMUNITY): Payer: 59

## 2023-05-21 DIAGNOSIS — R1084 Generalized abdominal pain: Secondary | ICD-10-CM | POA: Diagnosis not present

## 2023-05-21 LAB — URINALYSIS, ROUTINE W REFLEX MICROSCOPIC
Bacteria, UA: NONE SEEN
Bilirubin Urine: NEGATIVE
Glucose, UA: NEGATIVE mg/dL
Hgb urine dipstick: NEGATIVE
Ketones, ur: 5 mg/dL — AB
Nitrite: NEGATIVE
Protein, ur: 30 mg/dL — AB
Specific Gravity, Urine: 1.02 (ref 1.005–1.030)
pH: 7 (ref 5.0–8.0)

## 2023-05-21 LAB — COMPREHENSIVE METABOLIC PANEL
ALT: 59 U/L — ABNORMAL HIGH (ref 0–44)
AST: 17 U/L (ref 15–41)
Albumin: 3.4 g/dL — ABNORMAL LOW (ref 3.5–5.0)
Alkaline Phosphatase: 111 U/L (ref 38–126)
Anion gap: 9 (ref 5–15)
BUN: 7 mg/dL (ref 6–20)
CO2: 25 mmol/L (ref 22–32)
Calcium: 9 mg/dL (ref 8.9–10.3)
Chloride: 105 mmol/L (ref 98–111)
Creatinine, Ser: 0.6 mg/dL — ABNORMAL LOW (ref 0.61–1.24)
GFR, Estimated: >60 mL/min (ref >60)
Glucose, Bld: 82 mg/dL (ref 70–99)
Potassium: 4 mmol/L (ref 3.5–5.1)
Sodium: 139 mmol/L (ref 135–145)
Total Bilirubin: 0.4 mg/dL (ref <1.2)
Total Protein: 6.7 g/dL (ref 6.5–8.1)

## 2023-05-21 MED ORDER — IOHEXOL 350 MG/ML SOLN
75.0000 mL | Freq: Once | INTRAVENOUS | Status: AC | PRN
  Administered 2023-05-21: 75 mL via INTRAVENOUS

## 2023-05-21 NOTE — Discharge Instructions (Signed)
Your tests looked good.  Your urine looked pretty good and doesn't seem infected anymore, but we've sent it to the lab for urine culture.

## 2023-05-21 NOTE — ED Notes (Signed)
Patient's facility is [facility]

## 2023-05-22 LAB — CULTURE, BLOOD (ROUTINE X 2)
Culture: NO GROWTH
Culture: NO GROWTH

## 2023-05-24 LAB — URINE CULTURE: Culture: 40000 — AB

## 2023-05-25 ENCOUNTER — Telehealth (HOSPITAL_BASED_OUTPATIENT_CLINIC_OR_DEPARTMENT_OTHER): Payer: Self-pay | Admitting: *Deleted

## 2023-05-25 NOTE — Telephone Encounter (Signed)
 Post ED Visit - Positive Culture Follow-up  Culture report reviewed by antimicrobial stewardship pharmacist: [facility] Team [x]  [place], [place].D. []  Venetia Gully, Pharm.D., BCPS AQ-ID []  Garrel Crews, Pharm.D., BCPS []  Almarie Lunger, Pharm.D., BCPS []  [place], [place].D., BCPS, AAHIVP []  Rosaline Bihari, Pharm.D., BCPS, AAHIVP []  Vernell Meier, PharmD, BCPS []  Latanya Hint, PharmD, BCPS []  Donald Medley, PharmD, BCPS []  Rocky Bold, PharmD []  Dorothyann Alert, PharmD, BCPS []  Morene Babe, PharmD  [facility] []  Rosaline Edison, PharmD []  Romona Bliss, PharmD []  Dolphus Roller, PharmD []  Veva Seip, Rph []  Vernell Daunt) Leonce, PharmD []  Eva Allis, PharmD []  Rosaline Millet, PharmD []  Iantha Batch, PharmD []  Arvin Gauss, PharmD []  Wanda Hasting, PharmD []  Ronal Rav, PharmD []  Rocky Slade, PharmD []  Bard Jeans, PharmD   Positive Urine culture Treated with no antibiotics,  Admitted and received 3 day of Ceftriaxone . Represented on 12/30 with ABD pain which resolved , no urinary symptoms noted. ASB present likely due to chronic foley. No indication to treat.  Jama Lisle Blondie 05/25/2023, 10:14 AM

## 2023-06-13 ENCOUNTER — Inpatient Hospital Stay (HOSPITAL_COMMUNITY)
Admission: EM | Admit: 2023-06-13 | Discharge: 2023-06-16 | DRG: 698 | Disposition: A | Discharge status: Other Acute Inpatient Hospital | Payer: 59 | Source: Skilled Nursing Facility | Attending: Internal Medicine | Admitting: Internal Medicine

## 2023-06-13 ENCOUNTER — Other Ambulatory Visit: Payer: Self-pay

## 2023-06-13 ENCOUNTER — Encounter (HOSPITAL_COMMUNITY): Payer: Self-pay | Admitting: Emergency Medicine

## 2023-06-13 ENCOUNTER — Emergency Department (HOSPITAL_COMMUNITY): Payer: 59

## 2023-06-13 ENCOUNTER — Inpatient Hospital Stay (HOSPITAL_COMMUNITY): Payer: 59

## 2023-06-13 DIAGNOSIS — Y846 Urinary catheterization as the cause of abnormal reaction of the patient, or of later complication, without mention of misadventure at the time of the procedure: Secondary | ICD-10-CM | POA: Diagnosis present

## 2023-06-13 DIAGNOSIS — A419 Sepsis, unspecified organism: Secondary | ICD-10-CM | POA: Diagnosis present

## 2023-06-13 DIAGNOSIS — Z931 Gastrostomy status: Secondary | ICD-10-CM | POA: Diagnosis not present

## 2023-06-13 DIAGNOSIS — Z1152 Encounter for screening for COVID-19: Secondary | ICD-10-CM | POA: Diagnosis not present

## 2023-06-13 DIAGNOSIS — A4102 Sepsis due to Methicillin resistant Staphylococcus aureus: Secondary | ICD-10-CM | POA: Diagnosis present

## 2023-06-13 DIAGNOSIS — R197 Diarrhea, unspecified: Secondary | ICD-10-CM | POA: Diagnosis not present

## 2023-06-13 DIAGNOSIS — N390 Urinary tract infection, site not specified: Secondary | ICD-10-CM | POA: Diagnosis present

## 2023-06-13 DIAGNOSIS — Z881 Allergy status to other antibiotic agents status: Secondary | ICD-10-CM

## 2023-06-13 DIAGNOSIS — R509 Fever, unspecified: Secondary | ICD-10-CM

## 2023-06-13 DIAGNOSIS — F840 Autistic disorder: Secondary | ICD-10-CM | POA: Diagnosis present

## 2023-06-13 DIAGNOSIS — Z888 Allergy status to other drugs, medicaments and biological substances status: Secondary | ICD-10-CM

## 2023-06-13 DIAGNOSIS — T83511A Infection and inflammatory reaction due to indwelling urethral catheter, initial encounter: Principal | ICD-10-CM

## 2023-06-13 DIAGNOSIS — F17210 Nicotine dependence, cigarettes, uncomplicated: Secondary | ICD-10-CM | POA: Diagnosis present

## 2023-06-13 DIAGNOSIS — R112 Nausea with vomiting, unspecified: Principal | ICD-10-CM | POA: Diagnosis present

## 2023-06-13 DIAGNOSIS — Z79899 Other long term (current) drug therapy: Secondary | ICD-10-CM | POA: Diagnosis not present

## 2023-06-13 DIAGNOSIS — R7401 Elevation of levels of liver transaminase levels: Secondary | ICD-10-CM | POA: Diagnosis present

## 2023-06-13 DIAGNOSIS — R1310 Dysphagia, unspecified: Secondary | ICD-10-CM | POA: Diagnosis present

## 2023-06-13 DIAGNOSIS — Z91012 Allergy to eggs: Secondary | ICD-10-CM | POA: Diagnosis not present

## 2023-06-13 DIAGNOSIS — F319 Bipolar disorder, unspecified: Secondary | ICD-10-CM | POA: Diagnosis present

## 2023-06-13 DIAGNOSIS — Z87820 Personal history of traumatic brain injury: Secondary | ICD-10-CM

## 2023-06-13 LAB — CBC WITH DIFFERENTIAL/PLATELET
Abs Immature Granulocytes: 0.08 10*3/uL — ABNORMAL HIGH (ref 0.00–0.07)
Basophils Absolute: 0 10*3/uL (ref 0.0–0.1)
Basophils Relative: 0 %
Eosinophils Absolute: 0 10*3/uL (ref 0.0–0.5)
Eosinophils Relative: 0 %
HCT: 47.9 % (ref 39.0–52.0)
Hemoglobin: 16.1 g/dL (ref 13.0–17.0)
Immature Granulocytes: 1 %
Lymphocytes Relative: 3 %
Lymphs Abs: 0.5 10*3/uL — ABNORMAL LOW (ref 0.7–4.0)
MCH: 28.6 pg (ref 26.0–34.0)
MCHC: 33.6 g/dL (ref 30.0–36.0)
MCV: 85.2 fL (ref 80.0–100.0)
Monocytes Absolute: 0.3 10*3/uL (ref 0.1–1.0)
Monocytes Relative: 2 %
Neutro Abs: 13.3 10*3/uL — ABNORMAL HIGH (ref 1.7–7.7)
Neutrophils Relative %: 94 %
Platelets: 372 10*3/uL (ref 150–400)
RBC: 5.62 MIL/uL (ref 4.22–5.81)
RDW: 12.3 % (ref 11.5–15.5)
WBC: 14.2 10*3/uL — ABNORMAL HIGH (ref 4.0–10.5)
nRBC: 0 % (ref 0.0–0.2)

## 2023-06-13 LAB — URINALYSIS, W/ REFLEX TO CULTURE (INFECTION SUSPECTED)
Bilirubin Urine: NEGATIVE
Glucose, UA: NEGATIVE mg/dL
Ketones, ur: NEGATIVE mg/dL
Nitrite: POSITIVE — AB
Protein, ur: 100 mg/dL — AB
RBC / HPF: >50 RBC/hpf (ref 0–5)
Specific Gravity, Urine: 1.026 (ref 1.005–1.030)
WBC, UA: >50 WBC/hpf (ref 0–5)
pH: 7 (ref 5.0–8.0)

## 2023-06-13 LAB — GLUCOSE, CAPILLARY
Glucose-Capillary: 100 mg/dL — ABNORMAL HIGH (ref 70–99)
Glucose-Capillary: 90 mg/dL (ref 70–99)

## 2023-06-13 LAB — COMPREHENSIVE METABOLIC PANEL
ALT: 120 U/L — ABNORMAL HIGH (ref 0–44)
AST: 46 U/L — ABNORMAL HIGH (ref 15–41)
Albumin: 3.9 g/dL (ref 3.5–5.0)
Alkaline Phosphatase: 123 U/L (ref 38–126)
Anion gap: 13 (ref 5–15)
BUN: 11 mg/dL (ref 6–20)
CO2: 21 mmol/L — ABNORMAL LOW (ref 22–32)
Calcium: 9.6 mg/dL (ref 8.9–10.3)
Chloride: 101 mmol/L (ref 98–111)
Creatinine, Ser: 0.62 mg/dL (ref 0.61–1.24)
GFR, Estimated: >60 mL/min (ref >60)
Glucose, Bld: 110 mg/dL — ABNORMAL HIGH (ref 70–99)
Potassium: 4.4 mmol/L (ref 3.5–5.1)
Sodium: 135 mmol/L (ref 135–145)
Total Bilirubin: 0.4 mg/dL (ref 0.0–1.2)
Total Protein: 7.8 g/dL (ref 6.5–8.1)

## 2023-06-13 LAB — RESP PANEL BY RT-PCR (RSV, FLU A&B, COVID)  RVPGX2
Influenza A by PCR: NEGATIVE
Influenza B by PCR: NEGATIVE
Resp Syncytial Virus by PCR: NEGATIVE
SARS Coronavirus 2 by RT PCR: NEGATIVE

## 2023-06-13 LAB — CBG MONITORING, ED: Glucose-Capillary: 90 mg/dL (ref 70–99)

## 2023-06-13 LAB — APTT: aPTT: 33 s (ref 24–36)

## 2023-06-13 LAB — PROTIME-INR
INR: 1.1 (ref 0.8–1.2)
Prothrombin Time: 14 s (ref 11.4–15.2)

## 2023-06-13 LAB — I-STAT CG4 LACTIC ACID, ED: Lactic Acid, Venous: 1.6 mmol/L (ref 0.5–1.9)

## 2023-06-13 LAB — VALPROIC ACID LEVEL: Valproic Acid Lvl: 39 ug/mL — ABNORMAL LOW (ref 50.0–100.0)

## 2023-06-13 MED ORDER — SODIUM CHLORIDE 0.9 % IV BOLUS (SEPSIS)
1000.0000 mL | Freq: Once | INTRAVENOUS | Status: AC
  Administered 2023-06-13: 1000 mL via INTRAVENOUS

## 2023-06-13 MED ORDER — SODIUM CHLORIDE 0.9 % IV SOLN
2.0000 g | INTRAVENOUS | Status: DC
  Administered 2023-06-14 – 2023-06-16 (×3): 2 g via INTRAVENOUS
  Filled 2023-06-13 (×3): qty 20

## 2023-06-13 MED ORDER — BETHANECHOL CHLORIDE 25 MG PO TABS
25.0000 mg | ORAL_TABLET | Freq: Three times a day (TID) | ORAL | Status: DC
  Administered 2023-06-13 – 2023-06-16 (×8): 25 mg
  Filled 2023-06-13 (×10): qty 1

## 2023-06-13 MED ORDER — ONDANSETRON HCL 4 MG/2ML IJ SOLN
4.0000 mg | Freq: Once | INTRAMUSCULAR | Status: AC
  Administered 2023-06-13: 4 mg via INTRAVENOUS
  Filled 2023-06-13: qty 2

## 2023-06-13 MED ORDER — SODIUM CHLORIDE 0.9 % IV SOLN
2.0000 g | Freq: Once | INTRAVENOUS | Status: AC
Start: 2023-06-13 — End: 2023-06-13
  Administered 2023-06-13: 2 g via INTRAVENOUS
  Filled 2023-06-13: qty 20

## 2023-06-13 MED ORDER — OSMOLITE 1.2 CAL PO LIQD
1000.0000 mL | ORAL | Status: DC
  Administered 2023-06-13: 1000 mL
  Filled 2023-06-13 (×2): qty 1000

## 2023-06-13 MED ORDER — POLYETHYLENE GLYCOL 3350 17 G PO PACK: 17.0000 g | PACK | Freq: Every day | ORAL | Status: DC | PRN

## 2023-06-13 MED ORDER — ACETAMINOPHEN 325 MG PO TABS
650.0000 mg | ORAL_TABLET | Freq: Four times a day (QID) | ORAL | Status: DC | PRN
  Administered 2023-06-13: 650 mg via ORAL
  Filled 2023-06-13: qty 2

## 2023-06-13 MED ORDER — ACETAMINOPHEN 650 MG RE SUPP: 650.0000 mg | Freq: Four times a day (QID) | RECTAL | Status: DC | PRN

## 2023-06-13 MED ORDER — ACETAMINOPHEN 500 MG PO TABS
1000.0000 mg | ORAL_TABLET | Freq: Once | ORAL | Status: AC
  Administered 2023-06-13: 1000 mg via ORAL
  Filled 2023-06-13: qty 2

## 2023-06-13 MED ORDER — ENOXAPARIN SODIUM 40 MG/0.4ML IJ SOSY
40.0000 mg | PREFILLED_SYRINGE | INTRAMUSCULAR | Status: DC
Start: 2023-06-13 — End: 2023-06-16
  Administered 2023-06-13 – 2023-06-16 (×4): 40 mg via SUBCUTANEOUS
  Filled 2023-06-13 (×4): qty 0.4

## 2023-06-13 MED ORDER — DICYCLOMINE HCL 10 MG PO CAPS: 10.0000 mg | ORAL_CAPSULE | Freq: Three times a day (TID) | ORAL | Status: DC | PRN

## 2023-06-13 MED ORDER — FREE WATER
150.0000 mL | Status: DC
  Administered 2023-06-13 – 2023-06-16 (×16): 150 mL

## 2023-06-13 MED ORDER — RISPERIDONE 2 MG PO TABS
2.0000 mg | ORAL_TABLET | Freq: Every day | ORAL | Status: DC
  Administered 2023-06-13 – 2023-06-16 (×4): 2 mg
  Filled 2023-06-13 (×4): qty 1

## 2023-06-13 MED ORDER — ALBUTEROL SULFATE (2.5 MG/3ML) 0.083% IN NEBU: 2.5000 mg | INHALATION_SOLUTION | Freq: Four times a day (QID) | RESPIRATORY_TRACT | Status: DC | PRN

## 2023-06-13 MED ORDER — LORAZEPAM 2 MG/ML PO CONC: 0.5000 mg | Freq: Four times a day (QID) | ORAL | Status: DC | PRN

## 2023-06-13 MED ORDER — OXYBUTYNIN CHLORIDE 5 MG PO TABS
5.0000 mg | ORAL_TABLET | Freq: Every day | ORAL | Status: DC
  Administered 2023-06-13 – 2023-06-15 (×3): 5 mg
  Filled 2023-06-13 (×3): qty 1

## 2023-06-13 MED ORDER — ONDANSETRON HCL 4 MG PO TABS: 4.0000 mg | ORAL_TABLET | Freq: Four times a day (QID) | ORAL | Status: DC | PRN

## 2023-06-13 MED ORDER — OXYCODONE HCL 5 MG PO TABS
5.0000 mg | ORAL_TABLET | Freq: Four times a day (QID) | ORAL | Status: DC | PRN
  Administered 2023-06-14 (×2): 5 mg
  Filled 2023-06-13 (×2): qty 1

## 2023-06-13 MED ORDER — ONDANSETRON HCL 4 MG/2ML IJ SOLN: 4.0000 mg | Freq: Four times a day (QID) | INTRAMUSCULAR | Status: DC | PRN

## 2023-06-13 MED ORDER — OXYBUTYNIN CHLORIDE ER 5 MG PO TB24
5.0000 mg | ORAL_TABLET | Freq: Every day | ORAL | Status: DC
  Filled 2023-06-13: qty 1

## 2023-06-13 MED ORDER — VALPROIC ACID 250 MG/5ML PO SOLN
500.0000 mg | Freq: Three times a day (TID) | ORAL | Status: DC
  Administered 2023-06-13 – 2023-06-15 (×8): 500 mg
  Filled 2023-06-13 (×11): qty 10

## 2023-06-13 MED ORDER — TRIHEXYPHENIDYL HCL 5 MG PO TABS
5.0000 mg | ORAL_TABLET | Freq: Two times a day (BID) | ORAL | Status: DC
  Administered 2023-06-13 – 2023-06-16 (×6): 5 mg
  Filled 2023-06-13 (×7): qty 1

## 2023-06-13 MED ORDER — CHLORHEXIDINE GLUCONATE CLOTH 2 % EX PADS
6.0000 | MEDICATED_PAD | Freq: Every day | CUTANEOUS | Status: DC
  Administered 2023-06-13 – 2023-06-16 (×4): 6 via TOPICAL

## 2023-06-13 NOTE — Progress Notes (Signed)
IVT consult placed for IV placement. Patient has 1 PIV documented (18g in hand). MD order for 1 PIV at this time and all labs appear collected. At this time, no medical need for additional access. Secure chat sent to RN. If needs change, please place new consult.   Deakon Frix Loyola Mast, RN

## 2023-06-13 NOTE — ED Notes (Signed)
1315 Free water 150 mL administered. Unable to sign off for the [facility].

## 2023-06-13 NOTE — ED Triage Notes (Signed)
Per [facility], pt from [facility], call out for emesis and fever.  Reports of "a lot of emesis all over the room."  He is very hot to touch, No tylenol given.  Has a g-tube and foley.    70/40 TAcy 150HR 95% RA CBG 121  Pt is on the spectrum and has a hx of TBI.  Response is reported as slow.

## 2023-06-13 NOTE — H&P (Addendum)
History and Physical    Patient: Alexander Fritz WRU:[id number] DOB: [birth year] DOA: 06/13/2023 DOS: the patient was seen and examined on 06/13/2023 PCP: Patient, No Pcp Per  Patient [facility]: [name]  Chief Complaint:  Chief Complaint  Patient presents with   Fever   Emesis   Tachycardia   HPI: Alexander Fritz is a 33 y.o. male with medical history significant of TBI, autism, bipolar disorder, and tobacco abuse who presented with complaints of nausea, vomiting, and diarrhea at the nursing facility.  Patient has not able to contribute much to history.  He does report having nausea, vomiting, and abdominal pain.  Patient just recently hospitalized on 05/16/23-05/18/2023 with a catheter associated urinary tract infection that grew out Proteus mirabilis from urine culture performed on 12/25.  However repeat cultures from 12/30 were noted to show Enterococcus faecalis.   In the emergency department patient was noted to be febrile up to 102.8 F with tachycardia tachypnea meeting SIRS criteria.  Labs significant for WBC 14.2, AST 46, ALT 120, total bilirubin 0.4, and lactic acid reassuring at 1.6.  Chest x-ray noted no acute abnormality.  Influenza, COVID-19, and RSV screening were negative.  Urinalysis significant for moderate hemoglobin, moderate leukocytes, positive nitrites, many bacteria, greater than 50 RBC/hpf, and greater than 50 WBCs.  Blood and urine cultures have been obtained.  Patient was given  acetaminophen 1000 mg p.o., 1 L normal saline IV fluids, Zofran IV, and 2 g of Rocephin IV.  Review of Systems: unable to review all systems due to the inability of the patient to answer questions. Past Medical History:  Diagnosis Date   Autism    Bipolar 1 disorder [facility])    Past Surgical History:  Procedure Laterality Date   CRANIOTOMY Left 12/24/2022   Procedure: HEMICRANIOTOMY FOR EVACUATION OF SUBDURAL HEMATOMA;  Surgeon: Bethann Goo, DO;  Location: [facility] OR;  Service:  Neurosurgery;  Laterality: Left;   Social History:  reports that he has been smoking cigarettes. He has a 2.5 pack-year smoking history. He uses smokeless tobacco. He reports that he does not drink alcohol and does not use drugs.  Allergies  Allergen Reactions   Cefaclor Other (See Comments)    Unknown.  Per group home MAR.   Egg-Derived Products    Lithium Palpitations and Rash    History reviewed. No pertinent family history.  Prior to Admission medications   Medication Sig Start Date End Date Taking? Authorizing Provider  bethanechol (URECHOLINE) 25 MG tablet Place 1 tablet (25 mg total) into feeding tube 3 (three) times daily. 02/14/23  Yes Maczis, Elmer Sow, PA-C  docusate (COLACE) 50 MG/5ML liquid Place 10 mLs (100 mg total) into feeding tube daily as needed for mild constipation. 02/14/23  Yes Maczis, Elmer Sow, PA-C  oxybutynin (DITROPAN) 5 MG tablet Take 5 mg by mouth every 8 (eight) hours as needed for bladder spasms.   Yes [provider]  oxybutynin (DITROPAN) 5 MG tablet Place 5 mg into feeding tube every 8 (eight) hours as needed for bladder spasms.   Yes [provider]  oxybutynin (DITROPAN-XL) 5 MG 24 hr tablet Take 5 mg by mouth at bedtime. GIVE 1 TABLET PER G TUBE EVERY 8 HOURS AS NEEDED FOR BLADDER / SPASMS   Yes [provider]  oxycodone (OXY-IR) 5 MG capsule Place 5 mg into feeding tube every 6 (six) hours as needed for pain.   Yes [provider]  polycarbophil (FIBERCON) 625 MG tablet  Place 1 tablet (625 mg total) into feeding tube as needed for diarrhea or loose stools. 02/14/23  Yes Maczis, Elmer Sow, PA-C  polyethylene glycol (MIRALAX / GLYCOLAX) 17 g packet Place 17 g into feeding tube daily as needed (constipation). 02/14/23  Yes Maczis, Elmer Sow, PA-C  trihexyphenidyl (ARTANE) 5 MG tablet Place 1 tablet (5 mg total) into feeding tube 2 (two) times daily. 02/14/23  Yes Maczis, Elmer Sow, PA-C  valproic acid (DEPAKENE) 250 MG/5ML  solution Place 10 mLs (500 mg total) into feeding tube every 8 (eight) hours. 02/14/23  Yes Maczis, Elmer Sow, PA-C  hydrOXYzine (ATARAX) 25 MG tablet Place 50 mg into feeding tube every 8 (eight) hours as needed for anxiety.    [provider]  LORazepam (ATIVAN) 2 MG/ML concentrated solution Place 0.25 mLs into feeding tube every 6 (six) hours as needed for anxiety. 0.5mg  = 0.75ml    [provider]  Nutritional Supplements (FEEDING SUPPLEMENT, OSMOLITE 1.5 CAL,) LIQD Place 1,000 mLs into feeding tube continuous. 42ml/hr    [provider]  risperiDONE (RISPERDAL) 2 MG tablet Place 1 tablet (2 mg total) into feeding tube daily. 02/15/23   Maczis, Elmer Sow, PA-C  Water For Irrigation, Sterile (FREE WATER) SOLN Place 150 mLs into feeding tube every 4 (four) hours. Flush tube feeding tube    [provider]    Physical Exam: Vitals:   06/13/23 0730 06/13/23 0815 06/13/23 0822 06/13/23 0823  BP: 106/71 103/65    Pulse: (!) 110 (!) 121  (!) 113  Resp: 15 (!) 23  17  Temp:   99.5 F (37.5 C)   TempSrc:   Oral   SpO2: 100% 100%  100%    Constitutional: Young male who appears to be in no acute distress Eyes: PERRL, lids and conjunctivae normal ENMT: Mucous membranes are dry.   Neck: normal, supple  Respiratory: clear to auscultation bilaterally, no wheezing, no crackles. Normal respiratory effort. No accessory muscle use.  Cardiovascular: Regular rate and rhythm, no murmurs / rubs / gallops. No extremity edema. 2+ pedal pulses. No carotid bruits.  Abdomen: Suprapubic tenderness appreciated on physical exam.  Patient has PEG tube in place.  Foley catheter in place draining dark yellow urine with blood present. Musculoskeletal: no clubbing / cyanosis. No joint deformity upper and lower extremities. Good ROM, no contractures. Normal muscle tone.  Skin: no rashes, lesions, ulcers. No induration Neurologic: CN 2-12 grossly intact. Sensation intact, DTR normal.  Strength 5/5 in all 4.  Psychiatric: Normal judgment and insight. Alert and oriented x 3. Normal mood.   Data Reviewed:  EKG revealed sinus tachycardia at 119 bpm with right axis deviation.  And reviewed labs, imaging, and pertinent records as documented.  Assessment and Plan:  Sepsis secondary to catheter associated urinary tract infection Patient presents with complaints of nausea and vomiting.  Found to be febrile up to 102.8 F with tachycardia into tachypnea meeting SIRS criteria. He was recently hospitalized with a Proteus mirabilis UTI and has a chronic indwelling catheter in place.  Another set of urine cultures from 05/21/2023 also noted Enterococcus faecalis.  Urinalysis today significant for moderate hemoglobin, moderate leukocytes, positive nitrites, many bacteria, greater than 50 RBC/hpf, and greater than 50 WBCs blood and urine cultures were obtained.  Lactic acid was noted to be reassuring at 1.6.  Patient had been started on empiric antibiotics of Rocephin. -Admit to a telemetry bed -Follow-up blood and urine cultures -Orders placed for nursing to change Foley catheter  if not done already -Continue empiric antibiotics of Rocephin.  Adjust antibiotics as deemed medically appropriate -Tylenol as needed for fever -Recheck CBC tomorrow  Nausea, vomiting, and diarrhea Present on admission.  Patient reported having nausea and vomiting, and diarrhea prior to arrival thought secondary to urinary tract infection. -Aspiration precautions with elevation head of the bed -Check abdominal x-ray -Antiemetics as needed  Autism Bipolar disorder History of TBI -Check Depakote level -Continue current medication regimen as tolerated  Dysphagia Status post PEG -Plan to resume tube feeds no signs for obstruction  Transaminitis Acute on chronic.  AST 46 and ALT 120, but had been seen elevated similarly in the past.  Hepatitis panel was noted to be nonreactive obtained on 05/16/2023.    -Continue to monitor  DVT prophylaxis: Lovenox Advance Care Planning:   Code Status: Full Code    Consults: None  Family Communication: Left voicemail on patient's guardians number  Severity of Illness: The appropriate patient status for this patient is INPATIENT. Inpatient status is judged to be reasonable and necessary in order to provide the required intensity of service to ensure the patient's safety. The patient's presenting symptoms, physical exam findings, and initial radiographic and laboratory data in the context of their chronic comorbidities is felt to place them at high risk for further clinical deterioration. Furthermore, it is not anticipated that the patient will be medically stable for discharge from the hospital within 2 midnights of admission.   * I certify that at the point of admission it is my clinical judgment that the patient will require inpatient hospital care spanning beyond 2 midnights from the point of admission due to high intensity of service, high risk for further deterioration and high frequency of surveillance required.*  Author: Clydie Braun, MD 06/13/2023 9:25 AM  For on call review [email or web address].

## 2023-06-13 NOTE — ED Provider Notes (Signed)
[place] EMERGENCY DEPARTMENT AT [facility] Provider Note   CSN: [id number] Arrival date & time: 06/13/23  7425     History  Chief Complaint  Patient presents with   Fever   Emesis   Tachycardia    Alexander Fritz is a 33 y.o. male.  33 yo M with a chief complaints of nausea vomiting and diarrhea.  This was noted today by his nursing facility.  History is somewhat limited due to history of TBI.  Per EMS the patient had had a significant amount of emesis.  Was found to be hypotensive.  Improved somewhat en route.  Warm to the touch, temperature at the facility was 101.   Fever Associated symptoms: vomiting   Emesis Associated symptoms: fever        Home Medications Prior to Admission medications   Medication Sig Start Date End Date Taking? Authorizing Provider  bethanechol (URECHOLINE) 25 MG tablet Place 1 tablet (25 mg total) into feeding tube 3 (three) times daily. 02/14/23   Maczis, Elmer Sow, PA-C  docusate (COLACE) 50 MG/5ML liquid Place 10 mLs (100 mg total) into feeding tube daily as needed for mild constipation. 02/14/23   Maczis, Elmer Sow, PA-C  hydrOXYzine (ATARAX) 25 MG tablet Place 50 mg into feeding tube every 8 (eight) hours as needed for anxiety.    [provider]  LORazepam (ATIVAN) 2 MG/ML concentrated solution Place 0.5 mg into feeding tube every 6 (six) hours as needed for anxiety. 0.5mg  = 0.50ml    [provider]  Nutritional Supplements (FEEDING SUPPLEMENT, OSMOLITE 1.5 CAL,) LIQD Place 1,000 mLs into feeding tube continuous. 73ml/hr    [provider]  polycarbophil (FIBERCON) 625 MG tablet Place 1 tablet (625 mg total) into feeding tube as needed for diarrhea or loose stools. 02/14/23   Maczis, Elmer Sow, PA-C  polyethylene glycol (MIRALAX / GLYCOLAX) 17 g packet Place 17 g into feeding tube daily as needed (constipation). 02/14/23   Maczis, Elmer Sow, PA-C  risperiDONE (RISPERDAL) 2 MG tablet Place 1 tablet (2 mg  total) into feeding tube daily. 02/15/23   Maczis, Elmer Sow, PA-C  trihexyphenidyl (ARTANE) 5 MG tablet Place 1 tablet (5 mg total) into feeding tube 2 (two) times daily. 02/14/23   Maczis, Elmer Sow, PA-C  valproic acid (DEPAKENE) 250 MG/5ML solution Place 10 mLs (500 mg total) into feeding tube every 8 (eight) hours. 02/14/23   Maczis, Elmer Sow, PA-C  Water For Irrigation, Sterile (FREE WATER) SOLN Place 150 mLs into feeding tube every 4 (four) hours. Flush tube feeding tube    [provider]      Allergies    Cefaclor, Egg-derived products, and Lithium    Review of Systems   Review of Systems  Constitutional:  Positive for fever.  Gastrointestinal:  Positive for vomiting.    Physical Exam Updated Vital Signs BP 112/75   Pulse (!) 115   Temp (!) 102.8 F (39.3 C) (Rectal)   Resp 14   SpO2 100%  Physical Exam Vitals and nursing note reviewed.  Constitutional:      Appearance: He is well-developed.  HENT:     Head: Normocephalic and atraumatic.  Eyes:     Pupils: Pupils are equal, round, and reactive to light.  Neck:     Vascular: No JVD.  Cardiovascular:     Rate and Rhythm: Normal rate and regular rhythm.     Heart sounds: No murmur heard.    No friction rub. No  gallop.  Pulmonary:     Effort: No respiratory distress.     Breath sounds: No wheezing.  Abdominal:     General: There is no distension.     Tenderness: There is no abdominal tenderness. There is no guarding or rebound.  Musculoskeletal:        General: Normal range of motion.     Cervical back: Normal range of motion and neck supple.  Skin:    Coloration: Skin is not pale.     Findings: No rash.  Neurological:     Mental Status: He is alert.  Psychiatric:        Behavior: Behavior normal.     ED Results / Procedures / Treatments   Labs (all labs ordered are listed, but only abnormal results are displayed) Labs Reviewed  CULTURE, BLOOD (ROUTINE X 2)  CULTURE, BLOOD (ROUTINE X 2)  RESP  PANEL BY RT-PCR (RSV, FLU A&B, COVID)  RVPGX2  COMPREHENSIVE METABOLIC PANEL  CBC WITH DIFFERENTIAL/PLATELET  PROTIME-INR  APTT  URINALYSIS, W/ REFLEX TO CULTURE (INFECTION SUSPECTED)  I-STAT CG4 LACTIC ACID, ED    EKG EKG Interpretation Date/Time:  Wednesday June 13 2023 06:36:53 EST Ventricular Rate:  119 PR Interval:  125 QRS Duration:  70 QT Interval:  290 QTC Calculation: 408 R Axis:   111  Text Interpretation: Sinus tachycardia Right axis deviation Borderline low voltage, extremity leads No significant change since last tracing Confirmed by Melene Plan [phone]) on 06/13/2023 6:40:03 AM  Radiology DG Chest Port 1 View Result Date: 06/13/2023 CLINICAL DATA:  33 year old male with possible sepsis. Fever and vomiting. EXAM: PORTABLE CHEST 1 VIEW COMPARISON:  Portable chest 01/30/2023 and earlier. FINDINGS: Portable AP semi upright view at 0626 hours. Tracheostomy has been removed. Improved lung volumes. Normal cardiac size and mediastinal contours. Allowing for portable technique the lungs are clear. No pneumothorax or pleural effusion. Negative visible bowel gas and osseous structures. IMPRESSION: Negative portable chest. Electronically Signed   By: Odessa Fleming M.D.   On: 06/13/2023 06:34    Procedures Procedures    Medications Ordered in ED Medications  sodium chloride 0.9 % bolus 1,000 mL (has no administration in time range)  ondansetron (ZOFRAN) injection 4 mg (has no administration in time range)  acetaminophen (TYLENOL) tablet 1,000 mg (has no administration in time range)    ED Course/ Medical Decision Making/ A&P                                 Medical Decision Making Amount and/or Complexity of Data Reviewed Labs: ordered. Radiology: ordered.  Risk Prescription drug management.   33 yo M with a significant past medical history of prior traumatic brain injury comes in with a chief complaints of nausea vomiting and diarrhea.  Occurred acutely.  Hypotensive on  scene with EMS.  Will give a bolus of IV fluids blood work.  Chest x-ray UA.  Lactic a normal.  Awaiting labs.    Patient care signed out to Dr. Denton Lank, please see their note for further details of care in the ED.  The patients results and plan were reviewed and discussed.   Any x-rays performed were independently reviewed by myself.   Differential diagnosis were considered with the presenting HPI.  Medications  sodium chloride 0.9 % bolus 1,000 mL (has no administration in time range)  ondansetron (ZOFRAN) injection 4 mg (has no administration in time range)  acetaminophen (TYLENOL) tablet  1,000 mg (has no administration in time range)    Vitals:   06/13/23 0611 06/13/23 0615 06/13/23 0645 06/13/23 0653  BP: 112/75     Pulse:  (!) 119 (!) 115   Resp:  (!) 24 14   Temp:    (!) 102.8 F (39.3 C)  TempSrc:    Rectal  SpO2:  99% 100%     Final diagnoses:  Nausea vomiting and diarrhea    Admission/ observation were discussed with the admitting physician, patient and/or family and they are comfortable with the plan.          Final Clinical Impression(s) / ED Diagnoses Final diagnoses:  Nausea vomiting and diarrhea    Rx / DC Orders ED Discharge Orders     None         Melene Plan, DO 06/13/23 [phone]

## 2023-06-13 NOTE — Progress Notes (Signed)
Patient transferred from the ED via stretcher at 1820 PM. Alert and oriented. On RA. Connected to the cardiac monitor box 14.  Skin is intact. Came with Foley catheter and G tube.  Will continue to monitor.

## 2023-06-13 NOTE — ED Provider Notes (Addendum)
Signed out to check labs and admit patient.   Ua c/w uti. Cx sent.  Rocephin iv (pt has received without allergic rxn in recent past).   Pt alert, content, no acute distress. Abd soft non tender.  Normal external gu exam.   Hospitalists consulted for admission.      Cathren Laine, MD 06/13/23 [phone]

## 2023-06-14 DIAGNOSIS — A419 Sepsis, unspecified organism: Secondary | ICD-10-CM | POA: Diagnosis not present

## 2023-06-14 LAB — BASIC METABOLIC PANEL
Anion gap: 9 (ref 5–15)
BUN: 6 mg/dL (ref 6–20)
CO2: 26 mmol/L (ref 22–32)
Calcium: 8.4 mg/dL — ABNORMAL LOW (ref 8.9–10.3)
Chloride: 99 mmol/L (ref 98–111)
Creatinine, Ser: 0.53 mg/dL — ABNORMAL LOW (ref 0.61–1.24)
GFR, Estimated: >60 mL/min (ref >60)
Glucose, Bld: 94 mg/dL (ref 70–99)
Potassium: 3.7 mmol/L (ref 3.5–5.1)
Sodium: 134 mmol/L — ABNORMAL LOW (ref 135–145)

## 2023-06-14 LAB — CBC
HCT: 38.3 % — ABNORMAL LOW (ref 39.0–52.0)
Hemoglobin: 13 g/dL (ref 13.0–17.0)
MCH: 28.4 pg (ref 26.0–34.0)
MCHC: 33.9 g/dL (ref 30.0–36.0)
MCV: 83.6 fL (ref 80.0–100.0)
Platelets: 278 10*3/uL (ref 150–400)
RBC: 4.58 MIL/uL (ref 4.22–5.81)
RDW: 12.5 % (ref 11.5–15.5)
WBC: 3.3 10*3/uL — ABNORMAL LOW (ref 4.0–10.5)
nRBC: 0 % (ref 0.0–0.2)

## 2023-06-14 LAB — GLUCOSE, CAPILLARY
Glucose-Capillary: 101 mg/dL — ABNORMAL HIGH (ref 70–99)
Glucose-Capillary: 103 mg/dL — ABNORMAL HIGH (ref 70–99)
Glucose-Capillary: 107 mg/dL — ABNORMAL HIGH (ref 70–99)
Glucose-Capillary: 108 mg/dL — ABNORMAL HIGH (ref 70–99)
Glucose-Capillary: 88 mg/dL (ref 70–99)
Glucose-Capillary: 95 mg/dL (ref 70–99)

## 2023-06-14 MED ORDER — JUVEN PO PACK
1.0000 | PACK | Freq: Two times a day (BID) | ORAL | Status: DC
  Administered 2023-06-15 – 2023-06-16 (×3): 1
  Filled 2023-06-14 (×3): qty 1

## 2023-06-14 MED ORDER — OSMOLITE 1.5 CAL PO LIQD
1000.0000 mL | ORAL | Status: DC
  Administered 2023-06-14: 1000 mL
  Filled 2023-06-14 (×2): qty 1000

## 2023-06-14 MED ORDER — PROSOURCE TF20 ENFIT COMPATIBL EN LIQD
60.0000 mL | Freq: Every day | ENTERAL | Status: DC
  Administered 2023-06-14 – 2023-06-16 (×3): 60 mL
  Filled 2023-06-14 (×3): qty 60

## 2023-06-14 NOTE — Progress Notes (Signed)
Initial Nutrition Assessment  DOCUMENTATION CODES:   Not applicable  INTERVENTION:   TF via PEG: Transition to Osmolite 1.5 at 55 ml/h (1320 ml per day) Prosource TF20 60 ml once daily Free water flushes q4h (provides per day) Provides 1980 kcal, 83 gm protein, 1006 ml free water daily  Juven BID to support wound healing  NUTRITION DIAGNOSIS:   Inadequate oral intake related to chronic illness, dysphagia as evidenced by NPO status.  GOAL:   Patient will meet greater than or equal to 90% of their needs  MONITOR:   Labs, Weight trends, Skin, TF tolerance  REASON FOR ASSESSMENT:   Consult Enteral/tube feeding initiation and management (trickles)  ASSESSMENT:   Pt admitted from [facility] with c/o nausea, vomiting and diarrhea d/t sepsis secondary to UTI. PMH significant for TBI, autism, bipolar disorder, tobacco use, PEG tube dependence. Recently admitted 05/16/23-05/18/23 with UTI  1/22 s/p abd xray- gas-filled nondilated loops of bowel throughout abdomen  Assessed pt at bedside. No visitors present at time of visit. Pt is a limited historian and difficult to understand at times; documented to be oriented x2. He mentions that he likes to eat apple sauce and meatloaf. Uncertain if pt was consuming a PO diet PTA. Pt denies current nausea or emesis.   SLP assessed pt today. Plans for MBS tomorrow.   TF infusing via PEG at time of visit.  Current infusion: Osmolite 1.2 at 79ml/hr   Pt endorses lifting weights for 2 hours daily. Mobility status unclear. Pt did not point/flex toes when asked by RD during nutrition focused physical exam.   Medications: IV abx  Labs:  Sodium 134 Cr 0.53 CBG's 90-107 x24 hours  NUTRITION - FOCUSED PHYSICAL EXAM: Noted mild to moderate subcutaneous fat and muscle depletions on nutrition focused physical exam however unable to confirm diagnosis of malnutrition at this time given limited history and unclear mobility status  which could impact muscle deficits.  Flowsheet Row Most Recent Value  Orbital Region No depletion  Upper Arm Region Moderate depletion  Thoracic and Lumbar Region Mild depletion  Buccal Region No depletion  Temple Region No depletion  Clavicle Bone Region Moderate depletion  Clavicle and Acromion Bone Region No depletion  Scapular Bone Region No depletion  Dorsal Hand Mild depletion  Patellar Region Moderate depletion  Anterior Thigh Region Moderate depletion  Posterior Calf Region Severe depletion  Edema (RD Assessment) None  Hair Reviewed  Eyes Reviewed  Mouth Reviewed  Skin Reviewed  Nails Reviewed   Diet Order:   Diet Order             Diet NPO time specified  Diet effective now                   EDUCATION NEEDS:   No education needs have been identified at this time  Skin:  Skin Assessment: Skin Integrity Issues: Skin Integrity Issues:: Stage II Stage II: coccyx  Last BM:  unknown  Height:   Ht Readings from Last 1 Encounters:  06/14/23 6' (1.829 m)    Weight:   Wt Readings from Last 1 Encounters:  06/14/23 70.9 kg   BMI:  Body mass index is 21.2 kg/m.  Estimated Nutritional Needs:   Kcal:  2000-2200  Protein:  100-115g  Fluid:  >/=2L  Drusilla Kanner, RDN, LDN Clinical Nutrition

## 2023-06-14 NOTE — Progress Notes (Addendum)
PROGRESS NOTE  KAMAREON OAKLEY WUJ:[id number] DOB: November 04, 1990 DOA: 06/13/2023 PCP: Patient, No Pcp Per   LOS: 1 day   Brief narrative:  Alexander Fritz is a 33 y.o. male with medical history significant of TBI, autism, bipolar disorder, and tobacco abuse who presented to hospital with complaints of nausea, vomiting, and diarrhea at the nursing facility.  Patient just recently hospitalized on 05/16/23-05/18/2023 with a catheter associated urinary tract infection that grew out Proteus mirabilis from urine culture performed on 12/25.  However repeat cultures from 12/30 were noted to show Enterococcus faecalis.  In the ED, patient was febrile up to 102.8 F with tachycardia, tachypnea with labs concerning for leukocytosis at 14.2, AST 46, ALT 120. Chest x-ray without infiltrate.   Influenza, COVID-19, and RSV screening were negative.  Urinalysis significant for moderate hemoglobin, moderate leukocytes, positive nitrites, many bacteria, greater than 50 RBC/hpf, and greater than 50 WBCs.  Blood and urine cultures were obtained, patient received acetaminophen 1000 mg p.o., 1 L normal saline IV fluids, Zofran IV, and 2 g of Rocephin IV was considered for admission to the hospital for further evaluation and treatment.       Assessment/Plan: Principal Problem:   Sepsis (HCC) Active Problems:   Catheter-associated urinary tract infection (HCC)   Nausea, vomiting, and diarrhea   Autism spectrum disorder   Bipolar 1 disorder (HCC)   History of traumatic brain injury   S/P percutaneous endoscopic gastrostomy (PEG) tube placement (HCC)  Sepsis secondary to catheter associated urinary tract infection Met sepsis criteria with fever tachycardia, tachypnea and abnormal urinalysis.  Lactate was normal.  Recently hospitalized with a Proteus mirabilis UTI and has a chronic indwelling catheter in place.  Another set of urine cultures from 05/21/2023 also noted Enterococcus faecalis.  Continue IV Rocephin.  Follow-up  blood cultures urine cultures.  Blood cultures negative in less than 12 hours.  Exchange of Foley catheter.  WBC today at 3.3.  Continue hydration through PEG tube free water.  Chronic indwelling Foley catheter.  Was supposed to follow-up with urology as outpatient to address Foley catheter issues.  Discussed with the legal guardian about it.  Encouraged to keep up that appointment.  Nausea, vomiting, and diarrhea Secondary to UTI.  Hasgastrostomy tube.  Abdominal x-ray showed diffuse gas-filled nondilated loops of bowel.  Continue with PEG tube feeding with water flushes.   Autism/Bipolar disorder/History of TBI On Depakote.  Depakote level was 39 better than level 1 months back.  Dysphagia Status post PEG Abdominal x-ray without any obstruction.  Continue tube feeding. Will get speech and swallow evaluation.  Legal guardian was not quite sure whether he could handle p.o. but he was okay with ice cubes.  Will see what he does here in the hospital with speech therapy..   Transaminitis Acute on chronic.  AST 46 and ALT 120, but had been seen elevated similarly in the past.  Reportedly panel recently done was negative.  DVT prophylaxis: enoxaparin (LOVENOX) injection 40 mg Start: 06/13/23 0945   Disposition: SNF  Status is: Inpatient  Remains inpatient appropriate because: sepsis, IV antibiotics.    Code Status:     Code Status: Full Code  Family Communication: I spoke with the legal guardian on the phone and updated her about the clinical condition of the patient.  Consultants: None  Procedures: None  Anti-infectives:  Rocephin IV.  Anti-infectives (From admission, onward)    Start     Dose/Rate Route Frequency Ordered Stop   06/14/23 0800  cefTRIAXone (ROCEPHIN) 2 g in sodium chloride 0.9 % 100 mL IVPB        2 g 200 mL/hr over 30 Minutes Intravenous Every 24 hours 06/13/23 1015     06/13/23 0815  cefTRIAXone (ROCEPHIN) 2 g in sodium chloride 0.9 % 100 mL IVPB        2  g 200 mL/hr over 30 Minutes Intravenous  Once 06/13/23 0805 06/13/23 0925        Subjective: Today, patient was seen and examined at bedside.  Patient has history of autism.  Wants to drink something by mouth.  Nursing staff with no other interval complaints.  Objective: Vitals:   06/14/23 0347 06/14/23 0759  BP: 92/79 94/62  Pulse: 84 75  Resp: 18 17  Temp: 97.8 F (36.6 C) 98.2 F (36.8 C)  SpO2: 100% 98%    Intake/Output Summary (Last 24 hours) at 06/14/2023 0957 Last data filed at 06/14/2023 1610 Gross per 24 hour  Intake 475 ml  Output 450 ml  Net 25 ml   Filed Weights   06/14/23 0500  Weight: 70.9 kg   Body mass index is 21.2 kg/m.   Physical Exam:  GENERAL: Patient is alert awake, mildly Communicative, cognitively impaired not in obvious distress. HENT: No scleral pallor or icterus. Pupils equally reactive to light. Oral mucosa is moist NECK: is supple, no gross swelling noted. CHEST: Clear to auscultation. No crackles or wheezes.   CVS: S1 and S2 heard, no murmur. Regular rate and rhythm.  ABDOMEN: Soft, non-tender, bowel sounds are present.  Foley catheter in place. EXTREMITIES: No edema. CNS: Cranial nerves are intact.  Moves all extremities. SKIN: warm and dry without rashes.  Data Review: I have personally reviewed the following laboratory data and studies,  CBC: Recent Labs  Lab 06/13/23 0612 06/14/23 0703  WBC 14.2* 3.3*  NEUTROABS 13.3*  --   HGB 16.1 13.0  HCT 47.9 38.3*  MCV 85.2 83.6  PLT 372 278   Basic Metabolic Panel: Recent Labs  Lab 06/13/23 0612 06/14/23 0703  NA 135 134*  K 4.4 3.7  CL 101 99  CO2 21* 26  GLUCOSE 110* 94  BUN 11 6  CREATININE 0.62 0.53*  CALCIUM 9.6 8.4*   Liver Function Tests: Recent Labs  Lab 06/13/23 0612  AST 46*  ALT 120*  ALKPHOS 123  BILITOT 0.4  PROT 7.8  ALBUMIN 3.9   No results for input(s): "LIPASE", "AMYLASE" in the last 168 hours. No results for input(s): "AMMONIA" in the last  168 hours. Cardiac Enzymes: No results for input(s): "CKTOTAL", "CKMB", "CKMBINDEX", "TROPONINI" in the last 168 hours. BNP (last 3 results) No results for input(s): "BNP" in the last 8760 hours.  ProBNP (last 3 results) No results for input(s): "PROBNP" in the last 8760 hours.  CBG: Recent Labs  Lab 06/13/23 2141 06/13/23 2342 06/14/23 0021 06/14/23 0350 06/14/23 0828  GLUCAP 90 100* 107* 95 101*   Recent Results (from the past 240 hours)  Resp panel by RT-PCR (RSV, Flu A&B, Covid) Anterior Nasal Swab     Status: None   Collection Time: 06/13/23  6:13 AM   Specimen: Anterior Nasal Swab  Result Value Ref Range Status   SARS Coronavirus 2 by RT PCR NEGATIVE NEGATIVE Final   Influenza A by PCR NEGATIVE NEGATIVE Final   Influenza B by PCR NEGATIVE NEGATIVE Final    Comment: (NOTE) The Xpert Xpress SARS-CoV-2/FLU/RSV plus assay is intended as an aid in the diagnosis of  influenza from Nasopharyngeal swab specimens and should not be used as a sole basis for treatment. Nasal washings and aspirates are unacceptable for Xpert Xpress SARS-CoV-2/FLU/RSV testing.  Fact Sheet for Patients: BloggerCourse.com  Fact Sheet for Healthcare Providers: SeriousBroker.it  This test is not yet approved or cleared by the [place] FDA and has been authorized for detection and/or diagnosis of SARS-CoV-2 by FDA under an Emergency Use Authorization (EUA). This EUA will remain in effect (meaning this test can be used) for the duration of the COVID-19 declaration under Section 564(b)(1) of the Act, 21 U.S.C. section 360bbb-3(b)(1), unless the authorization is terminated or revoked.     Resp Syncytial Virus by PCR NEGATIVE NEGATIVE Final    Comment: (NOTE) Fact Sheet for Patients: BloggerCourse.com  Fact Sheet for Healthcare Providers: SeriousBroker.it  This test is not yet approved or  cleared by the [place] FDA and has been authorized for detection and/or diagnosis of SARS-CoV-2 by FDA under an Emergency Use Authorization (EUA). This EUA will remain in effect (meaning this test can be used) for the duration of the COVID-19 declaration under Section 564(b)(1) of the Act, 21 U.S.C. section 360bbb-3(b)(1), unless the authorization is terminated or revoked.  Performed at [facility], [address], [place], [place] [address]   Blood Culture (routine x 2)     Status: None (Preliminary result)   Collection Time: 06/13/23  6:41 AM   Specimen: BLOOD LEFT HAND  Result Value Ref Range Status   Specimen Description BLOOD LEFT HAND  Final   Special Requests   Final    BOTTLES DRAWN AEROBIC AND ANAEROBIC Blood Culture results may not be optimal due to an inadequate volume of blood received in culture bottles   Culture   Final    NO GROWTH < 12 HOURS Performed at [facility], [address] [place], [place] [address]    Report Status PENDING  Incomplete  Urine Culture     Status: None (Preliminary result)   Collection Time: 06/13/23  6:41 AM   Specimen: Urine, Random  Result Value Ref Range Status   Specimen Description URINE, RANDOM  Final   Special Requests URINE, CLEAN CATCH  Final   Culture   Final    CULTURE REINCUBATED FOR BETTER GROWTH Performed at [facility], [address] [place], [place] [address]    Report Status PENDING  Incomplete  Blood Culture (routine x 2)     Status: None (Preliminary result)   Collection Time: 06/13/23  8:15 AM   Specimen: BLOOD RIGHT HAND  Result Value Ref Range Status   Specimen Description BLOOD RIGHT HAND  Final   Special Requests   Final    AEROBIC BOTTLE ONLY Blood Culture results may not be optimal due to an inadequate volume of blood received in culture bottles   Culture   Final    NO GROWTH <12 HOURS Performed at [facility], [address], [place], [place] [address]    Report Status  PENDING  Incomplete     Studies: DG Abd Portable 1V Result Date: 06/13/2023 CLINICAL DATA:  Nausea and vomiting EXAM: PORTABLE ABDOMEN - 1 VIEW COMPARISON:  Abdomen and pelvis dated 05/21/2023 FINDINGS: Percutaneous gastrostomy tube projects over the left upper quadrant. Diffuse gas-filled nondilated loops of bowel throughout the abdomen. No acute osseous abnormalities. No radiopaque calculi. IMPRESSION: Diffuse gas-filled nondilated loops of bowel throughout the abdomen, nonspecific. Electronically Signed   By: Milus Height.D.  On: 06/13/2023 14:07   DG Chest Port 1 View Result Date: 06/13/2023 CLINICAL DATA:  33 year old male with possible sepsis. Fever and vomiting. EXAM: PORTABLE CHEST 1 VIEW COMPARISON:  Portable chest 01/30/2023 and earlier. FINDINGS: Portable AP semi upright view at 0626 hours. Tracheostomy has been removed. Improved lung volumes. Normal cardiac size and mediastinal contours. Allowing for portable technique the lungs are clear. No pneumothorax or pleural effusion. Negative visible bowel gas and osseous structures. IMPRESSION: Negative portable chest. Electronically Signed   By: Odessa Fleming M.D.   On: 06/13/2023 06:34      Joycelyn Das, MD  [facility] 06/14/2023  If 7PM-7AM, please contact night-coverage

## 2023-06-14 NOTE — TOC Initial Note (Addendum)
Transition of Care [facility]) - Initial/Assessment Note    Patient Details  Name: Alexander Fritz MRN: [id number] Date of Birth: [birth year]  [facility]) CM/SW Contact:    Baldemar Lenis, LCSW Phone Number: 06/14/2023, 1:13 PM  Clinical Narrative:     CSW spoke with patient's legal guardian, Dorathy Daft, and [employer]. Confirmed that patient is LTC resident and can return when stable. CSW to follow. Legal guardian would like the patient's discharge summary sent to her when available ([email or web address]).     Expected Discharge Plan: Long Term Nursing Home Barriers to Discharge: Continued Medical Work up   Patient Goals and CMS Choice Patient states their goals for this hospitalization and ongoing recovery are:: patient unable to participate in goal setting, not fully oriented CMS Medicare.gov Compare Post Acute Care list provided to:: Legal Guardian Choice offered to / list presented to : [facility] POA / Guardian [place] ownership interest in [facility].provided to:: [facility] POA / Guardian    Expected Discharge Plan and Services     Post Acute Care Choice: Skilled Nursing Facility Living arrangements for the past 2 months: Skilled Nursing Facility                                      Prior Living Arrangements/Services Living arrangements for the past 2 months: Skilled Nursing Facility Lives with:: Facility Resident Patient language and need for interpreter reviewed:: No Do you feel safe going back to the place where you live?: Yes      Need for Family Participation in Patient Care: Yes (Comment) Care giver support system in place?: Yes (comment)   Criminal Activity/Legal Involvement Pertinent to Current Situation/Hospitalization: No - Comment as needed  Activities of Daily Living   ADL Screening (condition at time of admission) Independently performs ADLs?: No Does the patient have a NEW difficulty with bathing/dressing/toileting/self-feeding that is  expected to last >3 days?: No Does the patient have a NEW difficulty with getting in/out of bed, walking, or climbing stairs that is expected to last >3 days?: No Does the patient have a NEW difficulty with communication that is expected to last >3 days?: No Is the patient deaf or have difficulty hearing?: No Does the patient have difficulty seeing, even when wearing glasses/contacts?: No Does the patient have difficulty concentrating, remembering, or making decisions?: Yes  Permission Sought/Granted Permission sought to share information with : Facility Medical sales representative, Guardian Permission granted to share information with : Yes, Verbal Permission Granted  Share Information with NAME: Dorathy Daft  Permission granted to share info w AGENCY: [name]  Permission granted to share info w Relationship: Legal Guardian     Emotional Assessment   Attitude/Demeanor/Rapport: Unable to Assess Affect (typically observed): Unable to Assess Orientation: : Oriented to Self, Oriented to Place Alcohol / Substance Use: Not Applicable Psych Involvement: No (comment)  Admission diagnosis:  Acute urinary tract infection [N39.0] Acute febrile illness [R50.9] Sepsis secondary to UTI (HCC) [A41.9, N39.0] Nausea vomiting and diarrhea [R11.2, R19.7] Patient Active Problem List   Diagnosis Date Noted   Sepsis (HCC) 06/13/2023   Nausea, vomiting, and diarrhea 06/13/2023   S/P percutaneous endoscopic gastrostomy (PEG) tube placement (HCC) 06/13/2023   Catheter-associated urinary tract infection (HCC) 05/16/2023   SIRS (systemic inflammatory response syndrome) (HCC) 05/16/2023   Elevated liver enzymes 05/16/2023   Generalized weakness 05/16/2023   History of traumatic brain injury 12/24/2022  Suicidal ideation    Visual hallucination    Auditory hallucination 01/31/2017   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 [place] [address] Phone: [phone] Fax: [phone]  [facility] - [place], [place] - [address] [place] [address] Phone: [phone] Fax: [phone]     Social Drivers of Health (SDOH) Social History: SDOH Screenings   Food Insecurity: No Food Insecurity (06/14/2023)  Housing: Low Risk  (06/14/2023)  Transportation Needs: No Transportation Needs (06/14/2023)  Utilities: Not At Risk (06/14/2023)  Tobacco Use: High Risk (06/13/2023)   SDOH Interventions:     Readmission Risk Interventions     No data to display

## 2023-06-14 NOTE — Hospital Course (Signed)
Alexander Fritz is a 33 y.o. male with medical history significant of TBI, autism, bipolar disorder, and tobacco abuse who presented to hospital with complaints of nausea, vomiting, and diarrhea at the nursing facility.  Patient just recently hospitalized on 05/16/23-05/18/2023 with a catheter associated urinary tract infection that grew out Proteus mirabilis from urine culture performed on 12/25.  However repeat cultures from 12/30 were noted to show Enterococcus faecalis.  In the ED, patient was febrile up to 102.8 F with tachycardia, tachypnea with labs concerning for leukocytosis at 14.2, AST 46, ALT 120. Chest x-ray without infiltrate.   Influenza, COVID-19, and RSV screening were negative.  Urinalysis significant for moderate hemoglobin, moderate leukocytes, positive nitrites, many bacteria, greater than 50 RBC/hpf, and greater than 50 WBCs.  Blood and urine cultures were obtained, patient received acetaminophen 1000 mg p.o., 1 L normal saline IV fluids, Zofran IV, and 2 g of Rocephin IV was considered for admission to the hospital for further evaluation and treatment.  Sepsis secondary to catheter associated urinary tract infection Met sepsis criteria with fever tachycardia tachypnea and abnormal urinalysis.  Lactate was normal.  Recently hospitalized with a Proteus mirabilis UTI and has a chronic indwelling catheter in place.  Another set of urine cultures from 05/21/2023 also noted Enterococcus faecalis.  Continue IV Rocephin.  Follow-up blood cultures urine cultures.  Blood cultures negative in less than 12 hours.  Exchange of Foley catheter.  WBC today at 3.3.  Nausea, vomiting, and diarrhea Secondary to UTI.  Has progressed gastrostomy tube.  Abdominal x-ray showed diffuse gas-filled nondilated loops of bowel.   Autism Bipolar disorder History of TBI On Depakote.  Depakote level was 39 better than level 1 months back.  Dysphagia Status post PEG Abdominal x-ray without any obstruction.   Continue tube feeding.   Transaminitis Acute on chronic.  AST 46 and ALT 120, but had been seen elevated similarly in the past.  Reportedly panel recently done was negative.

## 2023-06-14 NOTE — Plan of Care (Signed)

## 2023-06-14 NOTE — Evaluation (Signed)
Clinical/Bedside Swallow Evaluation Patient Details  Name: Alexander Fritz MRN: [id number] Date of Birth: 03/17/1991  Today's Date: 06/14/2023 Time: SLP Start Time (ACUTE ONLY): 1228 SLP Stop Time (ACUTE ONLY): 1241 SLP Time Calculation (min) (ACUTE ONLY): 13 min  Past Medical History:  Past Medical History:  Diagnosis Date   Autism    Bipolar 1 disorder [facility])    Past Surgical History:  Past Surgical History:  Procedure Laterality Date   CRANIOTOMY Left 12/24/2022   Procedure: HEMICRANIOTOMY FOR EVACUATION OF SUBDURAL HEMATOMA;  Surgeon: Bethann Goo, DO;  Location: [facility] OR;  Service: Neurosurgery;  Laterality: Left;   HPI:  Alexander Fritz is a 33 y.o. male with medical history significant of TBI, autism, bipolar disorder, currently has PEG and tobacco abuse who presented to hospital with complaints of nausea, vomiting, and diarrhea at the nursing facility.  Patient just recently hospitalized on 05/16/23-05/18/2023 with a catheter associated urinary tract infection cultures 12/30 were noted to show Enterococcus faecalis.  In the ED, patient was febrile up to 102.8 F with tachycardia, tachypnea with labs concerning for leukocytosis at 14.2, AST 46, ALT 120. Chest x-ray without infiltrate.  Per MD note "legal guardian was not sure whether pt could handle po but he was okay with ice chips."    Assessment / Plan / Recommendation  Clinical Impression  SLP called SNF who stated they do not know reason pt has a PEG and that he was not consuming po's, receiving all nutrition via feeding tube. No notes located on care everywhere tab and SLP left message for pt's legal guardian. Pt's dentition is intact although in poor condition and there was no asymmetry or weakness noted on oromotor exam. Attempts at volitional cough was more of a vocalization than a cough. Pt participatory however difficult to understand at times and some distractibilty needing redirection. He accepted sips straw sips of thin liquid with  adequate labial seal and consumed sequential sips throughout assessment without concern for decreased airway protection. Trials applesauce transited and timely appearing swallow initiated. He masticated solid texture without delays or oral residue and vocal quality remained clear. Given uknown reason for PEG, more conservative recommendation is to perform an MBS to objectively evaluate swallow function and make  safest recommendations. MBS can be done tomorrow. Continue PEG for nutrition. Pt may have ice chips after oral care. SLP Visit Diagnosis: Dysphagia, unspecified (R13.10)    Aspiration Risk  No limitations    Diet Recommendation Ice chips PRN after oral care    Medication Administration: Via alternative means    Other  Recommendations Oral Care Recommendations: Oral care QID    Recommendations for follow up therapy are one component of a multi-disciplinary discharge planning process, led by the attending physician.  Recommendations may be updated based on patient status, additional functional criteria and insurance authorization.  Follow up Recommendations  (TBD)      Assistance Recommended at Discharge    Functional Status Assessment Patient has had a recent decline in their functional status and demonstrates the ability to make significant improvements in function in a reasonable and predictable amount of time.  Frequency and Duration min 2x/week  2 weeks       Prognosis Prognosis for improved oropharyngeal function:  (TBD)      Swallow Study   General Date of Onset: 06/13/23 HPI: Alexander Fritz is a 33 y.o. male with medical history significant of TBI, autism, bipolar disorder, currently has PEG and tobacco abuse who presented to  hospital with complaints of nausea, vomiting, and diarrhea at the nursing facility.  Patient just recently hospitalized on 05/16/23-05/18/2023 with a catheter associated urinary tract infection cultures 12/30 were noted to show Enterococcus faecalis.   In the ED, patient was febrile up to 102.8 F with tachycardia, tachypnea with labs concerning for leukocytosis at 14.2, AST 46, ALT 120. Chest x-ray without infiltrate.  Per MD note "legal guardian was not sure whether pt could handle po but he was okay with ice chips." Type of Study: Bedside Swallow Evaluation Previous Swallow Assessment:  (cannot find prior documentation) Diet Prior to this Study: NPO;G-tube Temperature Spikes Noted: No Respiratory Status: Room air History of Recent Intubation: No Behavior/Cognition: Alert;Cooperative;Requires cueing;Distractible Oral Cavity Assessment: Within Functional Limits Oral Care Completed by SLP: No Oral Cavity - Dentition: Adequate natural dentition;Poor condition Vision: Functional for self-feeding Self-Feeding Abilities: Able to feed self Patient Positioning: Upright in bed Baseline Vocal Quality: Normal Volitional Cough: Weak (more like vocalization than true cough) Volitional Swallow: Able to elicit    Oral/Motor/Sensory Function Overall Oral Motor/Sensory Function:  (difficulty executing but no asymmetry or weakness)   Ice Chips Ice chips: Not tested   Thin Liquid Thin Liquid: Within functional limits Presentation: Cup;Straw    Nectar Thick Nectar Thick Liquid: Not tested   Honey Thick Honey Thick Liquid: Not tested   Puree Puree: Within functional limits   Solid     Solid: Within functional limits      Roque Cash Breck Coons 06/14/2023,1:40 PM

## 2023-06-14 NOTE — [place] FL2 (Signed)
[place] MEDICAID FL2 LEVEL OF CARE FORM     IDENTIFICATION  Patient Name: Alexander Fritz Birthdate: 18-Nov-1990 Sex: male Admission Date (Current Location): 06/13/2023  [facility] and [place] Number:  Producer, television/film/video and Address:  [facility], [address], [place], [place] [address]      Provider Number: [id number]  Attending Physician Name and Address:  Joycelyn Das, MD  Relative Name and Phone Number:       Current Level of Care: Hospital Recommended Level of Care: Skilled Nursing Facility Prior Approval Number:    Date Approved/Denied:   PASRR Number:    Discharge Plan: SNF    Current Diagnoses: Patient Active Problem List   Diagnosis Date Noted   Sepsis (HCC) 06/13/2023   Nausea, vomiting, and diarrhea 06/13/2023   S/P percutaneous endoscopic gastrostomy (PEG) tube placement (HCC) 06/13/2023   Catheter-associated urinary tract infection (HCC) 05/16/2023   SIRS (systemic inflammatory response syndrome) (HCC) 05/16/2023   Elevated liver enzymes 05/16/2023   Generalized weakness 05/16/2023   History of traumatic brain injury 12/24/2022   Suicidal ideation    Visual hallucination    Auditory hallucination 01/31/2017   Adjustment disorder with disturbance of conduct 11/23/2016   Schizoaffective disorder, bipolar type (HCC) 10/05/2016   Autism spectrum disorder 08/26/2016   Bipolar 1 disorder (HCC) 08/26/2016    Orientation RESPIRATION BLADDER Height & Weight     Self, Place  Normal Indwelling catheter Weight: 156 lb 4.9 oz (70.9 kg) Height:  6' (182.9 cm)  BEHAVIORAL SYMPTOMS/MOOD NEUROLOGICAL BOWEL NUTRITION STATUS      Incontinent Feeding tube (Osmolite 1.2)  AMBULATORY STATUS COMMUNICATION OF NEEDS Skin   Extensive Assist Verbally PU Stage and Appropriate Care PU Stage 1 Dressing:  (coccyx: foam dressing, lift every shift to assess and change PRN)                     Personal Care Assistance Level of Assistance   Bathing, Feeding, Dressing Bathing Assistance: Maximum assistance Feeding assistance: Maximum assistance Dressing Assistance: Maximum assistance     Functional Limitations Info             SPECIAL CARE FACTORS FREQUENCY                       Contractures Contractures Info: Not present    Additional Factors Info  Code Status, Allergies, Isolation Precautions Code Status Info: Full Allergies Info: Cefaclor, Egg-derived Products, Lithium     Isolation Precautions Info: Contact precautions: VRE     Current Medications (06/14/2023):  This is the current hospital active medication list Current Facility-Administered Medications  Medication Dose Route Frequency Provider Last Rate Last Admin   acetaminophen (TYLENOL) tablet 650 mg  650 mg Oral Q6H PRN Clydie Braun, MD   650 mg at 06/13/23 1847   Or   acetaminophen (TYLENOL) suppository 650 mg  650 mg Rectal Q6H PRN Madelyn Flavors A, MD       albuterol (PROVENTIL) (2.5 MG/3ML) 0.083% nebulizer solution 2.5 mg  2.5 mg Nebulization Q6H PRN Katrinka Blazing, Rondell A, MD       bethanechol (URECHOLINE) tablet 25 mg  25 mg Per Tube TID Madelyn Flavors A, MD   25 mg at 06/14/23 0945   cefTRIAXone (ROCEPHIN) 2 g in sodium chloride 0.9 % 100 mL IVPB  2 g Intravenous Q24H Madelyn Flavors A, MD 200 mL/hr at 06/14/23 0814 2 g at 06/14/23 0814   Chlorhexidine  Gluconate Cloth 2 % PADS 6 each  6 each Topical Daily Madelyn Flavors A, MD   6 each at 06/14/23 1055   dicyclomine (BENTYL) capsule 10 mg  10 mg Per Tube Q8H PRN Madelyn Flavors A, MD       enoxaparin (LOVENOX) injection 40 mg  40 mg Subcutaneous Q24H Smith, Rondell A, MD   40 mg at 06/14/23 0945   feeding supplement (OSMOLITE 1.2 CAL) liquid 1,000 mL  1,000 mL Per Tube Continuous Madelyn Flavors A, MD 50 mL/hr at 06/13/23 2109 1,000 mL at 06/13/23 2109   free water 150 mL  150 mL Per Tube Q4H Smith, Rondell A, MD   150 mL at 06/14/23 1203   LORazepam (ATIVAN) 2 MG/ML concentrated solution 0.5  mg  0.5 mg Per Tube Q6H PRN Madelyn Flavors A, MD       ondansetron (ZOFRAN) tablet 4 mg  4 mg Oral Q6H PRN Madelyn Flavors A, MD       Or   ondansetron (ZOFRAN) injection 4 mg  4 mg Intravenous Q6H PRN Madelyn Flavors A, MD       oxybutynin (DITROPAN) tablet 5 mg  5 mg Per Tube QHS Smith, Rondell A, MD   5 mg at 06/13/23 2305   oxyCODONE (Oxy IR/ROXICODONE) immediate release tablet 5 mg  5 mg Per Tube Q6H PRN Madelyn Flavors A, MD   5 mg at 06/14/23 8469   polyethylene glycol (MIRALAX / GLYCOLAX) packet 17 g  17 g Per Tube Daily PRN Madelyn Flavors A, MD       risperiDONE (RISPERDAL) tablet 2 mg  2 mg Per Tube Daily Smith, Rondell A, MD   2 mg at 06/14/23 0945   trihexyphenidyl (ARTANE) tablet 5 mg  5 mg Per Tube BID Madelyn Flavors A, MD   5 mg at 06/14/23 0945   valproic acid (DEPAKENE) 250 MG/5ML solution 500 mg  500 mg Per Tube Q8H Smith, Rondell A, MD   500 mg at 06/14/23 0620     Discharge Medications: Please see discharge summary for a list of discharge medications.  Relevant Imaging Results:  Relevant Lab Results:   Additional Information SS#: [id number]  Baldemar Lenis, LCSW

## 2023-06-15 ENCOUNTER — Inpatient Hospital Stay (HOSPITAL_COMMUNITY): Payer: 59

## 2023-06-15 DIAGNOSIS — T83511A Infection and inflammatory reaction due to indwelling urethral catheter, initial encounter: Secondary | ICD-10-CM | POA: Diagnosis not present

## 2023-06-15 DIAGNOSIS — A419 Sepsis, unspecified organism: Secondary | ICD-10-CM | POA: Diagnosis not present

## 2023-06-15 DIAGNOSIS — N390 Urinary tract infection, site not specified: Secondary | ICD-10-CM | POA: Diagnosis not present

## 2023-06-15 LAB — BASIC METABOLIC PANEL
Anion gap: 9 (ref 5–15)
BUN: 8 mg/dL (ref 6–20)
CO2: 26 mmol/L (ref 22–32)
Calcium: 8.7 mg/dL — ABNORMAL LOW (ref 8.9–10.3)
Chloride: 103 mmol/L (ref 98–111)
Creatinine, Ser: 0.52 mg/dL — ABNORMAL LOW (ref 0.61–1.24)
GFR, Estimated: >60 mL/min (ref >60)
Glucose, Bld: 116 mg/dL — ABNORMAL HIGH (ref 70–99)
Potassium: 3.9 mmol/L (ref 3.5–5.1)
Sodium: 138 mmol/L (ref 135–145)

## 2023-06-15 LAB — CBC
HCT: 39.8 % (ref 39.0–52.0)
Hemoglobin: 13.5 g/dL (ref 13.0–17.0)
MCH: 28.5 pg (ref 26.0–34.0)
MCHC: 33.9 g/dL (ref 30.0–36.0)
MCV: 84.1 fL (ref 80.0–100.0)
Platelets: 306 10*3/uL (ref 150–400)
RBC: 4.73 MIL/uL (ref 4.22–5.81)
RDW: 12.3 % (ref 11.5–15.5)
WBC: 4 10*3/uL (ref 4.0–10.5)
nRBC: 0 % (ref 0.0–0.2)

## 2023-06-15 LAB — URINE CULTURE: Culture: >=100000 — AB

## 2023-06-15 LAB — MAGNESIUM: Magnesium: 1.9 mg/dL (ref 1.7–2.4)

## 2023-06-15 LAB — GLUCOSE, CAPILLARY
Glucose-Capillary: 101 mg/dL — ABNORMAL HIGH (ref 70–99)
Glucose-Capillary: 121 mg/dL — ABNORMAL HIGH (ref 70–99)
Glucose-Capillary: 87 mg/dL (ref 70–99)
Glucose-Capillary: 87 mg/dL (ref 70–99)
Glucose-Capillary: 99 mg/dL (ref 70–99)

## 2023-06-15 MED ORDER — OSMOLITE 1.5 CAL PO LIQD
1080.0000 mL | ORAL | Status: DC
  Filled 2023-06-15: qty 1185

## 2023-06-15 MED ORDER — OSMOLITE 1.5 CAL PO LIQD
1080.0000 mL | ORAL | Status: DC
  Administered 2023-06-15: 1080 mL
  Filled 2023-06-15 (×3): qty 1185

## 2023-06-15 NOTE — Progress Notes (Signed)
Modified Barium Swallow Study  Patient Details  Name: Alexander Fritz MRN: [id number] Date of Birth: 11/24/1990  Today's Date: 06/15/2023  Modified Barium Swallow completed.  Full report located under Chart Review in the Imaging Section.  History of Present Illness Alexander Fritz is a 33 y.o. male with medical history significant of TBI, autism, bipolar disorder, currently has PEG and tobacco abuse who presented to hospital with complaints of nausea, vomiting, and diarrhea at the nursing facility.  Patient just recently hospitalized on 05/16/23-05/18/2023 with a catheter associated urinary tract infection cultures 12/30 were noted to show Enterococcus faecalis.  In the ED, patient was febrile up to 102.8 F with tachycardia, tachypnea with labs concerning for leukocytosis at 14.2, AST 46, ALT 120. Chest x-ray without infiltrate.  Per MD note "legal guardian was not sure whether pt could handle po but he was okay with ice chips."   Clinical Impression Pt alert and cooperative during MBS in which he demonstrated normal oropharyngeal swallow abilities. He was able to control boluses, transit thin, puree and thick boluses briskly without oral residue. His mastication of solid texture was minimally prolonged however able to transit and clear oral cavity. His pharyngeal phase of swallow was safe and efficient with adequate hyolaryngeal excursion and laryngeal closure without penetration or aspiration with trials presented except thin liquid via straw which resulted in trace, flash penetration in one instance out of multiple trials. There was no pharyngeal residue present. He did have slight decreased pharyngoesophageal distention with trace barium seen below the PES. One instance with honey thick a small amount of honey thick ascended to pyriforms which cleared with subsequent swallows. Barium pill placed in applesauce resulted in immediate gagging and pt needed to expectorate pill. Recommend pt initiate regular  texture/thin liquids, pills crushed in puree and full supervision given history of recent TBI and cognitive impairments to ensure safe and efficient swallowing. Factors that may increase risk of adverse event in presence of aspiration [employer]ye [employer] Clearance Coots 2021): Reduced cognitive function  Swallow Evaluation Recommendations Recommendations: PO diet PO Diet Recommendation: Regular;Thin liquids (Level 0) Liquid Administration via: Straw;Cup Medication Administration: Crushed with puree (or give via G-tube) Supervision: Patient able to self-feed;Full supervision/cueing for swallowing strategies (may need assist with set up and feeding) Swallowing strategies  : Slow rate;Small bites/sips Postural changes: Stay upright 30-60 min after meals Oral care recommendations: Oral care BID (2x/day)      Royce Macadamia 06/15/2023,2:04 PM

## 2023-06-15 NOTE — TOC Progression Note (Signed)
Transition of Care [facility]) - Progression Note    Patient Details  Name: Alexander Fritz MRN: [id number] Date of Birth: [birth year]  Transition of Care [facility]) CM/SW Contact  Bo Rogue A [place], [place] Phone Number: 06/15/2023, 5:06 PM  Clinical Narrative:     CSW reached out to pt's legal guardian, [name], left VM regarding pt's admission.   Pt is from [facility] and can return when medically stable.    TOC will continue to follow.    Expected Discharge Plan: Skilled Nursing Facility Barriers to Discharge: Continued Medical Work up  Expected Discharge Plan and Services     Post Acute Care Choice: Skilled Nursing Facility Living arrangements for the past 2 months: Skilled Nursing Facility                                       Social Determinants of Health (SDOH) Interventions SDOH Screenings   Food Insecurity: No Food Insecurity (06/14/2023)  Housing: Low Risk  (06/14/2023)  Transportation Needs: No Transportation Needs (06/14/2023)  Utilities: Not At Risk (06/14/2023)  Tobacco Use: High Risk (06/13/2023)    Readmission Risk Interventions     No data to display

## 2023-06-15 NOTE — Progress Notes (Signed)
Nutrition Brief Note  RD added to secure chat between SLP and MD inquiring about TF regimen.  Pt is s/p MBS today and SLP able to advance diet to regular with thin consistencies with full supervision given h/o TBI and cognitive impairments.   Will transition pt to nocturnal tube feeding with full note follow up to follow on Monday to assess necessity of ongoing nutrition support.   Current TF regimen: Osmolite 1.5 at 55 ml/h (1320 ml per day) Prosource TF20 60 ml once daily Free water flushes q4h (provides per day) Provides 1980 kcal, 83 gm protein, 1006 ml free water daily  New TF regimen: Transition to nocturnal TF- Infuse Osmolite 1.5 at 90ml x12 hours (1900-0700) ( per day) Provides 1620 kcal (81% minimum needs), 68g protein (68% of minimum needs) and free water daily  RD to continue following, please reach out to "RD inpatient" secure chat with additional nutrition related concerns in the meantime.   Drusilla Kanner, RDN, LDN Clinical Nutrition

## 2023-06-15 NOTE — Progress Notes (Signed)
Speech Language Pathology  Patient Details Name: RENSO SWETT MRN: [id number] DOB: 1990-10-16 Today's Date: 06/15/2023 Time:  -     Received return call from, Kayla, pt's legal guardian who reports that PEG was placed initially following TBI in Sept due to decreased responsiveness and inability to consume po's. An MBS will be performed today at 12:30.                             Royce Macadamia  06/15/2023, 12:08 PM

## 2023-06-15 NOTE — Progress Notes (Signed)
PROGRESS NOTE  [name] WJX:[id number] DOB: [birth year] DOA: 06/13/2023 PCP: Patient, No Pcp Per   LOS: 2 days   Brief narrative:  Alexander Fritz is a 33 y.o. male with medical history significant of TBI, autism, bipolar disorder, and tobacco abuse who presented to hospital with complaints of nausea, vomiting, and diarrhea at the nursing facility.  Patient just recently hospitalized on 05/16/23-05/18/2023 with a catheter associated urinary tract infection that grew out Proteus mirabilis from urine culture performed on 12/25.  However repeat cultures from 05/21/23 were noted to show Enterococcus faecalis.  In the ED, patient was febrile up to 102.8 F with tachycardia, tachypnea with labs concerning for leukocytosis at 14.2, AST 46, ALT 120. Chest x-ray without infiltrate.   Influenza, COVID-19, and RSV screening were negative.  Urinalysis significant for moderate hemoglobin, moderate leukocytes, positive nitrites, many bacteria, greater than 50 RBC/hpf, and greater than 50 WBCs.  Blood and urine cultures were obtained, patient received acetaminophen 1000 mg p.o., 1 L normal saline IV fluids, Zofran IV, and 2 g of Rocephin IV was considered for admission to the hospital for further evaluation and treatment.      Assessment/Plan: Principal Problem:   Sepsis (HCC) Active Problems:   Catheter-associated urinary tract infection (HCC)   Nausea, vomiting, and diarrhea   Autism spectrum disorder   Bipolar 1 disorder (HCC)   History of traumatic brain injury   S/P percutaneous endoscopic gastrostomy (PEG) tube placement (HCC)  Sepsis secondary to catheter associated urinary tract infection  Recently hospitalized with a Proteus mirabilis UTI and has a chronic indwelling catheter in place.  Another set of urine cultures from 05/21/2023 also noted Enterococcus faecalis.  Currently receiving IV Rocephin.  Urine culture showing significant colonies of MRSA at this time.  Foley catheter has been exchanged  this admission.   Blood cultures negative in 1 day.  Exchange of Foley catheter.  WBC today at 4.0.  Will communicate with infectious disease regarding MRSA in urine.  Chronic indwelling Foley catheter.  Was supposed to follow-up with urology as outpatient to address Foley catheter issues.  Discussed with the legal guardian about it.  Encouraged to keep up that appointment with urology.  Nausea, vomiting, and diarrhea Secondary to UTI.  Has gastrostomy tube.  Abdominal x-ray showed diffuse gas-filled nondilated loops of bowel.  Continue with PEG tube feeding with water flushes.  No further episodes of nausea vomiting and diarrhea reported.   Autism/Bipolar disorder/History of TBI On Depakote.  Depakote level was 39 better than level 1 months back.  Dysphagia Status post PEG Abdominal x-ray without any obstruction.  Continue tube feeding.  Seen by speech therapy and recommend ice chips after oral care.  Will continue tube feeding.     Transaminitis Acute on chronic.  AST 46 and ALT 120, but had been seen elevated similarly in the past.  Hepatitis panel recently done was negative.  Will check LFTs in AM.  DVT prophylaxis: enoxaparin (LOVENOX) injection 40 mg Start: 06/13/23 0945   Disposition: SNF  Status is: Inpatient  Remains inpatient appropriate because: sepsis, IV antibiotics    Code Status:     Code Status: Full Code  Family Communication: I spoke with the legal guardian on the phone on 06/14/2023  Consultants: Phone consultation with ID.  Procedures: None  Anti-infectives:  Rocephin IV.  Anti-infectives (From admission, onward)    Start     Dose/Rate Route Frequency Ordered Stop   06/14/23 0800  cefTRIAXone (ROCEPHIN) 2 g in  sodium chloride 0.9 % 100 mL IVPB        2 g 200 mL/hr over 30 Minutes Intravenous Every 24 hours 06/13/23 1015     06/13/23 0815  cefTRIAXone (ROCEPHIN) 2 g in sodium chloride 0.9 % 100 mL IVPB        2 g 200 mL/hr over 30 Minutes Intravenous   Once 06/13/23 0805 06/13/23 0925        Subjective: Today, patient was seen and examined at bedside.  Poor historian.  Patient denies any nausea, vomiting.  Unclear whether he has pain.  No fever reported today.  Objective: Vitals:   06/15/23 0400 06/15/23 0749  BP: (!) 91/50 101/63  Pulse: 77 86  Resp: 18 18  Temp: 98.6 F (37 C) 98.6 F (37 C)  SpO2: 98% 98%    Intake/Output Summary (Last 24 hours) at 06/15/2023 1032 Last data filed at 06/15/2023 0407 Gross per 24 hour  Intake --  Output 2350 ml  Net -2350 ml   Filed Weights   06/14/23 0500 06/15/23 0406  Weight: 70.9 kg 66.9 kg   Body mass index is 20 kg/m.   Physical Exam:  GENERAL: Patient is alert awake, mildly Communicative, cognitively impaired does not appear to be in obvious distress. HENT: No scleral pallor or icterus. Pupils equally reactive to light. Oral mucosa is moist NECK: is supple, no gross swelling noted. CHEST: Clear to auscultation. No crackles or wheezes.   CVS: S1 and S2 heard, no murmur. Regular rate and rhythm.  ABDOMEN: Soft, non-tender, bowel sounds are present.  Foley catheter in place. EXTREMITIES: No edema. CNS:   Moving extremities. SKIN: warm and dry without rashes.  Data Review: I have personally reviewed the following laboratory data and studies,  CBC: Recent Labs  Lab 06/13/23 0612 06/14/23 0703 06/15/23 0707  WBC 14.2* 3.3* 4.0  NEUTROABS 13.3*  --   --   HGB 16.1 13.0 13.5  HCT 47.9 38.3* 39.8  MCV 85.2 83.6 84.1  PLT 372 278 306   Basic Metabolic Panel: Recent Labs  Lab 06/13/23 0612 06/14/23 0703 06/15/23 0707  NA 135 134* 138  K 4.4 3.7 3.9  CL 101 99 103  CO2 21* 26 26  GLUCOSE 110* 94 116*  BUN 11 6 8   CREATININE 0.62 0.53* 0.52*  CALCIUM 9.6 8.4* 8.7*  MG  --   --  1.9   Liver Function Tests: Recent Labs  Lab 06/13/23 0612  AST 46*  ALT 120*  ALKPHOS 123  BILITOT 0.4  PROT 7.8  ALBUMIN 3.9   No results for input(s): "LIPASE", "AMYLASE"  in the last 168 hours. No results for input(s): "AMMONIA" in the last 168 hours. Cardiac Enzymes: No results for input(s): "CKTOTAL", "CKMB", "CKMBINDEX", "TROPONINI" in the last 168 hours. BNP (last 3 results) No results for input(s): "BNP" in the last 8760 hours.  ProBNP (last 3 results) No results for input(s): "PROBNP" in the last 8760 hours.  CBG: Recent Labs  Lab 06/14/23 1550 06/14/23 2121 06/15/23 0000 06/15/23 0350 06/15/23 0749  GLUCAP 108* 88 101* 121* 87   Recent Results (from the past 240 hours)  Resp panel by RT-PCR (RSV, Flu A&B, Covid) Anterior Nasal Swab     Status: None   Collection Time: 06/13/23  6:13 AM   Specimen: Anterior Nasal Swab  Result Value Ref Range Status   SARS Coronavirus 2 by RT PCR NEGATIVE NEGATIVE Final   Influenza A by PCR NEGATIVE NEGATIVE Final  Influenza B by PCR NEGATIVE NEGATIVE Final    Comment: (NOTE) The Xpert Xpress SARS-CoV-2/FLU/RSV plus assay is intended as an aid in the diagnosis of influenza from Nasopharyngeal swab specimens and should not be used as a sole basis for treatment. Nasal washings and aspirates are unacceptable for Xpert Xpress SARS-CoV-2/FLU/RSV testing.  Fact Sheet for Patients: BloggerCourse.com  Fact Sheet for Healthcare Providers: SeriousBroker.it  This test is not yet approved or cleared by the [place] FDA and has been authorized for detection and/or diagnosis of SARS-CoV-2 by FDA under an Emergency Use Authorization (EUA). This EUA will remain in effect (meaning this test can be used) for the duration of the COVID-19 declaration under Section 564(b)(1) of the Act, 21 U.S.C. section 360bbb-3(b)(1), unless the authorization is terminated or revoked.     Resp Syncytial Virus by PCR NEGATIVE NEGATIVE Final    Comment: (NOTE) Fact Sheet for Patients: BloggerCourse.com  Fact Sheet for Healthcare  Providers: SeriousBroker.it  This test is not yet approved or cleared by the [place] FDA and has been authorized for detection and/or diagnosis of SARS-CoV-2 by FDA under an Emergency Use Authorization (EUA). This EUA will remain in effect (meaning this test can be used) for the duration of the COVID-19 declaration under Section 564(b)(1) of the Act, 21 U.S.C. section 360bbb-3(b)(1), unless the authorization is terminated or revoked.  Performed at [facility], [address] [place], [place] [address]   Blood Culture (routine x 2)     Status: None (Preliminary result)   Collection Time: 06/13/23  6:41 AM   Specimen: BLOOD LEFT HAND  Result Value Ref Range Status   Specimen Description BLOOD LEFT HAND  Final   Special Requests   Final    BOTTLES DRAWN AEROBIC AND ANAEROBIC Blood Culture results may not be optimal due to an inadequate volume of blood received in culture bottles   Culture   Final    NO GROWTH 1 DAY Performed at [facility], [address], [place], [place] [address]    Report Status PENDING  Incomplete  Urine Culture     Status: Abnormal   Collection Time: 06/13/23  6:41 AM   Specimen: Urine, Random  Result Value Ref Range Status   Specimen Description URINE, RANDOM  Final   Special Requests   Final    URINE, CLEAN CATCH Performed at [facility], [address] [place], [place] [address]    Culture (A)  Final    >=100,000 COLONIES/mL METHICILLIN RESISTANT STAPHYLOCOCCUS AUREUS   Report Status 06/15/2023 FINAL  Final   Organism ID, Bacteria METHICILLIN RESISTANT STAPHYLOCOCCUS AUREUS (A)  Final      Susceptibility   Methicillin resistant staphylococcus aureus - MIC*    CIPROFLOXACIN >=8 RESISTANT Resistant     GENTAMICIN <=0.5 SENSITIVE Sensitive     NITROFURANTOIN <=16 SENSITIVE Sensitive     OXACILLIN >=4 RESISTANT Resistant     TETRACYCLINE >=16 RESISTANT Resistant     VANCOMYCIN 1 SENSITIVE Sensitive      TRIMETH/SULFA <=10 SENSITIVE Sensitive     RIFAMPIN <=0.5 SENSITIVE Sensitive     Inducible Clindamycin NEGATIVE Sensitive     LINEZOLID 2 SENSITIVE Sensitive     * >=100,000 COLONIES/mL METHICILLIN RESISTANT STAPHYLOCOCCUS AUREUS  Blood Culture (routine x 2)     Status: None (Preliminary result)   Collection Time: 06/13/23  8:15 AM   Specimen: BLOOD RIGHT HAND  Result Value Ref Range Status   Specimen Description BLOOD  RIGHT HAND  Final   Special Requests   Final    AEROBIC BOTTLE ONLY Blood Culture results may not be optimal due to an inadequate volume of blood received in culture bottles   Culture   Final    NO GROWTH 1 DAY Performed at [facility], [address], [place], [place] [address]    Report Status PENDING  Incomplete     Studies: DG Abd Portable 1V Result Date: 06/13/2023 CLINICAL DATA:  Nausea and vomiting EXAM: PORTABLE ABDOMEN - 1 VIEW COMPARISON:  Abdomen and pelvis dated 05/21/2023 FINDINGS: Percutaneous gastrostomy tube projects over the left upper quadrant. Diffuse gas-filled nondilated loops of bowel throughout the abdomen. No acute osseous abnormalities. No radiopaque calculi. IMPRESSION: Diffuse gas-filled nondilated loops of bowel throughout the abdomen, nonspecific. Electronically Signed   By: Agustin Cree M.D.   On: 06/13/2023 14:07      Joycelyn Das, MD  [facility] 06/15/2023  If 7PM-7AM, please contact night-coverage

## 2023-06-16 ENCOUNTER — Other Ambulatory Visit (HOSPITAL_COMMUNITY): Payer: Self-pay

## 2023-06-16 DIAGNOSIS — A419 Sepsis, unspecified organism: Secondary | ICD-10-CM | POA: Diagnosis not present

## 2023-06-16 LAB — COMPREHENSIVE METABOLIC PANEL
ALT: 44 U/L (ref 0–44)
AST: 25 U/L (ref 15–41)
Albumin: 3.1 g/dL — ABNORMAL LOW (ref 3.5–5.0)
Alkaline Phosphatase: 99 U/L (ref 38–126)
Anion gap: 15 (ref 5–15)
BUN: 10 mg/dL (ref 6–20)
CO2: 24 mmol/L (ref 22–32)
Calcium: 9.1 mg/dL (ref 8.9–10.3)
Chloride: 100 mmol/L (ref 98–111)
Creatinine, Ser: 0.72 mg/dL (ref 0.61–1.24)
GFR, Estimated: >60 mL/min (ref >60)
Glucose, Bld: 86 mg/dL (ref 70–99)
Potassium: 4.2 mmol/L (ref 3.5–5.1)
Sodium: 139 mmol/L (ref 135–145)
Total Bilirubin: 0.3 mg/dL (ref 0.0–1.2)
Total Protein: 6.6 g/dL (ref 6.5–8.1)

## 2023-06-16 LAB — GLUCOSE, CAPILLARY
Glucose-Capillary: 92 mg/dL (ref 70–99)
Glucose-Capillary: 98 mg/dL (ref 70–99)
Glucose-Capillary: 99 mg/dL (ref 70–99)
Glucose-Capillary: 100 mg/dL — ABNORMAL HIGH (ref 70–99)

## 2023-06-16 LAB — CBC
HCT: 39.9 % (ref 39.0–52.0)
Hemoglobin: 13.3 g/dL (ref 13.0–17.0)
MCH: 27.8 pg (ref 26.0–34.0)
MCHC: 33.3 g/dL (ref 30.0–36.0)
MCV: 83.3 fL (ref 80.0–100.0)
Platelets: 340 10*3/uL (ref 150–400)
RBC: 4.79 MIL/uL (ref 4.22–5.81)
RDW: 12.1 % (ref 11.5–15.5)
WBC: 3.8 10*3/uL — ABNORMAL LOW (ref 4.0–10.5)
nRBC: 0 % (ref 0.0–0.2)

## 2023-06-16 MED ORDER — ACETAMINOPHEN 325 MG PO TABS: 650.0000 mg | ORAL_TABLET | Freq: Four times a day (QID) | ORAL | Status: DC | PRN

## 2023-06-16 MED ORDER — CEFPODOXIME PROXETIL 200 MG PO TABS
200.0000 mg | ORAL_TABLET | Freq: Two times a day (BID) | ORAL | 0 refills | Status: AC
  Filled 2023-06-16: qty 10, 5d supply, fill #0

## 2023-06-16 MED ORDER — CEFPODOXIME PROXETIL 200 MG PO TABS: 200.0000 mg | ORAL_TABLET | Freq: Two times a day (BID) | ORAL | Status: DC

## 2023-06-16 MED ORDER — ACETAMINOPHEN 325 MG PO TABS: 650.0000 mg | ORAL_TABLET | Freq: Four times a day (QID) | ORAL | Status: AC | PRN

## 2023-06-16 NOTE — Progress Notes (Signed)
Speech Language Pathology Treatment: Dysphagia  Patient Details Name: Alexander Fritz MRN: [id number] DOB: [birth year] Today's Date: 06/16/2023 Time: 1115-1130 SLP Time Calculation (min) (ACUTE ONLY): 15 min  Assessment / Plan / Recommendation Clinical Impression  Pt seen at bedside for assessment of diet tolerance and education re: safe swallow precautions. Pt was observed with regular and soft solids, and thin liquids via straw. Pt slightly impulsive with drinking. Self fed solids without overt difficulty. No overt s/s aspiration observed following PO trials. Pt was encouraged to limit bite size and rate to maximize safety. Pt slated for discharge today. No further ST intervention recommended at this time. Please reconsult if needs arise.   HPI HPI: Alexander Fritz is a 33 y.o. male with medical history significant of TBI, autism, bipolar disorder, currently has PEG and tobacco abuse who presented to hospital with complaints of nausea, vomiting, and diarrhea at the nursing facility.  Patient just recently hospitalized on 05/16/23-05/18/2023 with a catheter associated urinary tract infection cultures 12/30 were noted to show Enterococcus faecalis.  In the ED, patient was febrile up to 102.8 F with tachycardia, tachypnea with labs concerning for leukocytosis at 14.2, AST 46, ALT 120. Chest x-ray without infiltrate.  Per MD note "legal guardian was not sure whether pt could handle po but he was okay with ice chips."      SLP Plan  Discharge SLP treatment due to goals met      Recommendations for follow up therapy are one component of a multi-disciplinary discharge planning process, led by the attending physician.  Recommendations may be updated based on patient status, additional functional criteria and insurance authorization.    Recommendations  Diet recommendations: Regular;Thin liquid Medication Administration: Via alternative means Supervision: Patient able to self feed Compensations: Minimize  environmental distractions;Slow rate;Small sips/bites Postural Changes and/or Swallow Maneuvers: Seated upright 90 degrees;Upright 30-60 min after meal                  Oral care QID   Frequent or constant Supervision/Assistance Dysphagia, unspecified (R13.10)     All goals met;Discharge SLP treatment due to (comment)    London Tarnowski B. Murvin Natal, [facility], CCC-SLP Speech Language Pathologist Office: [phone]  Leigh Aurora 06/16/2023, 11:33 AM

## 2023-06-16 NOTE — TOC Transition Note (Signed)
Transition of Care [name]) - Discharge Note   Patient Details  Name: Alexander Fritz MRN: [id number] Date of Birth: [birth year]  Transition of Care [name]) CM/SW Contact:  Alexander Fritz, [place] Phone Number: 06/16/2023, 11:30 AM   Clinical Narrative: Pt for dc back to [name] today where he is a LTC resident. Spoke to [place] at [facility] who confirmed they are prepared to admit pt to room 138B. Voicemail left for pt's guardian Alexander Fritz [phone] and notified on-call guardian for [name], Alexander Fritz [phone], who reports agreeable to dc plan. RN provided with number for report and PTAR arranged for transport. SW signing off at dc.   Alexander Fritz, MSW, LCSW [phone] (coverage)        Final next level of care: Skilled Nursing Facility Barriers to Discharge: Barriers Resolved   Patient Goals and CMS Choice Patient states their goals for this hospitalization and ongoing recovery are:: patient unable to participate in goal setting, not fully oriented CMS Medicare.gov Compare Post Acute Care list provided to:: Legal Guardian Choice offered to / list presented to : [facility] POA / Guardian [employer] ownership interest in [facility].provided to:: [facility] POA / Guardian    Discharge Placement              Patient chooses bed at: Other - please specify in the comment section below: [name]) Patient to be transferred to facility by: PTAR Name of family member notified: Alexander Fritz/on-call for [name] Guardianship Patient and family notified of of transfer: 06/16/23  Discharge Plan and Services Additional resources added to the After Visit Summary for       Post Acute Care Choice: Skilled Nursing Facility                               Social Drivers of Health (SDOH) Interventions SDOH Screenings   Food Insecurity: No Food Insecurity (06/14/2023)  Housing: Low Risk  (06/14/2023)  Transportation Needs: No Transportation Needs (06/14/2023)  Utilities:  Not At Risk (06/14/2023)  Tobacco Use: High Risk (06/13/2023)     Readmission Risk Interventions     No data to display

## 2023-06-16 NOTE — Discharge Summary (Signed)
Physician Discharge Summary  Alexander Fritz ZOX:[id number] DOB: 12/13/1990 DOA: 06/13/2023  PCP: Patient, No Pcp Per  Admit date: 06/13/2023 Discharge date: 06/16/2023  Admitted From: Long-term care care facility.  Discharge disposition: Long-term care facility.  Recommendations for Outpatient Follow-Up:   Follow up with your primary care provider at the long-term care facility in 3 to 5 days Check CBC, BMP, magnesium in the next visit Complete the course of antibiotic. Follow-up with urologist as outpatient for Foley catheter management. Patient has been initiated on oral diet while in the hospital.  Follow-up with speech therapy in the facility to advance diet as tolerated.   Discharge Diagnosis:   Principal Problem:   Sepsis (HCC) Active Problems:   Catheter-associated urinary tract infection (HCC)   Nausea, vomiting, and diarrhea   Autism spectrum disorder   Bipolar 1 disorder (HCC)   History of traumatic brain injury   S/P percutaneous endoscopic gastrostomy (PEG) tube placement [facility])   Discharge Condition: Improved.  Diet recommendation: Has been started on regular diet while in the hospital.  Advance as tolerated in the facility.  Wound care: None.  Code status: Full.   History of Present Illness:   Alexander Fritz is a 33 y.o. male with medical history significant of TBI, autism, bipolar disorder, and tobacco abuse who presented to hospital with complaints of nausea, vomiting, and diarrhea at the nursing facility.  Patient just recently hospitalized on 05/16/23-05/18/2023 with a catheter associated urinary tract infection that grew out Proteus mirabilis from urine culture performed on 12/25.  However repeat cultures from 05/21/23 were noted to show Enterococcus faecalis.  In the ED, patient was febrile up to 102.8 F with tachycardia, tachypnea with labs concerning for leukocytosis at 14.2, AST 46, ALT 120. Chest x-ray without infiltrate.   Influenza, COVID-19, and RSV  screening were negative.  Urinalysis significant for moderate hemoglobin, moderate leukocytes, positive nitrites, many bacteria, greater than 50 RBC/hpf, and greater than 50 WBCs.  Blood and urine cultures were obtained, patient received acetaminophen 1000 mg p.o., 1 L normal saline IV fluids, Zofran IV, and 2 g of Rocephin IV was considered for admission to the hospital for further evaluation and treatment.   Hospital Course:   Following conditions were addressed during hospitalization as listed below,  Sepsis secondary to catheter associated urinary tract infection  Recently hospitalized with a Proteus mirabilis UTI and has a chronic indwelling catheter in place.  Another set of urine cultures from 05/21/2023 also noted Enterococcus faecalis.  Patient received IV Rocephin during hospitalization with improvement in his symptoms..  Urine culture showing significant colonies of MRSA at this time.  Communicated infectious disease in likely to be a colonization from catheter and not to be treated.  Foley catheter has been exchanged this admission.   Blood cultures negative in 3 day.  Patient is afebrile without any leukocytosis and has clinically improved.  Will continue Vantin on discharge to complete total 7-day course of antibiotic.   Chronic indwelling Foley catheter.  Was supposed to follow-up with urology as outpatient to address Foley catheter issues.  Discussed with the legal guardian about it.  Encouraged to keep up that appointment with urology.   Nausea, vomiting, and diarrhea Resolved.  Secondary to UTI.  Has gastrostomy tube.  Abdominal x-ray showed diffuse gas-filled nondilated loops of bowel.  Continue with PEG tube feeding with water flushes.  No further episodes of nausea vomiting and diarrhea reported.   Autism/Bipolar disorder/History of TBI On Depakote.  Depakote level  was 39 better than level 1 months back.  Continue on discharge.   Dysphagia Status post PEG Abdominal x-ray  without any obstruction.  Continue tube feeding.  Seen by speech therapy and recommen regular diet at this time.  Will continue tube feeding.     Transaminitis Acute on chronic.  AST 46 and ALT 120, but had been seen elevated similarly in the past.  Hepatitis panel recently done was negative.   Disposition.  At this time, patient is stable for disposition back to long-term care.  Left voice message for the patient's legal guardian.  Medical Consultants:   Verbal consult with infectious disease.  Procedures:    Exchange of Foley catheter Subjective:   Today, patient was seen and examined at bedside.  Denies any nausea, vomiting.  No fever noted.  Discharge Exam:   Vitals:   06/16/23 0416 06/16/23 0800  BP: (!) 92/58 98/64  Pulse: 77 72  Resp: 16 18  Temp: (!) 97.5 F (36.4 C) (!) 97.5 F (36.4 C)  SpO2: 99% 98%   Vitals:   06/15/23 2011 06/16/23 0416 06/16/23 0732 06/16/23 0800  BP: 104/70 (!) 92/58  98/64  Pulse: 91 77  72  Resp: 18 16  18   Temp: 98.6 F (37 C) (!) 97.5 F (36.4 C)  (!) 97.5 F (36.4 C)  TempSrc: Oral Oral  Oral  SpO2: 99% 99%  98%  Weight:   65.7 kg   Height:       Body mass index is 19.64 kg/m.   General: Alert awake, not in obvious distress, average build.  Cognitively impaired, HENT: pupils equally reacting to light,  No scleral pallor or icterus noted. Oral mucosa is moist.  Chest:  Clear breath sounds.  No crackles or wheezes.  CVS: S1 &S2 heard. No murmur.  Regular rate and rhythm. Abdomen: Soft, nontender, nondistended.  Bowel sounds are heard.  Foley catheter in place Extremities: No cyanosis, clubbing or edema.  Peripheral pulses are palpable. Psych: Alert, awake, Communicative. CNS: Moves extremities. Skin: Warm and dry.  No rashes noted.  The results of significant diagnostics from this hospitalization (including imaging, microbiology, ancillary and laboratory) are listed below for reference.     Diagnostic Studies:   DG Abd  Portable 1V Result Date: 06/13/2023 CLINICAL DATA:  Nausea and vomiting EXAM: PORTABLE ABDOMEN - 1 VIEW COMPARISON:  Abdomen and pelvis dated 05/21/2023 FINDINGS: Percutaneous gastrostomy tube projects over the left upper quadrant. Diffuse gas-filled nondilated loops of bowel throughout the abdomen. No acute osseous abnormalities. No radiopaque calculi. IMPRESSION: Diffuse gas-filled nondilated loops of bowel throughout the abdomen, nonspecific. Electronically Signed   By: Agustin Cree M.D.   On: 06/13/2023 14:07   DG Chest Port 1 View Result Date: 06/13/2023 CLINICAL DATA:  33 year old male with possible sepsis. Fever and vomiting. EXAM: PORTABLE CHEST 1 VIEW COMPARISON:  Portable chest 01/30/2023 and earlier. FINDINGS: Portable AP semi upright view at 0626 hours. Tracheostomy has been removed. Improved lung volumes. Normal cardiac size and mediastinal contours. Allowing for portable technique the lungs are clear. No pneumothorax or pleural effusion. Negative visible bowel gas and osseous structures. IMPRESSION: Negative portable chest. Electronically Signed   By: Odessa Fleming M.D.   On: 06/13/2023 06:34     Labs:   Basic Metabolic Panel: Recent Labs  Lab 06/13/23 0612 06/14/23 0703 06/15/23 0707  NA 135 134* 138  K 4.4 3.7 3.9  CL 101 99 103  CO2 21* 26 26  GLUCOSE 110* 94 116*  BUN 11 6 8   CREATININE 0.62 0.53* 0.52*  CALCIUM 9.6 8.4* 8.7*  MG  --   --  1.9   GFR Estimated Creatinine Clearance: 125.5 mL/min (A) (by C-G formula based on SCr of 0.52 mg/dL (L)). Liver Function Tests: Recent Labs  Lab 06/13/23 0612  AST 46*  ALT 120*  ALKPHOS 123  BILITOT 0.4  PROT 7.8  ALBUMIN 3.9   No results for input(s): "LIPASE", "AMYLASE" in the last 168 hours. No results for input(s): "AMMONIA" in the last 168 hours. Coagulation profile Recent Labs  Lab 06/13/23 0612  INR 1.1    CBC: Recent Labs  Lab 06/13/23 0612 06/14/23 0703 06/15/23 0707  WBC 14.2* 3.3* 4.0  NEUTROABS 13.3*   --   --   HGB 16.1 13.0 13.5  HCT 47.9 38.3* 39.8  MCV 85.2 83.6 84.1  PLT 372 278 306   Cardiac Enzymes: No results for input(s): "CKTOTAL", "CKMB", "CKMBINDEX", "TROPONINI" in the last 168 hours. BNP: Invalid input(s): "POCBNP" CBG: Recent Labs  Lab 06/15/23 1157 06/15/23 1627 06/16/23 0124 06/16/23 0527 06/16/23 0758  GLUCAP 87 99 92 98 99   D-Dimer No results for input(s): "DDIMER" in the last 72 hours. Hgb A1c No results for input(s): "HGBA1C" in the last 72 hours. Lipid Profile No results for input(s): "CHOL", "HDL", "LDLCALC", "TRIG", "CHOLHDL", "LDLDIRECT" in the last 72 hours. Thyroid function studies No results for input(s): "TSH", "T4TOTAL", "T3FREE", "THYROIDAB" in the last 72 hours.  Invalid input(s): "FREET3" Anemia work up No results for input(s): "VITAMINB12", "FOLATE", "FERRITIN", "TIBC", "IRON", "RETICCTPCT" in the last 72 hours. Microbiology Recent Results (from the past 240 hours)  Resp panel by RT-PCR (RSV, Flu A&B, Covid) Anterior Nasal Swab     Status: None   Collection Time: 06/13/23  6:13 AM   Specimen: Anterior Nasal Swab  Result Value Ref Range Status   SARS Coronavirus 2 by RT PCR NEGATIVE NEGATIVE Final   Influenza A by PCR NEGATIVE NEGATIVE Final   Influenza B by PCR NEGATIVE NEGATIVE Final    Comment: (NOTE) The Xpert Xpress SARS-CoV-2/FLU/RSV plus assay is intended as an aid in the diagnosis of influenza from Nasopharyngeal swab specimens and should not be used as a sole basis for treatment. Nasal washings and aspirates are unacceptable for Xpert Xpress SARS-CoV-2/FLU/RSV testing.  Fact Sheet for Patients: BloggerCourse.com  Fact Sheet for Healthcare Providers: SeriousBroker.it  This test is not yet approved or cleared by the [place] FDA and has been authorized for detection and/or diagnosis of SARS-CoV-2 by FDA under an Emergency Use Authorization (EUA). This EUA will  remain in effect (meaning this test can be used) for the duration of the COVID-19 declaration under Section 564(b)(1) of the Act, 21 U.S.C. section 360bbb-3(b)(1), unless the authorization is terminated or revoked.     Resp Syncytial Virus by PCR NEGATIVE NEGATIVE Final    Comment: (NOTE) Fact Sheet for Patients: BloggerCourse.com  Fact Sheet for Healthcare Providers: SeriousBroker.it  This test is not yet approved or cleared by the [place] FDA and has been authorized for detection and/or diagnosis of SARS-CoV-2 by FDA under an Emergency Use Authorization (EUA). This EUA will remain in effect (meaning this test can be used) for the duration of the COVID-19 declaration under Section 564(b)(1) of the Act, 21 U.S.C. section 360bbb-3(b)(1), unless the authorization is terminated or revoked.  Performed at [facility], [address], [place], [place] [address]   Blood Culture (routine x 2)  Status: None (Preliminary result)   Collection Time: 06/13/23  6:41 AM   Specimen: BLOOD LEFT HAND  Result Value Ref Range Status   Specimen Description BLOOD LEFT HAND  Final   Special Requests   Final    BOTTLES DRAWN AEROBIC AND ANAEROBIC Blood Culture results may not be optimal due to an inadequate volume of blood received in culture bottles   Culture   Final    NO GROWTH 3 DAYS Performed at [facility], [address], [place], [place] [address]    Report Status PENDING  Incomplete  Urine Culture     Status: Abnormal   Collection Time: 06/13/23  6:41 AM   Specimen: Urine, Random  Result Value Ref Range Status   Specimen Description URINE, RANDOM  Final   Special Requests   Final    URINE, CLEAN CATCH Performed at [facility], [address] [place], [place] [address]    Culture (A)  Final    >=100,000 COLONIES/mL METHICILLIN RESISTANT STAPHYLOCOCCUS AUREUS   Report Status 06/15/2023 FINAL  Final    Organism ID, Bacteria METHICILLIN RESISTANT STAPHYLOCOCCUS AUREUS (A)  Final      Susceptibility   Methicillin resistant staphylococcus aureus - MIC*    CIPROFLOXACIN >=8 RESISTANT Resistant     GENTAMICIN <=0.5 SENSITIVE Sensitive     NITROFURANTOIN <=16 SENSITIVE Sensitive     OXACILLIN >=4 RESISTANT Resistant     TETRACYCLINE >=16 RESISTANT Resistant     VANCOMYCIN 1 SENSITIVE Sensitive     TRIMETH/SULFA <=10 SENSITIVE Sensitive     RIFAMPIN <=0.5 SENSITIVE Sensitive     Inducible Clindamycin NEGATIVE Sensitive     LINEZOLID 2 SENSITIVE Sensitive     * >=100,000 COLONIES/mL METHICILLIN RESISTANT STAPHYLOCOCCUS AUREUS  Blood Culture (routine x 2)     Status: None (Preliminary result)   Collection Time: 06/13/23  8:15 AM   Specimen: BLOOD RIGHT HAND  Result Value Ref Range Status   Specimen Description BLOOD RIGHT HAND  Final   Special Requests   Final    AEROBIC BOTTLE ONLY Blood Culture results may not be optimal due to an inadequate volume of blood received in culture bottles   Culture   Final    NO GROWTH 3 DAYS Performed at [facility], [address], [place], [place] [address]    Report Status PENDING  Incomplete     Discharge Instructions:   Discharge Instructions     Call MD for:  persistant nausea and vomiting   Complete by: As directed    Call MD for:  severe uncontrolled pain   Complete by: As directed    Call MD for:  temperature >100.4   Complete by: As directed    Diet general   Complete by: As directed    Discharge instructions   Complete by: As directed    Follow-up with your primary care provider in 3 to 5 days.  Check blood work at that time.  Complete the  course of antibiotic.  Seek medical attention for worsening symptoms.   Discharge wound care:   Complete by: As directed    Pressure ulcer prevention protocol   Increase activity slowly   Complete by: As directed       Allergies as of 06/16/2023       Reactions   Cefaclor Other (See  Comments)   Unknown.  Per group home MAR.   Egg-derived Products    Unknown rxn. Per MAR.  Lithium Palpitations, Rash        Medication List     TAKE these medications    acetaminophen 325 MG tablet Commonly known as: TYLENOL Take 2 tablets (650 mg total) by mouth every 6 (six) hours as needed for mild pain (pain score 1-3) (or Fever >/= 101).   bethanechol 25 MG tablet Commonly known as: URECHOLINE Place 1 tablet (25 mg total) into feeding tube 3 (three) times daily.   cefpodoxime 200 MG tablet Commonly known as: VANTIN Take 1 tablet (200 mg total) by mouth 2 (two) times daily for 5 days.   dicyclomine 10 MG capsule Commonly known as: BENTYL Place 10 mg into feeding tube every 8 (eight) hours as needed for spasms (abd pain).   docusate 50 MG/5ML liquid Commonly known as: COLACE Place 10 mLs (100 mg total) into feeding tube daily as needed for mild constipation.   feeding supplement (OSMOLITE 1.5 CAL) Liqd Place 1,000 mLs into feeding tube continuous. 35ml/hr   free water Soln Place 150 mLs into feeding tube every 4 (four) hours. Flush tube feeding tube   hydrOXYzine 25 MG tablet Commonly known as: ATARAX Place 50 mg into feeding tube every 8 (eight) hours as needed for anxiety.   LORazepam 2 MG/ML concentrated solution Commonly known as: ATIVAN Place 0.25 mLs into feeding tube every 6 (six) hours as needed for anxiety. 0.5mg  = 0.43ml   oxybutynin 5 MG 24 hr tablet Commonly known as: DITROPAN-XL Take 5 mg by mouth at bedtime. GIVE 1 TABLET PER G TUBE EVERY 8 HOURS AS NEEDED FOR BLADDER / SPASMS   oxycodone 5 MG capsule Commonly known as: OXY-IR Place 5 mg into feeding tube every 6 (six) hours as needed for pain.   polycarbophil 625 MG tablet Commonly known as: FIBERCON Place 1 tablet (625 mg total) into feeding tube as needed for diarrhea or loose stools.   polyethylene glycol 17 g packet Commonly known as: MIRALAX / GLYCOLAX Place 17 g into feeding tube  daily as needed (constipation).   risperiDONE 2 MG tablet Commonly known as: RISPERDAL Place 1 tablet (2 mg total) into feeding tube daily.   trihexyphenidyl 5 MG tablet Commonly known as: ARTANE Place 1 tablet (5 mg total) into feeding tube 2 (two) times daily.   valproic acid 250 MG/5ML solution Commonly known as: DEPAKENE Place 10 mLs (500 mg total) into feeding tube every 8 (eight) hours.               Discharge Care Instructions  (From admission, onward)           Start     Ordered   06/16/23 0000  Discharge wound care:       Comments: Pressure ulcer prevention protocol   06/16/23 0934            Contact information for after-discharge care     Destination     [facility] .   Service: Skilled Nursing Contact information: [address] [place] [address] [phone]                      Time coordinating discharge: 39 minutes  Signed:  Ornella Coderre  [facility] 06/16/2023, 9:42 AM

## 2023-06-18 LAB — CULTURE, BLOOD (ROUTINE X 2)
Culture: NO GROWTH
Culture: NO GROWTH

## 2023-06-29 ENCOUNTER — Other Ambulatory Visit (HOSPITAL_COMMUNITY): Payer: Self-pay

## 2023-07-15 ENCOUNTER — Emergency Department (HOSPITAL_COMMUNITY): Payer: 59

## 2023-07-15 ENCOUNTER — Emergency Department (HOSPITAL_COMMUNITY)
Admission: EM | Admit: 2023-07-15 | Discharge: 2023-07-15 | Disposition: A | Discharge status: Home / Self Care | Payer: 59 | Attending: Emergency Medicine | Admitting: Emergency Medicine

## 2023-07-15 ENCOUNTER — Other Ambulatory Visit: Payer: Self-pay

## 2023-07-15 ENCOUNTER — Encounter (HOSPITAL_COMMUNITY): Payer: Self-pay | Admitting: Emergency Medicine

## 2023-07-15 DIAGNOSIS — K5900 Constipation, unspecified: Secondary | ICD-10-CM | POA: Insufficient documentation

## 2023-07-15 DIAGNOSIS — Z931 Gastrostomy status: Secondary | ICD-10-CM | POA: Diagnosis not present

## 2023-07-15 DIAGNOSIS — R339 Retention of urine, unspecified: Secondary | ICD-10-CM | POA: Insufficient documentation

## 2023-07-15 DIAGNOSIS — F840 Autistic disorder: Secondary | ICD-10-CM | POA: Diagnosis not present

## 2023-07-15 DIAGNOSIS — T839XXA Unspecified complication of genitourinary prosthetic device, implant and graft, initial encounter: Secondary | ICD-10-CM | POA: Diagnosis not present

## 2023-07-15 DIAGNOSIS — R109 Unspecified abdominal pain: Secondary | ICD-10-CM | POA: Diagnosis present

## 2023-07-15 LAB — CBC WITH DIFFERENTIAL/PLATELET
Abs Immature Granulocytes: 0.03 10*3/uL (ref 0.00–0.07)
Basophils Absolute: 0 10*3/uL (ref 0.0–0.1)
Basophils Relative: 0 %
Eosinophils Absolute: 0.1 10*3/uL (ref 0.0–0.5)
Eosinophils Relative: 1 %
HCT: 40.2 % (ref 39.0–52.0)
Hemoglobin: 13 g/dL (ref 13.0–17.0)
Immature Granulocytes: 0 %
Lymphocytes Relative: 29 %
Lymphs Abs: 2.4 10*3/uL (ref 0.7–4.0)
MCH: 28.1 pg (ref 26.0–34.0)
MCHC: 32.3 g/dL (ref 30.0–36.0)
MCV: 86.8 fL (ref 80.0–100.0)
Monocytes Absolute: 0.7 10*3/uL (ref 0.1–1.0)
Monocytes Relative: 8 %
Neutro Abs: 5 10*3/uL (ref 1.7–7.7)
Neutrophils Relative %: 62 %
Platelets: 396 10*3/uL (ref 150–400)
RBC: 4.63 MIL/uL (ref 4.22–5.81)
RDW: 12.6 % (ref 11.5–15.5)
WBC: 8.3 10*3/uL (ref 4.0–10.5)
nRBC: 0 % (ref 0.0–0.2)

## 2023-07-15 LAB — URINALYSIS, ROUTINE W REFLEX MICROSCOPIC
Bacteria, UA: NONE SEEN
Bilirubin Urine: NEGATIVE
Glucose, UA: NEGATIVE mg/dL
Ketones, ur: NEGATIVE mg/dL
Nitrite: NEGATIVE
Protein, ur: NEGATIVE mg/dL
Specific Gravity, Urine: 1.005 (ref 1.005–1.030)
WBC, UA: >50 WBC/hpf (ref 0–5)
pH: 6 (ref 5.0–8.0)

## 2023-07-15 LAB — LIPASE, BLOOD: Lipase: 24 U/L (ref 11–51)

## 2023-07-15 LAB — COMPREHENSIVE METABOLIC PANEL
ALT: 10 U/L (ref 0–44)
AST: 14 U/L — ABNORMAL LOW (ref 15–41)
Albumin: 3.1 g/dL — ABNORMAL LOW (ref 3.5–5.0)
Alkaline Phosphatase: 86 U/L (ref 38–126)
Anion gap: 13 (ref 5–15)
BUN: <5 mg/dL — ABNORMAL LOW (ref 6–20)
CO2: 24 mmol/L (ref 22–32)
Calcium: 9.1 mg/dL (ref 8.9–10.3)
Chloride: 100 mmol/L (ref 98–111)
Creatinine, Ser: 0.69 mg/dL (ref 0.61–1.24)
GFR, Estimated: >60 mL/min (ref >60)
Glucose, Bld: 131 mg/dL — ABNORMAL HIGH (ref 70–99)
Potassium: 3.3 mmol/L — ABNORMAL LOW (ref 3.5–5.1)
Sodium: 137 mmol/L (ref 135–145)
Total Bilirubin: 0.5 mg/dL (ref 0.0–1.2)
Total Protein: 6.3 g/dL — ABNORMAL LOW (ref 6.5–8.1)

## 2023-07-15 LAB — CBG MONITORING, ED: Glucose-Capillary: 98 mg/dL (ref 70–99)

## 2023-07-15 LAB — VALPROIC ACID LEVEL: Valproic Acid Lvl: 67 ug/mL (ref 50.0–100.0)

## 2023-07-15 MED ORDER — LIDOCAINE HCL URETHRAL/MUCOSAL 2 % EX GEL: 1.0000 | Freq: Once | CUTANEOUS | Status: DC

## 2023-07-15 MED ORDER — IOHEXOL 350 MG/ML SOLN
75.0000 mL | Freq: Once | INTRAVENOUS | Status: AC | PRN
  Administered 2023-07-15: 75 mL via INTRAVENOUS

## 2023-07-15 MED ORDER — POLYETHYLENE GLYCOL 3350 17 G PO PACK
17.0000 g | PACK | Freq: Every day | ORAL | 0 refills | Status: DC
  Filled 2023-07-15: qty 14, 14d supply, fill #0

## 2023-07-15 NOTE — ED Triage Notes (Signed)
 Patient bib [facility] from [facility] for urinary retention. Patient had chronic indwelling foley catheter and facility reports doing a "trial" without catheter. Patient has not urinated in 20 hours and complaining of pain in abdomen. Facility states they were unable to place catheter in patient due to his pain.

## 2023-07-15 NOTE — Discharge Instructions (Addendum)
 Foley catheter was replaced successfully.  Lab work and CT scan looks stable.  CT does show thickening of the bladder which is likely chronic as well as some constipation.  Urine culture is sent and pending.  Follow-up with urology for further assessment of Foley catheter.  Return to the ED with new or worsening symptoms.

## 2023-07-15 NOTE — ED Notes (Signed)
 Called ptar for pt pick

## 2023-07-15 NOTE — ED Provider Notes (Signed)
 [place] EMERGENCY DEPARTMENT AT [facility] Provider Note   CSN: [id number] Arrival date & time: 07/15/23  0344     History  Chief Complaint  Patient presents with   Urinary Retention    Alexander Fritz is a 33 y.o. male.  Level 5 caveat for autism and traumatic brain injury.  Patient unable to give meaningful history.  From nursing facility today with urinary retention.  He apparently has chronic indwelling Foley catheter and was given a "trial without it".  He has had no urine output since 7 AM February 22.  He denies any abdominal pain.  Denies any vomiting, fever, diarrhea, constipation, chest pain or shortness of breath.  No testicular pain. Facility was not able to get catheter back in.  Patient was admitted to the hospital about a month ago with sepsis from UTI.  Treated for Proteus infection given his previous cultures.  He was found to have MRSA in his latest urine culture about was thought to be colonized and ID did not recommend treatment.  Denies fever or vomiting.  Denies constipation.  EMS crew familiar with patient and feel that he is at his baseline.   The history is provided by the patient and the EMS personnel.       Home Medications Prior to Admission medications   Medication Sig Start Date End Date Taking? Authorizing Provider  acetaminophen (TYLENOL) 325 MG tablet Take 2 tablets (650 mg total) by mouth every 6 (six) hours as needed for mild pain (pain score 1-3) (or Fever >/= 101). 06/16/23   Pokhrel, Rebekah Chesterfield, MD  bethanechol (URECHOLINE) 25 MG tablet Place 1 tablet (25 mg total) into feeding tube 3 (three) times daily. 02/14/23   Maczis, Elmer Sow, PA-C  dicyclomine (BENTYL) 10 MG capsule Place 10 mg into feeding tube every 8 (eight) hours as needed for spasms (abd pain).    [provider]  docusate (COLACE) 50 MG/5ML liquid Place 10 mLs (100 mg total) into feeding tube daily as needed for mild constipation. 02/14/23   Maczis, Elmer Sow, PA-C   hydrOXYzine (ATARAX) 25 MG tablet Place 50 mg into feeding tube every 8 (eight) hours as needed for anxiety.    [provider]  LORazepam (ATIVAN) 2 MG/ML concentrated solution Place 0.25 mLs into feeding tube every 6 (six) hours as needed for anxiety. 0.5mg  = 0.66ml    [provider]  Nutritional Supplements (FEEDING SUPPLEMENT, OSMOLITE 1.5 CAL,) LIQD Place 1,000 mLs into feeding tube continuous. 78ml/hr    [provider]  oxybutynin (DITROPAN-XL) 5 MG 24 hr tablet Take 5 mg by mouth at bedtime. GIVE 1 TABLET PER G TUBE EVERY 8 HOURS AS NEEDED FOR BLADDER / SPASMS    [provider]  oxycodone (OXY-IR) 5 MG capsule Place 5 mg into feeding tube every 6 (six) hours as needed for pain.    [provider]  polycarbophil (FIBERCON) 625 MG tablet Place 1 tablet (625 mg total) into feeding tube as needed for diarrhea or loose stools. 02/14/23   Maczis, Elmer Sow, PA-C  polyethylene glycol (MIRALAX / GLYCOLAX) 17 g packet Place 17 g into feeding tube daily as needed (constipation). 02/14/23   Maczis, Elmer Sow, PA-C  risperiDONE (RISPERDAL) 2 MG tablet Place 1 tablet (2 mg total) into feeding tube daily. 02/15/23   Maczis, Elmer Sow, PA-C  trihexyphenidyl (ARTANE) 5 MG tablet Place 1 tablet (5 mg total) into feeding tube 2 (two) times daily. 02/14/23   Leary Roca  M, PA-C  valproic acid (DEPAKENE) 250 MG/5ML solution Place 10 mLs (500 mg total) into feeding tube every 8 (eight) hours. 02/14/23   Maczis, Elmer Sow, PA-C  Water For Irrigation, Sterile (FREE WATER) SOLN Place 150 mLs into feeding tube every 4 (four) hours. Flush tube feeding tube    [provider]      Allergies    Cefaclor, Egg-derived products, and Lithium    Review of Systems   Review of Systems  Gastrointestinal:  Negative for abdominal pain, nausea and vomiting.  Genitourinary:  Negative for dysuria and hematuria.  Musculoskeletal:  Negative for arthralgias.  Neurological:   Negative for weakness.   all other systems are negative except as noted in the HPI and PMH.    Physical Exam Updated Vital Signs BP 118/76 (BP Location: Right Arm)   Pulse 94   Temp 97.9 F (36.6 C) (Oral)   Resp 16   Ht 6' (1.829 m)   Wt 65.7 kg   SpO2 100%   BMI 19.64 kg/m  Physical Exam Vitals and nursing note reviewed.  Constitutional:      General: He is not in acute distress.    Appearance: He is well-developed.     Comments: Oriented to person only, no distress  HENT:     Head: Normocephalic and atraumatic.     Mouth/Throat:     Pharynx: No oropharyngeal exudate.  Eyes:     Conjunctiva/sclera: Conjunctivae normal.     Pupils: Pupils are equal, round, and reactive to light.  Neck:     Comments: No meningismus. Cardiovascular:     Rate and Rhythm: Normal rate and regular rhythm.     Heart sounds: Normal heart sounds. No murmur heard. Pulmonary:     Effort: Pulmonary effort is normal. No respiratory distress.     Breath sounds: Normal breath sounds.  Abdominal:     General: There is distension.     Palpations: Abdomen is soft.     Tenderness: There is abdominal tenderness. There is no guarding or rebound.     Comments: Bladder distention and suprapubic fullness.  Genitourinary:    Comments: No fecal impaction Musculoskeletal:        General: No tenderness. Normal range of motion.     Cervical back: Normal range of motion and neck supple.  Skin:    General: Skin is warm.  Neurological:     Mental Status: He is alert and oriented to person, place, and time.     Cranial Nerves: No cranial nerve deficit.     Motor: No abnormal muscle tone.     Coordination: Coordination normal.     Comments: Moves all extremities equally.  Follows some commands.  Psychiatric:        Behavior: Behavior normal.     ED Results / Procedures / Treatments   Labs (all labs ordered are listed, but only abnormal results are displayed) Labs Reviewed  COMPREHENSIVE METABOLIC PANEL  - Abnormal; Notable for the following components:      Result Value   Potassium 3.3 (*)    Glucose, Bld 131 (*)    BUN <5 (*)    Total Protein 6.3 (*)    Albumin 3.1 (*)    AST 14 (*)    All other components within normal limits  URINALYSIS, ROUTINE W REFLEX MICROSCOPIC - Abnormal; Notable for the following components:   Hgb urine dipstick MODERATE (*)    Leukocytes,Ua LARGE (*)    All other components  within normal limits  URINE CULTURE  CBC WITH DIFFERENTIAL/PLATELET  VALPROIC ACID LEVEL  LIPASE, BLOOD    EKG None  Radiology CT ABDOMEN PELVIS W CONTRAST Result Date: 07/15/2023 CLINICAL DATA:  Abdominal pain and decreased urine output. EXAM: CT ABDOMEN AND PELVIS WITH CONTRAST TECHNIQUE: Multidetector CT imaging of the abdomen and pelvis was performed using the standard protocol following bolus administration of intravenous contrast. RADIATION DOSE REDUCTION: This exam was performed according to the departmental dose-optimization program which includes automated exposure control, adjustment of the mA and/or kV according to patient size and/or use of iterative reconstruction technique. CONTRAST:  75mL OMNIPAQUE IOHEXOL 350 MG/ML SOLN COMPARISON:  CT with IV contrast 05/21/2023 and 12/24/2022 FINDINGS: Lower chest: Lung bases are clear with asymmetrically elevated right hemidiaphragm. The cardiac size is normal. Hepatobiliary: Limited fine detail in the liver due to respiratory motion. No mass is suspected. The gallbladder and bile ducts are unremarkable. Pancreas: No abnormality Spleen: No abnormality. Adrenals/Urinary Tract: Adrenal glands are unremarkable. Kidneys are normal, without renal calculi, focal lesion, or hydronephrosis. Bladder is catheterized and contracted but again diffusely thickened even allowing for contraction. Perivesical stranding is noted but not as much as previously. Findings compatible with cystitis versus neurogenic bladder. Stomach/Bowel: PEG tube stable in  positioning. No acute associated complication. Unremarkable gastric wall, normal caliber unopacified small bowel. Normal appendix. Moderate fecal stasis with otherwise unremarkable large bowel. Vascular/Lymphatic: No significant vascular findings are present. No enlarged abdominal or pelvic lymph nodes. Reproductive: Prostate is unremarkable. Other: No abdominal wall hernia or abnormality. No abdominopelvic ascites. Musculoskeletal: Chronic bilateral healed pubic rami fracture deformities. No acute or significant osseous findings. IMPRESSION: 1. Catheterized and contracted bladder but again diffusely thickened even allowing for contraction. Perivesical stranding is noted but not as much as previously. Findings compatible with cystitis versus neurogenic bladder. 2. Constipation. 3. PEG tube stable in positioning. 4. Chronic bilateral healed pubic rami fracture deformities. Electronically Signed   By: Almira Bar M.D.   On: 07/15/2023 06:01    Procedures Procedures    Medications Ordered in ED Medications - No data to display  ED Course/ Medical Decision Making/ A&P                                 Medical Decision Making Amount and/or Complexity of Data Reviewed Independent Historian: EMS Labs: ordered. Decision-making details documented in ED Course. Radiology: ordered and independent interpretation performed. Decision-making details documented in ED Course. ECG/medicine tests: ordered and independent interpretation performed. Decision-making details documented in ED Course.  Risk OTC drugs. Prescription drug management.   Urinary retention after Foley catheter was removed for unclear reasons. Stable vitals, no distress, concern for urinary retention.  Will check bladder scan.  Bladder scan 168 mL. Question accuracy.  Foley catheter was placed with good success.  Drained approximately only 150 cc of urine.  Abdomen still feels distended.  Will proceed with CT imaging to rule out  bowel obstruction or other acute pathology.  Kidney function is normal.  UA Again shows leukocytes and pyuria.  Will send culture.  He has no infectious symptoms currently.  No bowel obstruction seen on CT scan.  Some constipation.  Contracted bladder with thickened bladder wall similar to previous.  This may be from neurogenic bladder and chronic inflammation  Patient has stable vital signs and stable labs.  Denies abdominal pain.  He appears stable to return to his facility.  Will give  bowel regimen.  Will resend urine culture. Return precautions discussed.  Attempted to notify legal guardian without success.        Final Clinical Impression(s) / ED Diagnoses Final diagnoses:  Urinary retention  Complication of Foley catheter, initial encounter [facility])    Rx / DC Orders ED Discharge Orders     None         Satori Krabill, Jeannett Senior, MD 07/15/23 [phone]

## 2023-07-15 NOTE — ED Notes (Signed)
 Pt screaming out that people are out to kill him, redirected patient and offered pt sandwhich. Provided pt with Sandwich and drink

## 2023-07-16 ENCOUNTER — Other Ambulatory Visit (HOSPITAL_COMMUNITY): Payer: Self-pay

## 2023-07-16 LAB — URINE CULTURE: Culture: NO GROWTH

## 2023-07-20 ENCOUNTER — Other Ambulatory Visit: Payer: Self-pay

## 2023-07-20 ENCOUNTER — Emergency Department (HOSPITAL_COMMUNITY)
Admission: EM | Admit: 2023-07-20 | Discharge: 2023-07-21 | Disposition: A | Discharge status: Home / Self Care | Payer: 59 | Attending: Emergency Medicine | Admitting: Emergency Medicine

## 2023-07-20 DIAGNOSIS — T839XXA Unspecified complication of genitourinary prosthetic device, implant and graft, initial encounter: Secondary | ICD-10-CM

## 2023-07-20 DIAGNOSIS — T8379XA Other specified complications due to other genitourinary prosthetic materials, initial encounter: Secondary | ICD-10-CM | POA: Insufficient documentation

## 2023-07-20 DIAGNOSIS — R339 Retention of urine, unspecified: Secondary | ICD-10-CM | POA: Diagnosis present

## 2023-07-20 DIAGNOSIS — F840 Autistic disorder: Secondary | ICD-10-CM | POA: Diagnosis not present

## 2023-07-20 DIAGNOSIS — R310 Gross hematuria: Secondary | ICD-10-CM | POA: Diagnosis not present

## 2023-07-20 DIAGNOSIS — Y732 Prosthetic and other implants, materials and accessory gastroenterology and urology devices associated with adverse incidents: Secondary | ICD-10-CM | POA: Insufficient documentation

## 2023-07-20 LAB — CBC WITH DIFFERENTIAL/PLATELET
Abs Immature Granulocytes: 0.04 10*3/uL (ref 0.00–0.07)
Basophils Absolute: 0 10*3/uL (ref 0.0–0.1)
Basophils Relative: 0 %
Eosinophils Absolute: 0.1 10*3/uL (ref 0.0–0.5)
Eosinophils Relative: 1 %
HCT: 42.8 % (ref 39.0–52.0)
Hemoglobin: 13.9 g/dL (ref 13.0–17.0)
Immature Granulocytes: 0 %
Lymphocytes Relative: 13 %
Lymphs Abs: 1.5 10*3/uL (ref 0.7–4.0)
MCH: 28.3 pg (ref 26.0–34.0)
MCHC: 32.5 g/dL (ref 30.0–36.0)
MCV: 87 fL (ref 80.0–100.0)
Monocytes Absolute: 0.8 10*3/uL (ref 0.1–1.0)
Monocytes Relative: 7 %
Neutro Abs: 8.8 10*3/uL — ABNORMAL HIGH (ref 1.7–7.7)
Neutrophils Relative %: 79 %
Platelets: 373 10*3/uL (ref 150–400)
RBC: 4.92 MIL/uL (ref 4.22–5.81)
RDW: 12.7 % (ref 11.5–15.5)
WBC: 11.3 10*3/uL — ABNORMAL HIGH (ref 4.0–10.5)
nRBC: 0 % (ref 0.0–0.2)

## 2023-07-20 LAB — BASIC METABOLIC PANEL
Anion gap: 10 (ref 5–15)
BUN: 5 mg/dL — ABNORMAL LOW (ref 6–20)
CO2: 24 mmol/L (ref 22–32)
Calcium: 9.1 mg/dL (ref 8.9–10.3)
Chloride: 104 mmol/L (ref 98–111)
Creatinine, Ser: 0.62 mg/dL (ref 0.61–1.24)
GFR, Estimated: >60 mL/min (ref >60)
Glucose, Bld: 114 mg/dL — ABNORMAL HIGH (ref 70–99)
Potassium: 3.6 mmol/L (ref 3.5–5.1)
Sodium: 138 mmol/L (ref 135–145)

## 2023-07-20 LAB — URINALYSIS, ROUTINE W REFLEX MICROSCOPIC

## 2023-07-20 LAB — URINALYSIS, MICROSCOPIC (REFLEX)
Bacteria, UA: NONE SEEN
RBC / HPF: >50 RBC/hpf (ref 0–5)

## 2023-07-20 MED ORDER — LORAZEPAM 2 MG/ML IJ SOLN
1.0000 mg | Freq: Once | INTRAMUSCULAR | Status: AC
  Administered 2023-07-20: 1 mg via INTRAVENOUS
  Filled 2023-07-20: qty 1

## 2023-07-20 MED ORDER — SODIUM CHLORIDE 0.9 % IR SOLN
3000.0000 mL | Status: DC
  Administered 2023-07-20: 3000 mL

## 2023-07-20 MED ORDER — NITROFURANTOIN MONOHYD MACRO 100 MG PO CAPS
100.0000 mg | ORAL_CAPSULE | Freq: Two times a day (BID) | ORAL | 0 refills | Status: AC
  Filled 2023-07-20: qty 10, 5d supply, fill #0
  Filled 2023-07-21: qty 14, 7d supply, fill #0
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
## 2023-07-20 NOTE — ED Notes (Signed)
 3 way foley catheter inserted and CBI initiated. Pt tolerated well and VS assessed post insertion. MD aware of foley insertion

## 2023-07-20 NOTE — ED Provider Notes (Signed)
  Physical Exam  BP 108/80   Pulse 98   Temp 97.7 F (36.5 C) (Oral)   Resp 15   Wt 65 kg   SpO2 99%   BMI 19.43 kg/m   Physical Exam  Procedures  Procedures  ED Course / MDM     History of TBI, indwelling foley, catheter came out and unable to Now ith 3 way catheter, getting CBI and is clearing.   Completed CBI.  Urine dark pink color, no sign of clots and do not suspect continued active bleeding. UA with possible infection. GIven rx for macrobid. Stable for discharge back to facility. Patient discharged in stable condition with understanding of reasons to return.      Alvira Monday, MD 07/21/23 1056
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
## 2023-07-20 NOTE — ED Provider Triage Note (Signed)
 Emergency Medicine Provider Triage Evaluation Note  EMMA SCHUPP , a 33 y.o. male  was evaluated in triage.  Pt complains of blood in urine. Per facility pt's catheter not draining today, placed new and large amount of blood now draining. Pt denies blood thinners or pain.  Review of Systems  Positive: hematuria Negative: Abd pain  Physical Exam  BP 108/80   Pulse 98   Temp 97.7 F (36.5 C) (Oral)   Resp 15   Wt 65 kg   SpO2 99%   BMI 19.43 kg/m  Gen:   Awake, no distress   Resp:  Normal effort  MSK:   Moves extremities without difficulty  Other:  +foley in place, draining appropriately, gross hematuria on exam, no suprapubic ttp  Medical Decision Making  Medically screening exam initiated at 2:16 PM.  Appropriate orders placed.  Priscella Mann was informed that the remainder of the evaluation will be completed by another provider, this initial triage assessment does not replace that evaluation, and the importance of remaining in the ED until their evaluation is complete.    Pete Pelt, [place] 07/20/23 1417

## 2023-07-20 NOTE — ED Notes (Signed)
 Bladder Irrigation Input: Output: 

## 2023-07-20 NOTE — ED Notes (Signed)
 Attempted to call [facility] no answer x3

## 2023-07-20 NOTE — ED Notes (Signed)
 [facility] called for transport back to [facility]

## 2023-07-20 NOTE — ED Notes (Signed)
 Legal Guardian [name] made aware of patient going back to facility.

## 2023-07-21 ENCOUNTER — Other Ambulatory Visit (HOSPITAL_COMMUNITY): Payer: Self-pay

## 2023-07-30 ENCOUNTER — Other Ambulatory Visit (HOSPITAL_COMMUNITY): Payer: Self-pay

## 2023-09-12 ENCOUNTER — Other Ambulatory Visit: Payer: Self-pay

## 2023-09-12 ENCOUNTER — Emergency Department (HOSPITAL_COMMUNITY)
Admission: EM | Admit: 2023-09-12 | Discharge: 2023-09-12 | Disposition: A | Discharge status: Skilled Nursing Facility | Attending: Emergency Medicine | Admitting: Emergency Medicine

## 2023-09-12 ENCOUNTER — Encounter (HOSPITAL_COMMUNITY): Payer: Self-pay

## 2023-09-12 ENCOUNTER — Other Ambulatory Visit (HOSPITAL_COMMUNITY): Payer: Self-pay

## 2023-09-12 DIAGNOSIS — Z466 Encounter for fitting and adjustment of urinary device: Secondary | ICD-10-CM

## 2023-09-12 DIAGNOSIS — N390 Urinary tract infection, site not specified: Secondary | ICD-10-CM | POA: Insufficient documentation

## 2023-09-12 DIAGNOSIS — F840 Autistic disorder: Secondary | ICD-10-CM | POA: Diagnosis not present

## 2023-09-12 DIAGNOSIS — D72829 Elevated white blood cell count, unspecified: Secondary | ICD-10-CM | POA: Diagnosis not present

## 2023-09-12 DIAGNOSIS — R451 Restlessness and agitation: Secondary | ICD-10-CM | POA: Insufficient documentation

## 2023-09-12 DIAGNOSIS — Z960 Presence of urogenital implants: Secondary | ICD-10-CM | POA: Diagnosis not present

## 2023-09-12 LAB — COMPREHENSIVE METABOLIC PANEL WITH GFR
ALT: 14 U/L (ref 0–44)
AST: 17 U/L (ref 15–41)
Albumin: 3.8 g/dL (ref 3.5–5.0)
Alkaline Phosphatase: 77 U/L (ref 38–126)
Anion gap: 10 (ref 5–15)
BUN: 10 mg/dL (ref 6–20)
CO2: 25 mmol/L (ref 22–32)
Calcium: 9.4 mg/dL (ref 8.9–10.3)
Chloride: 103 mmol/L (ref 98–111)
Creatinine, Ser: 0.78 mg/dL (ref 0.61–1.24)
GFR, Estimated: >60 mL/min (ref >60)
Glucose, Bld: 92 mg/dL (ref 70–99)
Potassium: 3.9 mmol/L (ref 3.5–5.1)
Sodium: 138 mmol/L (ref 135–145)
Total Bilirubin: 0.7 mg/dL (ref 0.0–1.2)
Total Protein: 7.4 g/dL (ref 6.5–8.1)

## 2023-09-12 LAB — URINALYSIS, ROUTINE W REFLEX MICROSCOPIC
Bilirubin Urine: NEGATIVE
Glucose, UA: NEGATIVE mg/dL
Ketones, ur: NEGATIVE mg/dL
Nitrite: NEGATIVE
Protein, ur: 100 mg/dL — AB
Specific Gravity, Urine: 1.025 (ref 1.005–1.030)
WBC, UA: >50 WBC/hpf (ref 0–5)
pH: 7 (ref 5.0–8.0)

## 2023-09-12 LAB — CBC WITH DIFFERENTIAL/PLATELET
Abs Immature Granulocytes: 0.05 10*3/uL (ref 0.00–0.07)
Basophils Absolute: 0.1 10*3/uL (ref 0.0–0.1)
Basophils Relative: 0 %
Eosinophils Absolute: 0 10*3/uL (ref 0.0–0.5)
Eosinophils Relative: 0 %
HCT: 44.5 % (ref 39.0–52.0)
Hemoglobin: 14.4 g/dL (ref 13.0–17.0)
Immature Granulocytes: 0 %
Lymphocytes Relative: 17 %
Lymphs Abs: 2 10*3/uL (ref 0.7–4.0)
MCH: 27.7 pg (ref 26.0–34.0)
MCHC: 32.4 g/dL (ref 30.0–36.0)
MCV: 85.7 fL (ref 80.0–100.0)
Monocytes Absolute: 1.1 10*3/uL — ABNORMAL HIGH (ref 0.1–1.0)
Monocytes Relative: 9 %
Neutro Abs: 9 10*3/uL — ABNORMAL HIGH (ref 1.7–7.7)
Neutrophils Relative %: 74 %
Platelets: 364 10*3/uL (ref 150–400)
RBC: 5.19 MIL/uL (ref 4.22–5.81)
RDW: 12.4 % (ref 11.5–15.5)
WBC: 12.2 10*3/uL — ABNORMAL HIGH (ref 4.0–10.5)
nRBC: 0 % (ref 0.0–0.2)

## 2023-09-12 MED ORDER — NITROFURANTOIN MONOHYD MACRO 100 MG PO CAPS
100.0000 mg | ORAL_CAPSULE | Freq: Two times a day (BID) | ORAL | 0 refills | Status: AC
  Filled 2023-09-12: qty 14, 7d supply, fill #0

## 2023-09-12 NOTE — ED Provider Notes (Addendum)
 Patient brought to the emergency department after refusing to have urinary catheter replaced.  Patient has had catheter placed in the past for urinary retention.  Patient was seen by the provider before me.  I assumed his care at 6:30 AM.  Patient has been able to urinate without difficulty.  UA shows large leukocytes greater than 50 white blood cells.  I consulted patient's guardian.  Suzzanne Estrin  at [phone].  She spoke to the patient's team and advised that patient does have the capacity to refuse Foley.  Patient has continued to be able to urinate.  There may be a possibility that he will have urinary retention, however there are no signs of obstruction at this time.  Patient is discharged to the facility where he resides.    Sandi Crosby, PA-C 09/12/23 1106    Esterlene Atiyeh K, PA-C 09/12/23 1229    Burnette Carte, MD 09/12/23 [phone]

## 2023-09-12 NOTE — ED Notes (Addendum)
 Per Alexander Fritz of [place] unable to be reached. Patient to return to [name].
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
## 2023-09-12 NOTE — Progress Notes (Addendum)
 10:40am: CSW spoke with Ernestina Headland at [facility] who states patient can return to [facility].  The number to call for report is [phone] - RN to call for transport when ready.  9:55am: CSW spoke with Whitney at [facility] who confirms patient is LTC from the facility and that he can return once medically cleared for discharge.  CSW attempted to reach patient's legal guardian Kayla at [facility] [place] however she is out on leave at this time.  CSW attempted to reach Ernestina Headland, Merchandiser, retail at [facility] [place] to discuss patient, however no answer so a voicemail was left requesting a return call.  Shepard Dicker, MSW, LCSW Transitions of Care  Clinical Social Worker II [phone]

## 2023-09-12 NOTE — ED Notes (Signed)
 Pt was able to urinate on his own without difficulty. PA notified.
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
## 2023-09-12 NOTE — Discharge Instructions (Addendum)
 I spoke with legal guardian who reports pt can refuse foley catheter.  Pt has been given a prescription for urinary tract infection

## 2023-09-12 NOTE — ED Notes (Signed)
 Voicemail left with legal guardian, [facility] [place], Artelia Bijou.

## 2023-09-12 NOTE — ED Notes (Signed)
 Patient encouraged to urinate by RN

## 2023-09-14 LAB — URINE CULTURE: Culture: 60000 — AB

## 2023-09-15 ENCOUNTER — Telehealth (HOSPITAL_BASED_OUTPATIENT_CLINIC_OR_DEPARTMENT_OTHER): Payer: Self-pay | Admitting: *Deleted

## 2023-09-15 NOTE — Telephone Encounter (Signed)
 Post ED Visit - Positive Culture Follow-up  Culture report reviewed by antimicrobial stewardship pharmacist: [facility] []  [address] Pharm.D. []  Skeet Duke, Pharm.D., BCPS AQ-ID []  Leslee Rase, Pharm.D., BCPS []  Garland Junk, Pharm.D., BCPS []  Baker, [address].D., BCPS, AAHIVP []  Alcide Aly, Pharm.D., BCPS, AAHIVP []  Jerri Morale, PharmD, BCPS []  Graham Laws, PharmD, BCPS []  Cleda Curly, PharmD, BCPS []  Tamar Fairly, PharmD []  Ballard Levels, PharmD, BCPS [x]  Dionicio Fray, PharmD  [facility] []  Arlyne Bering, PharmD []  Sherryle Don, PharmD []  Van Gelinas, PharmD []  Delila Felty, Rph []  [place]) Cleora Daft, PharmD []  Augustina Block, PharmD []  Arie Kurtz, PharmD []  Sharlyn Deaner, PharmD []  Agnes Hose, PharmD []  Kendall Pauls, PharmD []  Gladstone Lamer, PharmD []  Armanda Bern, PharmD []  Tera Fellows, PharmD   Positive urine culture Treated with Nitrofurantoin , organism sensitive to the same and no further patient follow-up is required at this time.  Alexander Fritz 09/15/2023, 2:11 PM

## 2023-09-22 ENCOUNTER — Other Ambulatory Visit (HOSPITAL_COMMUNITY): Payer: Self-pay

## 2023-11-13 ENCOUNTER — Telehealth (HOSPITAL_COMMUNITY): Payer: Self-pay

## 2023-11-13 NOTE — Telephone Encounter (Signed)
 Received an order for surgical consult for peg removal. Called [facility] to let them know that we didn't place pt's peg tube. Our Rad's usually won't remove if we didn't place. She will find out where pt had placed and call back if needed. AB

## 2023-12-06 NOTE — Progress Notes (Signed)
   PROVIDER:  PUJA GOSAI MACZIS, PA  MRN: [id number] DOB: 1990/11/10 DATE OF ENCOUNTER: 12/06/2023 Interval History:   Alexander Fritz is a 33 y.o. male with multiple medical issues who underwent tracheostomy and peg tube placement for TBI on 01/05/2023 by Dr. Paola.  The PEG tube was replaced by Dr. Lyndel at bedside on 02/10/2023.  A 16 French G-tube with 5 cc saline was placed.  Received a referral from [facility] to have the tube removed.  He is here with his caregiver who states he has been eating by mouth for months without any issues.  He is having good weight gain.  Denies any issues with swallowing.  Review of Systems:   ROS All other systems reviewed and are negative.  Medications:   Current Outpatient Medications on File Prior to Visit  Medication Sig Dispense Refill  . calcium polycarbophiL (FIBERCON) 625 mg tablet 625 mg    . docusate (COLACE) 50 mg/5 mL liquid 100 mg    . oxyBUTYnin  (DITROPAN -XL) 5 MG XL tablet Take 5 mg by mouth    . risperiDONE  (RISPERDAL  M-TABS) 2 MG disintegrating tablet     . trihexyphenidyL  (ARTANE ) 5 MG tablet 5 mg    . valproic  acid (DEPAKENE ) 250 mg/5 mL oral solution 500 mg     No current facility-administered medications on file prior to visit.    Physical Examination:   BP 102/67   Pulse 61   Temp 36.7 C (98 F)   Ht 172.7 cm (5' 8)   Wt 75.3 kg (166 lb)   SpO2 97%   BMI 25.24 kg/m  General: Well-developed, well-nourished, in no acute distress.   Abdomen: G-tube in place on upper abdomen.  Balloon was deflated and tube was removed without any issues.  There is a 1 cm piece of granulation tissue without erythema or drainage    Assessment and Plan:   Diagnoses and all orders for this visit:  Traumatic brain injury with loss of consciousness, sequela ()  S/P percutaneous endoscopic gastrostomy (PEG) tube placement (CMS/HHS-HCC)    Alexander Fritz is status post tracheostomy and peg tube placement for TBI on 01/05/2023 by Dr.  Paola.  G-tube was removed today without any issues.  Wound care instructions were given.  He was advised to call in a few weeks if the G-tube site has not fully healed in which case we will apply silver nitrate.  The caregiver provided understanding.  Return if symptoms worsen or fail to improve.  Puja Maczis, [facility]

## 2024-03-28 ENCOUNTER — Encounter (HOSPITAL_COMMUNITY): Payer: Self-pay

## 2024-03-28 ENCOUNTER — Emergency Department (HOSPITAL_COMMUNITY)
Admission: EM | Admit: 2024-03-28 | Discharge: 2024-03-29 | Disposition: A | Discharge status: Nursing Home (Includes ICF) | Payer: Medicare (Managed Care) | Attending: Emergency Medicine | Admitting: Emergency Medicine

## 2024-03-28 DIAGNOSIS — Z8673 Personal history of transient ischemic attack (TIA), and cerebral infarction without residual deficits: Secondary | ICD-10-CM | POA: Diagnosis not present

## 2024-03-28 DIAGNOSIS — F840 Autistic disorder: Secondary | ICD-10-CM | POA: Diagnosis not present

## 2024-03-28 DIAGNOSIS — F4324 Adjustment disorder with disturbance of conduct: Secondary | ICD-10-CM | POA: Diagnosis not present

## 2024-03-28 DIAGNOSIS — Z046 Encounter for general psychiatric examination, requested by authority: Secondary | ICD-10-CM

## 2024-03-28 DIAGNOSIS — R456 Violent behavior: Secondary | ICD-10-CM | POA: Diagnosis present

## 2024-03-28 DIAGNOSIS — Z79899 Other long term (current) drug therapy: Secondary | ICD-10-CM | POA: Diagnosis not present

## 2024-03-28 DIAGNOSIS — R4689 Other symptoms and signs involving appearance and behavior: Secondary | ICD-10-CM | POA: Diagnosis present

## 2024-03-28 DIAGNOSIS — F209 Schizophrenia, unspecified: Secondary | ICD-10-CM | POA: Insufficient documentation

## 2024-03-28 LAB — CBC WITH DIFFERENTIAL/PLATELET
Abs Immature Granulocytes: 0.02 K/uL (ref 0.00–0.07)
Basophils Absolute: 0 K/uL (ref 0.0–0.1)
Basophils Relative: 0 %
Eosinophils Absolute: 0.1 K/uL (ref 0.0–0.5)
Eosinophils Relative: 1 %
HCT: 42.5 % (ref 39.0–52.0)
Hemoglobin: 13.5 g/dL (ref 13.0–17.0)
Immature Granulocytes: 0 %
Lymphocytes Relative: 32 %
Lymphs Abs: 1.9 K/uL (ref 0.7–4.0)
MCH: 27.4 pg (ref 26.0–34.0)
MCHC: 31.8 g/dL (ref 30.0–36.0)
MCV: 86.4 fL (ref 80.0–100.0)
Monocytes Absolute: 0.6 K/uL (ref 0.1–1.0)
Monocytes Relative: 11 %
Neutro Abs: 3.2 K/uL (ref 1.7–7.7)
Neutrophils Relative %: 56 %
Platelets: 340 K/uL (ref 150–400)
RBC: 4.92 MIL/uL (ref 4.22–5.81)
RDW: 12.5 % (ref 11.5–15.5)
WBC: 5.8 K/uL (ref 4.0–10.5)
nRBC: 0 % (ref 0.0–0.2)

## 2024-03-28 LAB — URINE DRUG SCREEN
Amphetamines: NEGATIVE
Barbiturates: NEGATIVE
Benzodiazepines: POSITIVE — AB
Cocaine: NEGATIVE
Fentanyl: NEGATIVE
Methadone Scn, Ur: NEGATIVE
Opiates: NEGATIVE
Tetrahydrocannabinol: NEGATIVE

## 2024-03-28 LAB — URINALYSIS, W/ REFLEX TO CULTURE (INFECTION SUSPECTED)
Bilirubin Urine: NEGATIVE
Glucose, UA: NEGATIVE mg/dL
Hgb urine dipstick: NEGATIVE
Ketones, ur: NEGATIVE mg/dL
Leukocytes,Ua: NEGATIVE
Nitrite: NEGATIVE
Protein, ur: NEGATIVE mg/dL
Specific Gravity, Urine: 1.025 (ref 1.005–1.030)
pH: 6 (ref 5.0–8.0)

## 2024-03-28 LAB — COMPREHENSIVE METABOLIC PANEL WITH GFR
ALT: 36 U/L (ref 0–44)
AST: 22 U/L (ref 15–41)
Albumin: 4.2 g/dL (ref 3.5–5.0)
Alkaline Phosphatase: 106 U/L (ref 38–126)
Anion gap: 10 (ref 5–15)
BUN: 9 mg/dL (ref 6–20)
CO2: 25 mmol/L (ref 22–32)
Calcium: 9.6 mg/dL (ref 8.9–10.3)
Chloride: 104 mmol/L (ref 98–111)
Creatinine, Ser: 0.71 mg/dL (ref 0.61–1.24)
GFR, Estimated: >60 mL/min (ref >60)
Glucose, Bld: 105 mg/dL — ABNORMAL HIGH (ref 70–99)
Potassium: 3.7 mmol/L (ref 3.5–5.1)
Sodium: 138 mmol/L (ref 135–145)
Total Bilirubin: 0.3 mg/dL (ref 0.0–1.2)
Total Protein: 7.4 g/dL (ref 6.5–8.1)

## 2024-03-28 LAB — ETHANOL: Alcohol, Ethyl (B): <15 mg/dL (ref <15)

## 2024-03-28 LAB — VALPROIC ACID LEVEL: Valproic Acid Lvl: 14 ug/mL — ABNORMAL LOW (ref 50–100)

## 2024-03-28 NOTE — ED Notes (Addendum)
 [facility] called April, Nursing Supervisor at [facility], pts nursing facility to inform her that pt is being psych cleared and is ready to return to the facility. April understood. Pt is under IVC and can return via sheriff transport.   [facility] requested pts most recent medication list. April agreed to fax it to [facility].  Chesley Holt, [facility]  04/07/24

## 2024-03-28 NOTE — ED Notes (Signed)
 [facility] called pts group home owner, Ronalee Irving for collateral information. Mr. Madie was unaware that pt had been IVC'd an brought to the [facility] ED from his nursing facility [facility]. Mr. Irving called Ileana Gaskins, pts LG in to a three way call. Per Mr. Irving and Ms. Clark, pt was admitted medically a while ago, released to the nursing facility and is scheduled to return to his group home on 11/24. Mr. Irving confirmed that pt is verbal. [facility] explained that pt will be psychiatrically cleared and can be discharged. Ms. Gaskins asked that pt be discharged back the nursing facility.   [facility] agreed to contact April, the nursing supervisor [phone]) to inform them that pt is being cleared and will be returning back to the facility.   Chesley Holt, [facility]  03/28/24
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
## 2024-03-28 NOTE — ED Notes (Signed)
 Pt arrived from [facility]
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
## 2024-03-29 MED ORDER — POLYETHYLENE GLYCOL 3350 17 G PO PACK: 17.0000 g | PACK | Freq: Every day | ORAL | Status: DC | PRN

## 2024-03-29 MED ORDER — VALPROIC ACID 250 MG/5ML PO SOLN
500.0000 mg | Freq: Three times a day (TID) | ORAL | Status: DC
  Filled 2024-03-29: qty 10

## 2024-03-29 MED ORDER — CALCIUM POLYCARBOPHIL 625 MG PO TABS: 625.0000 mg | ORAL_TABLET | Freq: Every day | ORAL | Status: DC | PRN

## 2024-03-29 MED ORDER — LORAZEPAM 2 MG/ML PO CONC: 2.0000 mg | Freq: Four times a day (QID) | ORAL | Status: DC | PRN

## 2024-03-29 MED ORDER — RISPERIDONE 2 MG PO TABS
2.0000 mg | ORAL_TABLET | Freq: Once | ORAL | Status: AC
  Administered 2024-03-29: 2 mg via ORAL

## 2024-03-29 MED ORDER — RISPERIDONE 2 MG PO TABS
2.0000 mg | ORAL_TABLET | Freq: Every day | ORAL | Status: DC
  Filled 2024-03-29: qty 1

## 2024-03-29 MED ORDER — TRIHEXYPHENIDYL HCL 5 MG PO TABS
5.0000 mg | ORAL_TABLET | Freq: Two times a day (BID) | ORAL | Status: DC
  Filled 2024-03-29: qty 1

## 2024-03-29 MED ORDER — TRIHEXYPHENIDYL HCL 5 MG PO TABS
5.0000 mg | ORAL_TABLET | Freq: Two times a day (BID) | ORAL | Status: DC
Start: 2024-03-29 — End: 2024-03-29
  Filled 2024-03-29: qty 1

## 2024-03-29 MED ORDER — FREE WATER: 100.0000 mL | Freq: Four times a day (QID) | Status: DC

## 2024-03-29 MED ORDER — POLYETHYLENE GLYCOL 3350 17 G PO PACK
17.0000 g | PACK | Freq: Every day | ORAL | Status: DC
  Administered 2024-03-29: 17 g via ORAL
  Filled 2024-03-29: qty 1

## 2024-03-29 MED ORDER — DOCUSATE SODIUM 50 MG/5ML PO LIQD: 100.0000 mg | Freq: Every day | ORAL | Status: DC | PRN

## 2024-03-29 MED ORDER — OXYBUTYNIN CHLORIDE ER 5 MG PO TB24: 5.0000 mg | ORAL_TABLET | Freq: Every day | ORAL | Status: DC

## 2024-03-29 MED ORDER — DOCUSATE SODIUM 50 MG PO CAPS
50.0000 mg | ORAL_CAPSULE | Freq: Once | ORAL | Status: AC
  Administered 2024-03-29: 50 mg via ORAL
  Filled 2024-03-29: qty 1

## 2024-03-29 MED ORDER — OXYCODONE HCL 5 MG PO CAPS: 5.0000 mg | ORAL_CAPSULE | Freq: Four times a day (QID) | ORAL | Status: DC | PRN

## 2024-03-29 NOTE — ED Provider Notes (Signed)
 Patient's group home is excepting him back.  Will go ahead and discharge and resend his IVC.   Nolia Tschantz, MD 03/29/24 1145

## 2024-03-29 NOTE — ED Notes (Signed)
 Patient has taken meds willingly this morning

## 2024-03-29 NOTE — ED Notes (Signed)
 [facility] called for transport to [facility]
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
## 2024-03-29 NOTE — Discharge Instructions (Signed)
 Patient accepted back to group home.  Patient clear for discharge.

## 2024-03-29 NOTE — ED Notes (Signed)
 Talked to [facility] Nursing Manager    she said patient was ok to return to [facility] today   she was advised that he would be transported vis [id number]
